# Patient Record
Sex: Female | Born: 1980 | Race: Black or African American | Hispanic: No | Marital: Married | Smoking: Never smoker
Health system: Southern US, Community
[De-identification: ages and names within clinical notes are randomized; demographics above are authoritative.]

## PROBLEM LIST (undated history)

## (undated) DIAGNOSIS — Z9889 Other specified postprocedural states: Secondary | ICD-10-CM

## (undated) DIAGNOSIS — R110 Nausea: Secondary | ICD-10-CM

## (undated) DIAGNOSIS — Q039 Congenital hydrocephalus, unspecified: Secondary | ICD-10-CM

## (undated) DIAGNOSIS — IMO0002 Reserved for concepts with insufficient information to code with codable children: Principal | ICD-10-CM

## (undated) DIAGNOSIS — D571 Sickle-cell disease without crisis: Secondary | ICD-10-CM

## (undated) DIAGNOSIS — R87619 Unspecified abnormal cytological findings in specimens from cervix uteri: Secondary | ICD-10-CM

## (undated) DIAGNOSIS — Z3490 Encounter for supervision of normal pregnancy, unspecified, unspecified trimester: Principal | ICD-10-CM

## (undated) DIAGNOSIS — Z86718 Personal history of other venous thrombosis and embolism: Secondary | ICD-10-CM

## (undated) DIAGNOSIS — K219 Gastro-esophageal reflux disease without esophagitis: Secondary | ICD-10-CM

## (undated) DIAGNOSIS — O26851 Spotting complicating pregnancy, first trimester: Secondary | ICD-10-CM

## (undated) DIAGNOSIS — D649 Anemia, unspecified: Secondary | ICD-10-CM

## (undated) DIAGNOSIS — R112 Nausea with vomiting, unspecified: Secondary | ICD-10-CM

## (undated) DIAGNOSIS — I10 Essential (primary) hypertension: Secondary | ICD-10-CM

## (undated) DIAGNOSIS — R87629 Unspecified abnormal cytological findings in specimens from vagina: Secondary | ICD-10-CM

## (undated) DIAGNOSIS — N809 Endometriosis, unspecified: Secondary | ICD-10-CM

## (undated) HISTORY — DX: Reserved for concepts with insufficient information to code with codable children: IMO0002

## (undated) HISTORY — DX: Unspecified abnormal cytological findings in specimens from vagina: R87.629

## (undated) HISTORY — DX: Unspecified abnormal cytological findings in specimens from cervix uteri: R87.619

## (undated) HISTORY — DX: Nausea: R11.0

## (undated) HISTORY — PX: CSF SHUNT: SHX92

## (undated) HISTORY — DX: Endometriosis, unspecified: N80.9

## (undated) HISTORY — DX: Encounter for supervision of normal pregnancy, unspecified, unspecified trimester: Z34.90

## (undated) HISTORY — DX: Sickle-cell disease without crisis: D57.1

## (undated) HISTORY — PX: BLADDER TUMOR EXCISION: SHX238

## (undated) HISTORY — DX: Spotting complicating pregnancy, first trimester: O26.851

## (undated) HISTORY — DX: Congenital hydrocephalus, unspecified: Q03.9

---

## 2004-06-13 ENCOUNTER — Ambulatory Visit (HOSPITAL_COMMUNITY): Admission: AD | Admit: 2004-06-13 | Discharge: 2004-06-13 | Payer: Self-pay | Admitting: Obstetrics & Gynecology

## 2004-08-05 ENCOUNTER — Ambulatory Visit (HOSPITAL_COMMUNITY): Admission: RE | Admit: 2004-08-05 | Discharge: 2004-08-05 | Payer: Self-pay | Admitting: Obstetrics & Gynecology

## 2004-08-11 ENCOUNTER — Ambulatory Visit: Payer: Self-pay | Admitting: *Deleted

## 2004-08-12 ENCOUNTER — Ambulatory Visit (HOSPITAL_COMMUNITY): Admission: RE | Admit: 2004-08-12 | Discharge: 2004-08-12 | Payer: Self-pay | Admitting: Obstetrics & Gynecology

## 2004-08-16 ENCOUNTER — Inpatient Hospital Stay (HOSPITAL_COMMUNITY): Admission: AD | Admit: 2004-08-16 | Discharge: 2004-08-19 | Payer: Self-pay | Admitting: Obstetrics and Gynecology

## 2006-10-12 DIAGNOSIS — Z86718 Personal history of other venous thrombosis and embolism: Secondary | ICD-10-CM

## 2006-10-12 HISTORY — DX: Personal history of other venous thrombosis and embolism: Z86.718

## 2007-03-19 ENCOUNTER — Emergency Department (HOSPITAL_COMMUNITY): Admission: EM | Admit: 2007-03-19 | Discharge: 2007-03-19 | Payer: Self-pay | Admitting: Emergency Medicine

## 2007-03-30 ENCOUNTER — Ambulatory Visit (HOSPITAL_COMMUNITY): Admission: RE | Admit: 2007-03-30 | Discharge: 2007-03-30 | Payer: Self-pay | Admitting: Obstetrics and Gynecology

## 2007-04-29 ENCOUNTER — Encounter (INDEPENDENT_AMBULATORY_CARE_PROVIDER_SITE_OTHER): Payer: Self-pay | Admitting: Urology

## 2007-04-29 ENCOUNTER — Observation Stay (HOSPITAL_COMMUNITY): Admission: RE | Admit: 2007-04-29 | Discharge: 2007-04-30 | Payer: Self-pay | Admitting: Urology

## 2007-05-04 ENCOUNTER — Emergency Department (HOSPITAL_COMMUNITY): Admission: EM | Admit: 2007-05-04 | Discharge: 2007-05-04 | Payer: Self-pay | Admitting: Emergency Medicine

## 2007-05-05 ENCOUNTER — Observation Stay (HOSPITAL_COMMUNITY): Admission: RE | Admit: 2007-05-05 | Discharge: 2007-05-06 | Payer: Self-pay | Admitting: Emergency Medicine

## 2007-05-07 ENCOUNTER — Inpatient Hospital Stay (HOSPITAL_COMMUNITY): Admission: EM | Admit: 2007-05-07 | Discharge: 2007-05-14 | Payer: Self-pay | Admitting: Emergency Medicine

## 2007-05-07 ENCOUNTER — Other Ambulatory Visit: Payer: Self-pay | Admitting: Emergency Medicine

## 2007-05-07 ENCOUNTER — Emergency Department (HOSPITAL_COMMUNITY): Admission: EM | Admit: 2007-05-07 | Discharge: 2007-05-07 | Payer: Self-pay | Admitting: Emergency Medicine

## 2007-05-20 ENCOUNTER — Observation Stay (HOSPITAL_COMMUNITY): Admission: EM | Admit: 2007-05-20 | Discharge: 2007-05-23 | Payer: Self-pay | Admitting: Emergency Medicine

## 2007-05-23 ENCOUNTER — Ambulatory Visit: Payer: Self-pay | Admitting: Vascular Surgery

## 2007-07-12 ENCOUNTER — Encounter (HOSPITAL_COMMUNITY): Admission: RE | Admit: 2007-07-12 | Discharge: 2007-07-12 | Payer: Self-pay | Admitting: Oncology

## 2007-07-12 ENCOUNTER — Ambulatory Visit (HOSPITAL_COMMUNITY): Payer: Self-pay | Admitting: Oncology

## 2007-09-12 ENCOUNTER — Ambulatory Visit (HOSPITAL_COMMUNITY): Payer: Self-pay | Admitting: Oncology

## 2007-09-12 ENCOUNTER — Encounter (HOSPITAL_COMMUNITY): Admission: RE | Admit: 2007-09-12 | Discharge: 2007-10-12 | Payer: Self-pay | Admitting: Oncology

## 2008-03-14 ENCOUNTER — Ambulatory Visit (HOSPITAL_COMMUNITY): Admission: RE | Admit: 2008-03-14 | Discharge: 2008-03-14 | Payer: Self-pay | Admitting: Obstetrics and Gynecology

## 2008-07-28 IMAGING — CT CT ABDOMEN W/ CM
2 of 5 series · 15 of 46 positions shown, 17 images · IV contrast (Omnipaque 300)
Comparison: none

HISTORY: Bladder mass, mid and lower pelvic pain, vomiting

[Series 2: abd_pel 5.0 b40f · axial · 0.78mm/px · z∈[-426,-40]mm · 12 of 93 slices shown, 14 images]
[im 8/93  soft-tissue]
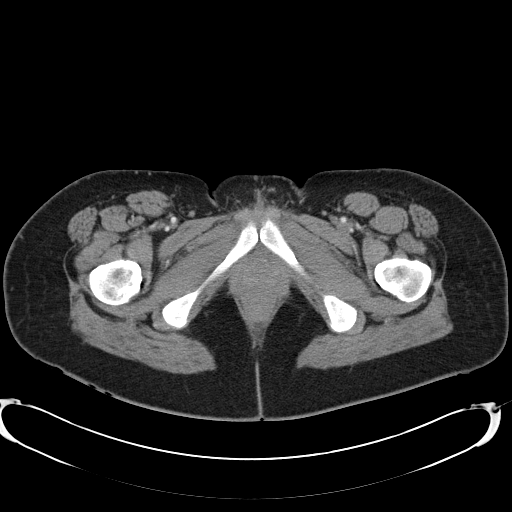
[im 8/93  bone]
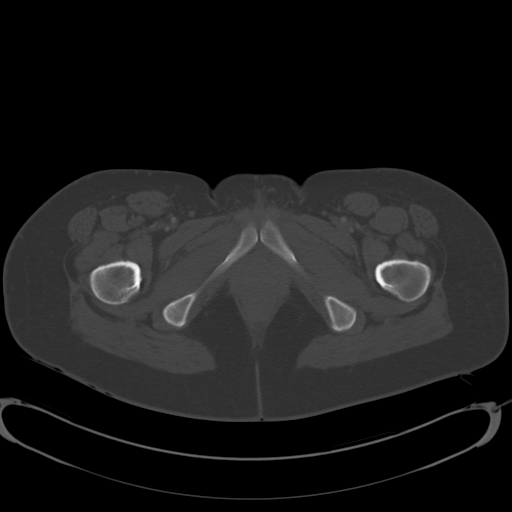
[im 15/93  soft-tissue]
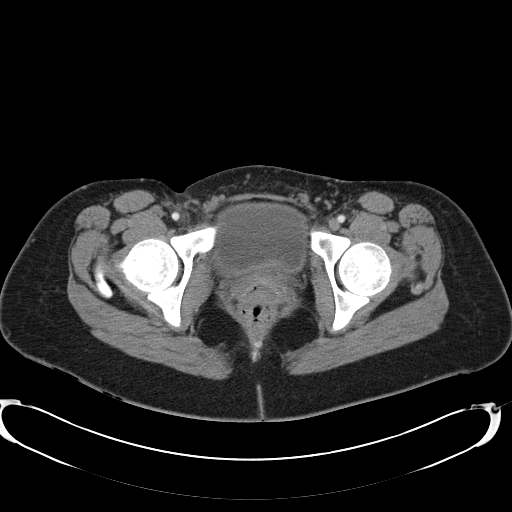
[im 22/93  soft-tissue]
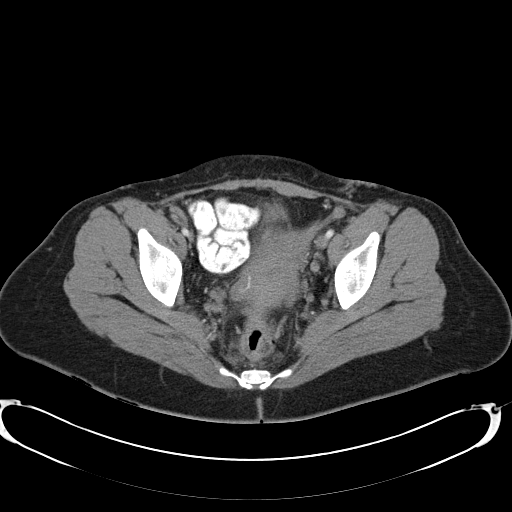
[im 29/93  soft-tissue]
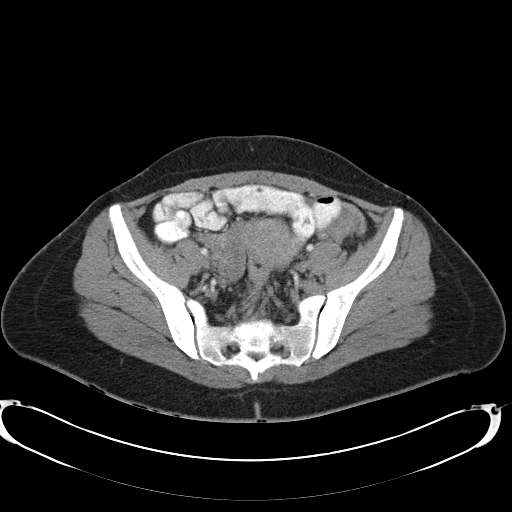
[im 36/93  soft-tissue]
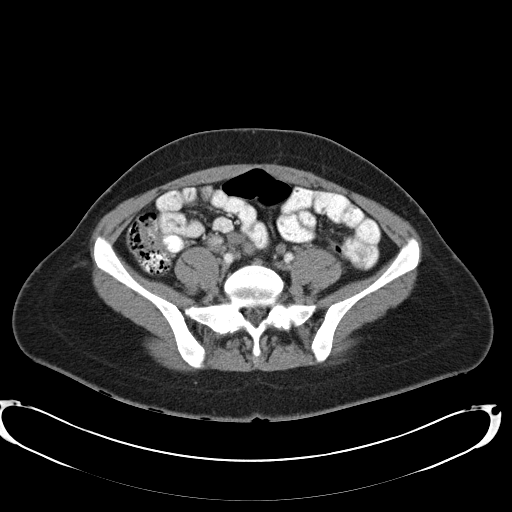
[im 43/93  soft-tissue]
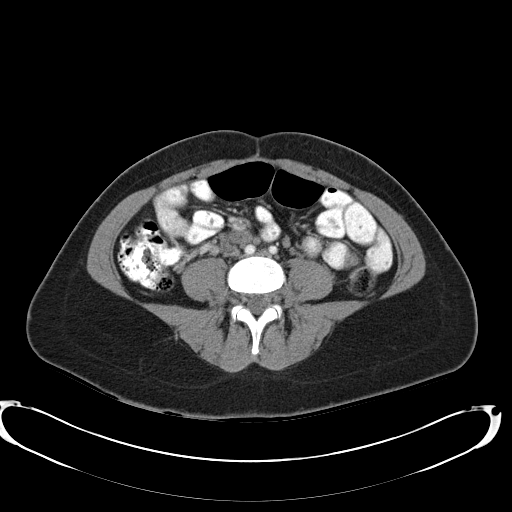
[im 50/93  soft-tissue]
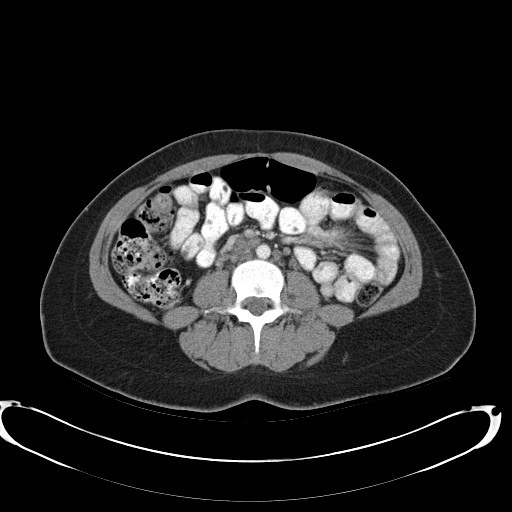
[im 57/93  soft-tissue]
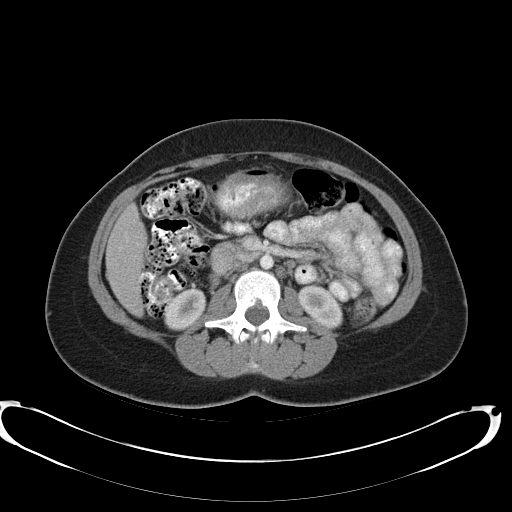
[im 64/93  soft-tissue]
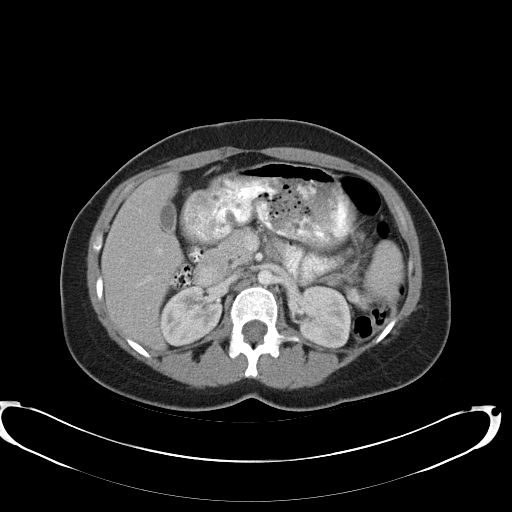
[im 64/93  bone]
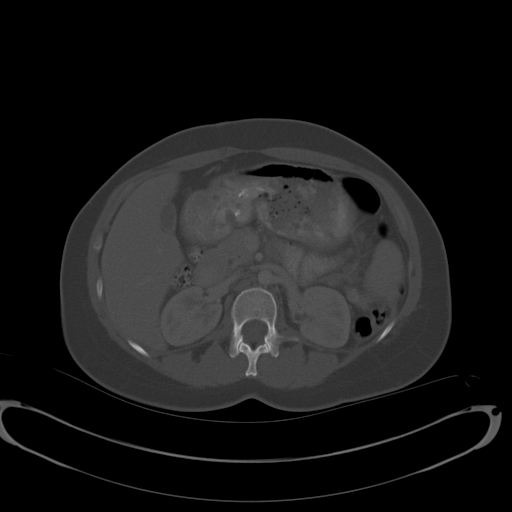
[im 71/93  soft-tissue]
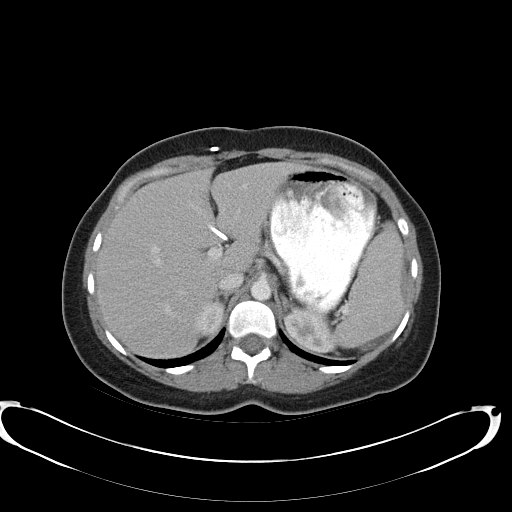
[im 78/93  soft-tissue]
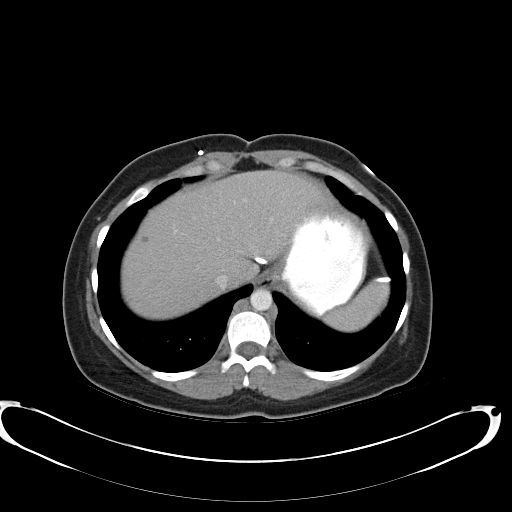
[im 85/93  soft-tissue]
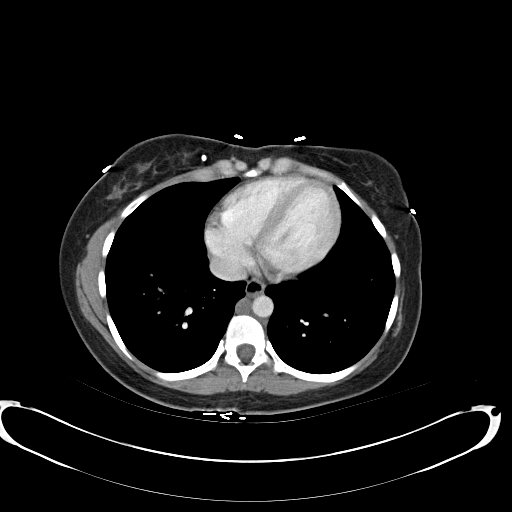

[Series 4: mpr coronal a/p · coronal · 0.62mm/px · 3 of 73 slices shown]
[im 25/73  soft-tissue]
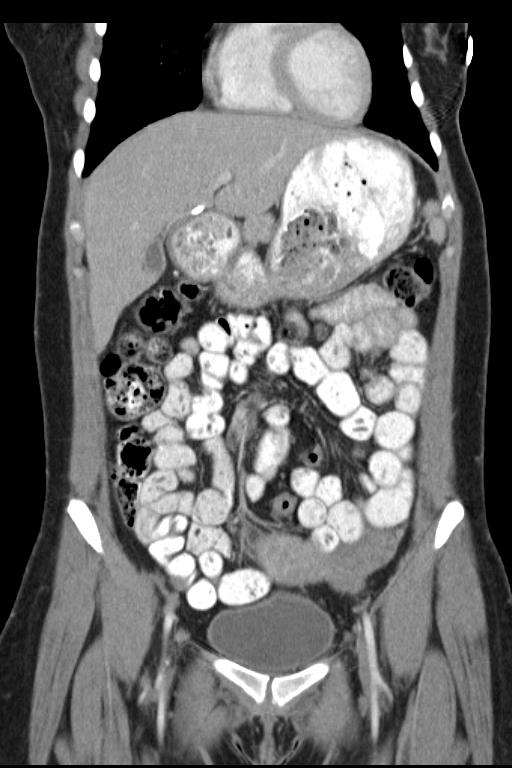
[im 33/73  soft-tissue]
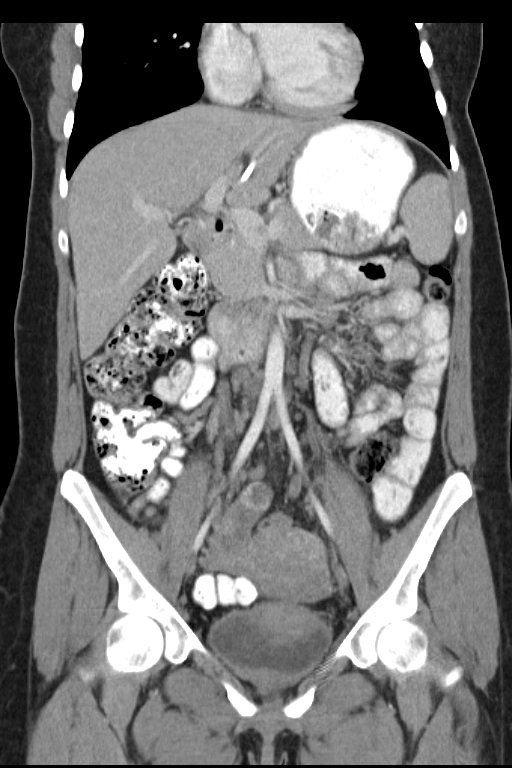
[im 41/73  soft-tissue]
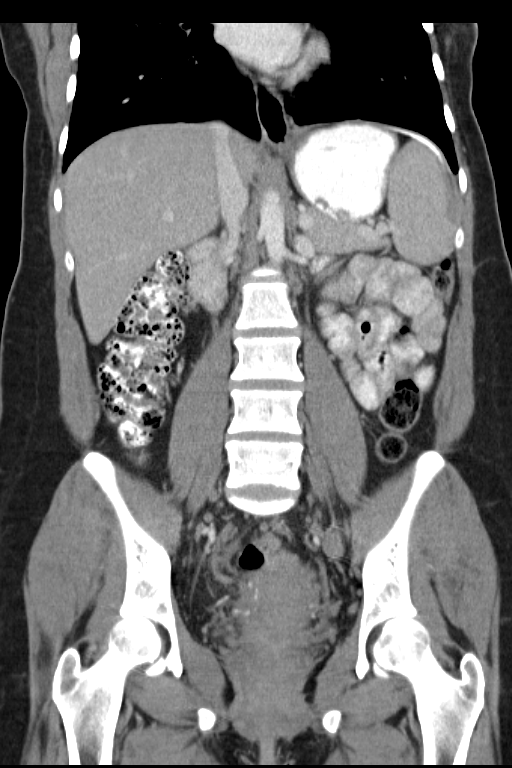

[15 of 46 positions shown; findings below may reference images not displayed]

CT ABDOMEN AND PELVIS WITH CONTRAST:

Multidetector helical CT imaging abdomen and pelvis performed.
Sagittal and coronal images are reconstructed from the axial data set.
Exam utilized dilute oral contrast and 100 cc 4mnipaque-Q66.
No prior exam for comparison.

CT ABDOMEN:

Tubing traverses right thorax extent and abdomen, terminating in left
subdiaphragmatic space, question VP shunt.
Lung bases clear.
Tiny nonspecific low attenuation focus liver, 5 mm diameter image 16, too small
to characterize.
Remainder of liver, spleen, pancreas, kidneys, and adrenal glands normal.
Question mild retrocrural adenopathy image 19.
Numerous but normal sized left periaortic and pericaval lymph nodes identified,
single node mildly enlarge 16 x 10 mm image 48.
No upper abdominal mass, free fluid, or inflammatory process.
Stomach and upper abdominal bowel loops unremarkable.
IMPRESSION: Prominent retroperitoneal and suspect retrocrural nodes, see below.
No other upper abdominal abnormalities, see below.

CT PELVIS:

Ovaries mildly prominent in size bilaterally.
Ring-enhancing lesion in right ovary 2.2 x 2.4 cm, likely ruptured follicle
cyst.
Minimal prominence of uterus.
Soft tissue mass identified at posterior aspect of urinary bladder, superior to
mid portions, measuring 3.5 x 3.2 x 2.6 cm.
On delayed images of the bladder, mass has irregular papillary projections or
markedly irregular contour extending into bladder lumen.
Mass appears to have an epicenter at posterior wall of bladder.
No free pelvic fluid.
Few prominent lymph nodes, including left iliac node 18 x 15 mm image 66
anterior to SI joint.
Right paracaval adenopathy extends inferiorly in pelvis to the right ovary.
Mildly prominent inguinal vessels without discrete inguinal adenopathy.
Pelvic large and small bowel loops unremarkable. 
Appendix appears normal. 
No bone lesions.
IMPRESSION: Posterior bladder wall mass 3.5 cm in greatest size, which extends intraluminal
at posterior bladder.
Favor this representing transitional cell carcinoma of the urinary bladder.
The presence of adenopathy in retroperitoneum and left pelvis makes
endometriosis less likely.
Other etiologies would include a stromal tumor of uterus, which is contiguous
with this mass, though the epicenter does not appear to be uterine I think this
is unlikely.
No definite features seen to suggest an ovarian carcinoma.
Tissue diagnosis recommended.

## 2009-07-13 ENCOUNTER — Inpatient Hospital Stay (HOSPITAL_COMMUNITY): Admission: AD | Admit: 2009-07-13 | Discharge: 2009-07-13 | Payer: Self-pay | Admitting: Obstetrics & Gynecology

## 2010-11-02 ENCOUNTER — Encounter: Payer: Self-pay | Admitting: Family Medicine

## 2011-01-15 LAB — WET PREP, GENITAL: Clue Cells Wet Prep HPF POC: NONE SEEN

## 2011-01-15 LAB — POCT PREGNANCY, URINE: Preg Test, Ur: NEGATIVE

## 2011-02-24 NOTE — Group Therapy Note (Signed)
NAMEESRAA, SERES    ACCOUNT NO.:  [id number]   MEDICAL RECORD NO.:  [id number]          PATIENT TYPE:  INP   LOCATION:  A302                          FACILITY:  [facility]   PHYSICIAN:  Dorris Singh, DO    DATE OF BIRTH:  1981/02/01   DATE OF PROCEDURE:  05/11/2007  DATE OF DISCHARGE:                                 PROGRESS NOTE   The patient is a 30 year old African-American female who presented with  a history of hematuria after having a bladder tumor removed.  The  patient stated prior to this she had several days of resolved hematuria  and early last evening she started to have increasing episodes of  hematuria and now today after they placed a Foley they noted that now  she has continual hematuria.  She denies any nausea, vomiting, shortness  of breath or any chest pain or headache.   PHYSICAL EXAMINATION:  VITALS:  Temperature 98.8, pulse 68, respirations  20, blood pressure 103/62.  GENERAL:  This is a 30 year old female who is well-nourished, well-  developed in no acute distress. Answers questions appropriately.  HEART:  Regular rate and rhythm.  No murmur or gallop or rub noted.  S1  and S2 appreciated.  LUNGS:  Clear to auscultation bilaterally.  Respirations are  symmetrical.  No wheezes, rales or rhonchi.  ABDOMEN:  Soft, nontender, nondistended.  Appropriate tenderness over  bladder region.  EXTREMITIES:  Positive pulses.  No ecchymoses, cyanosis or edema noted.   LABORATORY DATA:  A CBC today; WBC is 7.9, hemoglobin 13.1, hematocrit  38.4, platelets 280 and her INR is 1.4.   ASSESSMENT/PLAN:  Hematuria that has recurred.  Spoke with Dr. Dennie Maizes today regarding her recurrence.  Surgeon will come and see the  patient today. Also will hold all anticoagulation therapy until she is  seen and actually await further recommendations via urology for any  further treatment options.      Dorris Singh, DO  Electronically Signed     CB/MEDQ  D:   05/11/2007  T:  05/12/2007  Job:  [id number]

## 2011-02-24 NOTE — Consult Note (Signed)
Sarah Cross, Sarah Cross    ACCOUNT NO.:  [id number]   MEDICAL RECORD NO.:  [id number]          PATIENT TYPE:  INP   LOCATION:  A302                          FACILITY:  [facility]   PHYSICIAN:  Dennie Maizes, M.D.   DATE OF BIRTH:  November 27, 1980   DATE OF CONSULTATION:  05/07/2007  DATE OF DISCHARGE:                                 CONSULTATION   REASON FOR CONSULTATION:  Hematuria.   CONSULTATION REPORT:  This 30 year old female has been under the care of  Dr.Javaid.  She is diagnosed with a bladder tumor.  A TUR of a bladder  tumor was done on April 29, 2007.  The pathology revealed endometriosis.  There was no evidence of malignancy.  The patient came to the emergency  room on May 05, 2007 with right leg pain.  Evaluation revealed deep  venous thrombosis.  The patient was started on Lovenox,  and she has  been admitted to the hospital.  She was discharged from the hospital  yesterday.  She returned to the hospital today complaining of hematuria  and passing blood clots in the urine.  Denies voiding difficulty, flank  pain, fever, chills or dysuria.  She has been admitted to the hospital  by the Hospitalist.  I was asked to see the patient regarding the  hematuria.   PAST MEDICAL HISTORY:  Multiple surgeries. ; C-section. Status post TUR  of bladder tumor on April 29, 2007.  </MEDICATIONS Coumadin.   ALLERGIES:  None.   PHYSICAL EXAMINATION:  ABDOMEN:  Soft, no palpable masses, tender,  bladder is not palpable.   ADMISSION LABS:  BUN 7, creatinine 0.79. PTT 30.  CBC:  WBC 7.1  thousand, hemoglobin 15.8, hematocrit 43.9.  Urinalysis:  blood large,  nitrate negative, trace leukocyte estrace; microscopic wbc's 7-10/hpf,  rbc's too numerous to count, bacteria few.   IMPRESSION:  1. Hematuria probably secondary to anticoagulation.  2. Bladder tumor.  3. Endometriosis, status post TUR of bladder tumor.   PLAN:  1. Urine culture and sensitivity today.  2. Start patient on IV  heparin.  3. If patient develops a voiding difficulty, she may need      catheterization and bladder irrigation.   Thanks for this consult.      Dennie Maizes, M.D.  Electronically Signed     SK/MEDQ  D:  05/07/2007  T:  05/07/2007  Job:  [id number]   cc:   Corrie Mckusick, M.D.  Fax: [phone]

## 2011-02-24 NOTE — Discharge Summary (Signed)
NAMEJELENA, MALICOAT NO.:  [id number]   MEDICAL RECORD NO.:  [id number]          PATIENT TYPE:  OBV   LOCATION:  5033                         FACILITY:  [facility]   PHYSICIAN:  Hillery Aldo, M.D.   DATE OF BIRTH:  [birth year]   DATE OF ADMISSION:  05/20/2007  DATE OF DISCHARGE:  05/23/2007                               DISCHARGE SUMMARY   PRIMARY CARE PHYSICIAN:  Corrie Mckusick, M.D., with the [facility] in [place].   UROLOGIST:  Ky Barban, M.D.   DISCHARGE DIAGNOSES:  1. 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 Hematuria secondary to bladder hemorrhage in the setting of      anticoagulation, resolved off anticoagulation.  3. Endometriosis.  4. Hydrocephalus with chronic ventriculoperitoneal shunt.   DISCHARGE MEDICATIONS:  None.   CONSULTATIONS:  Janetta Hora. Fields, MD, vascular surgery.   BRIEF ADMISSION HPI:  For the full details, please see my dictated  report.  Briefly, this is a 30 year old female who presented to the  hospital for consideration of placement of a Greenfield filter, after a  DVT was discovered post surgically.  The patient had a bladder mass  resected back in July 2008.  She failed outpatient as well as inpatient  treatment with anticoagulation, secondary to recurrent bladder  hemorrhages.  Her primary care physician had her sent to [facility] for evaluation of possible placement of a Greenfield filter.   PROCEDURES AND DIAGNOSTIC STUDIES:  1. Venogram on May 22, 2007 showed chronic occlusion of the IVC,      with a right lower extremity DVT.  There is no need for IVC filter      placement.  2. CT scan of the chest, abdomen and pelvis on May 22, 2007 showed      a prominent azygous system.  There was no obvious IVC clot in the      chest or upper abdomen.  There were no acute pulmonary findings.      There was no  adenopathy.  There was an extensive network of      collateral vasculature getting around the IVC occlusion, with no      masses or adenopathy in the abdomen.  A ventriculoperitoneal shunt      catheter was noted in the abdomen.  Findings in the pelvis included      an abnormal enhancement of the posterior bladder wall.  This is a      somewhat worrisome finding and a follow-up cystoscopic evaluation      was recommended.  This specific finding was discussed with the      radiologist, who did indicate that endometriosis could have this      appearance.  Nevertheless, he did recommend follow-up cystoscopy at      some point in the future.  There was a simple appearing right renal      cyst and a more solid appearing lateral right probable ovarian      lesion.  Further characterization can be accomplished with an  outpatient MRI of the pelvis, if indicated.  There was extensive      collateral vessels because of the IVC iliac and right femoral      occlusions.  There was no obstructing mass or adenopathy noted.   DISCHARGE LABORATORY VALUES:  At the time of this dictation, a  hypercoagulability panel is pending.  Her homocystine level was within  normal limits.  Her thyroid stimulating hormone level was within normal  limits.   HOSPITAL COURSE BY PROBLEM:  PROBLEM #1 - CHRONIC DEEP VENOUS  THROMBOSIS, AFFECTING THE INFERIOR VENA CAVA AND FEMORAL VESSELS:  The  patient did undergo a venogram study to determine if placement of an IVC  filter would be feasible.  Unfortunately, there was extensive clot  affecting the entire IVC, and no indication for filter was found due to  his extensive clot.  Dr. Darrick Penna of vascular surgery was asked to review  the films and the venogram findings, to determine if any further course  of action was necessary.  The patient is not a candidate for recurrent  anticoagulation at this time, due to recurrent bladder hemorrhages.  A  thrombectomy also is not  indicated due to the extensive nature of the  clot and the fact that she has extensive collateral vessels, and that  this clot is likely stable.  Consideration for rechallenging with  outpatient anticoagulation can be done at a later date.   PROBLEM #2 - HEMATURIA SECONDARY TO BLADDER HEMORRHAGE IN THE SETTING OF  ANTICOAGULATION:  This is completely resolved off all anticoagulation.   PROBLEM #3 - ENDOMETRIOSIS WITH ABNORMAL ENHANCEMENT OF THE POSTERIOR  BLADDER WALL:  (seen on CT) We do recommend a follow-up cystoscopy as an  outpatient.   PROBLEM #4 -  RIGHT OVARIAN LESION:  Would recommend follow-up in 3-6  months of this lesion to ensure stability   PROBLEM #5 -  HYDROCEPHALUS WITH VENTRICULOPERITONEAL SHUNT:  Chronic  problem.  No active issues related to this.   DISPOSITION:  The patient is stable for discharge home.  She should  follow up with her primary care physician and be referred back to her  urologist for cystoscopy as an outpatient      Hillery Aldo, M.D.  Electronically Signed     CR/MEDQ  D:  05/23/2007  T:  05/24/2007  Job:  [id number]   cc:   Corrie Mckusick, M.D.  Ky Barban, M.D.

## 2011-02-24 NOTE — H&P (Signed)
Sarah Cross, Sarah Cross   ACCOUNT NO.:  [id number]   MEDICAL RECORD NO.:  [id number]          PATIENT TYPE:  AMB   LOCATION:  DAY                           FACILITY:  [facility]   PHYSICIAN:  Ky Barban, M.D.DATE OF BIRTH:  [birth year]   DATE OF ADMISSION:  DATE OF DISCHARGE:  LH                              HISTORY & PHYSICAL   CHIEF COMPLAINT:  Bladder tumor.   HISTORY OF PRESENT ILLNESS:  The patient is a 30 year old female who was  having dysuria during her menstrual period and she went to see Dr.  Emelda Fear who did a pelvic ultrasound. There is a mass in the bladder. A  CT scan showed there is a 3.5 x 3.2 cm mass in the posterior aspect of  the urinary bladder. Cystoscopy was recommended and on cystoscopy there  was a solid papillary growth in the bladder on the posterior bladder  wall. It looks like transitional-cell carcinoma. I have advised the  patient to undergo TUR bladder tumor. The procedure, its risks,  complications, especially bladder perforation, injury to the bowel and  need for additional surgery were discussed in detail. They understand  and want me to go ahead and schedule.   PAST MEDICAL HISTORY:  She has ventriculoperitoneal shunt. According to  the family it was done when she was born. The second shunt was placed  when she was age 30. She denies having gross hematuria but does have  dysuria when she is having menstrual period.   PAST SURGICAL HISTORY:  She had Cesarean section 2005. Also had  ventriculoperitoneal shunt done twice; first time at birth and the  second time at age 32.   MEDICATIONS:  Hydrocodone.   REASON FOR ADMISSION:  Unremarkable.   PHYSICAL EXAMINATION:  GENERAL APPEARANCE: Moderately-built female. Not  a good historian.  VITAL SIGNS: Blood pressure is 128/86, temperature is 98.6.  CENTRAL NERVOUS SYSTEM: No gross neurological deficit.  HEENT: Head and neck are essentially negative.  CHEST: Symmetrical.  CARDIAC: Regular  sinus rhythm, no murmur.  ABDOMEN: Soft, flat, liver, spleen, kidneys are not palpable. No CVA  tenderness.  PELVIC: On exam, no adnexal mass.  There is some tenderness anteriorly  in the area of the bladder.   IMPRESSION:  Bladder tumor.   PLAN:  TUR of bladder tumor under anesthesia, and then keep her for  observation.      Ky Barban, M.D.  Electronically Signed     MIJ/MEDQ  D:  04/28/2007  T:  04/28/2007  Job:  [id number]   cc:   Tilda Burrow, M.D.  Fax: [phone]

## 2011-02-24 NOTE — H&P (Signed)
Sarah Cross, Sarah Cross   ACCOUNT NO.:  [id number]   MEDICAL RECORD NO.:  [id number]          PATIENT TYPE:  OBV   LOCATION:  5033                         FACILITY:  [facility]   PHYSICIAN:  Hillery Aldo, M.D.   DATE OF BIRTH:  Sep 14, 1981   DATE OF ADMISSION:  05/20/2007  DATE OF DISCHARGE:                              HISTORY & PHYSICAL   PRIMARY CARE PHYSICIAN:  Dr. Phillips Odor with [facility] in  [place].   CHIEF COMPLAINT:  Leg pain.   HISTORY OF PRESENT ILLNESS:  The patient is a 30 year old female whose  history of present illness dates back to April 29, 2007, when she was  admitted for an elective bladder resection secondary to evaluation of a  bladder mass.  The pathology on the tumor subsequently revealed  endometriosis.  She was discharged after her surgery, and approximately  2 days later, developed right lower extremity swelling and pain.  A  Doppler ultrasound did reveal a deep venous thrombosis, and she was  therefore put on therapeutic anticoagulation with Lovenox and Coumadin.  Unfortunately, the patient did have an excessive dose of Lovenox given  to her, due to a clerical error and transcribing her weight as being 135  kg instead of pounds.  She was discharged and advised to skip a dose of  Lovenox but subsequently developed significant hematuria.  She  represented to the hospital and was admitted, and attempts to  rechallenge her with heparin induced recurrent episodes of hematuria.  She was ultimately discharged from the hospital on May 15, 2007, off  of oral therapeutic anticoagulation.  She was instructed to follow-up  with her primary care physician, who advised her to come to the [facility] emergency department for further evaluation.  Upon presentation  here, her main complaint was ongoing right lower extremity pain, and  there was apparently some mention of placement of Greenfield filter by  her primary care physician.  The interventional  radiologist was  subsequently contacted by the emergency department physician who  reviewed her CT scan, and ultrasound studies and he was not completely  sure if it would be technically feasible to place a Greenfield filter in  without first obtaining a venogram due to concerns of occlusion of the  IVC.  The patient therefore is being admitted to sort these issues out  with a scheduled venogram in the morning and subsequent consideration of  a Greenfield filter placement if technically feasible.   PAST MEDICAL HISTORY:  1. Placement of continuous lumbar epidural analgesia, L2-L3 interspace      of November 2005.  2. Anxiety.  3. History of premature birth weighing 3 pounds at birth with      prolonged antenatal hospitalization and multiple procedures      including placement of a VP shunt.  4. Revision of the VP shunt at age 64.  5. Hydrocephalus.  6. Status post bladder resection with subsequent diagnosis of      endometriosis.  7. Status post cesarean section in 2005.   FAMILY HISTORY:  The patient's mother died at 59 of lung cancer.  She  was a tobacco user.  The patient's father is alive  and is age 23.  He  has hypertension.  She has half siblings who are healthy.  Her son is  healthy.  There is no family history of blood clots in the immediate  family, but she did have a maternal grandmother who suffered with a  blood clot, during a prolonged hospitalization.   SOCIAL HISTORY:  The patient is married and has one son.  She is a  lifelong nonsmoker.  She denies tobacco or alcohol use.  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   ALLERGIES:  NONE.   CURRENT MEDICATIONS:  None.  Specifically, she denies any use of hormone  treatment or birth control.   REVIEW OF SYSTEMS:  The patient denies any fever or chills.  She denies  shortness of breath, cough, chest pain.  She denies any changes in her  bowel habits, melena or hematochezia.  She denies any current  hematuria.  Her main complaints are as per the HPI, with regard to right lower  extremity pain and swelling.   PHYSICAL EXAM:  VITAL SIGNS:  Temperature 98.5, pulse 72, respirations  18, blood pressure 110/75, O2 saturation 100% on room air.  GENERAL:  This is an anxious female who is in no acute distress.  HEENT:  Normocephalic, atraumatic.  PERRL.  EOMI.  Oropharynx is clear.  NECK:  Supple, no thyromegaly, no lymphadenopathy, no jugular venous  distension.  CHEST:  Lungs clear to auscultation bilaterally with good air movement.  HEART:  Regular rate, rhythm.  No murmurs, rubs, or gallops.  ABDOMEN:  Soft, nontender, nondistended with normoactive bowel sounds.  EXTREMITIES:  The patient has some swelling to the right lower  extremity, when compared to the left.  SKIN:  Warm and dry.  No rashes.  NEUROLOGIC:  Nonfocal.   DATA REVIEW:  No new labs have been obtained, but a CBC on May 14, 2007 did reveal a hemoglobin of 12.6, hematocrit of 36.5, white blood  cell count of 7.6, and platelet count 291.   ASSESSMENT/PLAN:  1. Right lower extremity deep venous thrombosis with recurrent      hematuria on anticoagulation:  The patient is currently scheduled      for a venogram in the morning, to see if it would be technically      feasible to have a Greenfield filter placed.  At this point, given      her recurrent episodes of severe hematuria, I will defer      anticoagulation.  It might be prudent to obtain a      hypercoagulability screen on this patient, as a long-term      management of clot risk will need to be addressed..  2. Prophylaxis:  Will initiate GI prophylaxis with Protonix.  The      patient has an active deep venous thrombosis, and therefore      prevention is not possible.      Hillery Aldo, M.D.  Electronically Signed     CR/MEDQ  D:  05/20/2007  T:  05/22/2007  Job:  [id number]   cc:   Corrie Mckusick, M.D.

## 2011-02-24 NOTE — H&P (Signed)
Sarah Cross, LEEVER NO.:  [id number]   MEDICAL RECORD NO.:  [id number]          PATIENT TYPE:  EMS   LOCATION:  ED                            FACILITY:  [facility]   PHYSICIAN:  Osvaldo Shipper, MD     DATE OF BIRTH:  June 01, 1981   DATE OF ADMISSION:  05/07/2007  DATE OF DISCHARGE:  LH                              HISTORY & PHYSICAL   PRIMARY MEDICAL DOCTOR:  Dr. Assunta Found.   ADMITTING DIAGNOSES:  1. Hematuria.  2. Recent acute deep venous thrombosis.  3. On anticoagulation.   CHIEF COMPLAINT:  Blood in the urine.   HISTORY OF PRESENTING ILLNESS:  The patient is a 30 year old, African-  American female, who was actually discharged just yesterday after she  was initiated on treatment for a DVT of the right lower extremity.  As  noted in the Discharge Summary from yesterday, she was inadvertently  given a higher than usual dose of Lovenox because of clinical error  regarding her weight.  The patient was asked to skip last night's dose,  which she did.  The patient came into the ED last night because she  started noticing blood in her urine yesterday night at about 8 o'clock.  Patient was discharged home from the ED after a PT-INR was checked,  which was normal, and a UA did show large blood and numerous RBCs.  I  got a call from the patient this morning saying that she was having  frank hematuria with blood clots from the urine.  Denied any vaginal  bleeding.  She denied any pain, nausea, vomiting, no chest pain, no  shortness of breath.  She denied any pain in her right leg as well.  It  should be noted that patient underwent a urological procedure about a  week ago done by Dr. Jerre Simon and had the removal of a bladder tumor.   DISCHARGE HOME MEDICATIONS:  1. Lovenox 65 mg q.12.  2. Coumadin 5 mg daily.  As mentioned above, she has not taken the Lovenox since she has been  home as she was instructed.   ALLERGIES:  No known drug allergies.   PAST MEDICAL  HISTORY:  1. Multiple surgeries as a child.  She has had a repeat shunt placed      as a child and numerous other surgeries.  2. History of C-section.  3. History of bladder tumor removal about eight days ago.  No history of any other medical problems.   SOCIAL HISTORY:  Lives in [place] with her husband and children,  works in Education officer, museum, denies smoking, alcohol, or illicit drug  use.   FAMILY HISTORY:  Positive for hypertension, father lung cancer, mother a  history of diabetes and end-stage renal disease in the family.   REVIEW OF SYSTEMS:  No change from that noted in the H&P done July 24th  except for this recent hematuria.   PHYSICAL EXAMINATION:  VITAL SIGNS:  Temperature 97.9, blood pressure  113/70 going a little bit down to 96/46, heart rate in the 70s, a  regular respiratory rate at 16, saturation 100% on room air.  GENERAL:  This is a  well-developed, well-nourished individual in no  distress, slightly anxious.  HEENT:  There is no pallor, no icterus, oral mucous membrane is moist,  no oral lesions are noted, clear.  NECK:  Soft and supple, no thyromegaly is appreciated.  LUNGS:  Clear to auscultation bilaterally.  No wheezing, rales, or  rhonchi.  CARDIOVASCULAR:  S1 and S2 is normal, regular, with no murmurs  appreciated.  ABDOMEN:  Soft, nontender, and nondistended, no suprapubic tenderness is  present.  No mass or organomegaly is appreciated.  EXTREMITIES:  No edema, peripheral pulses are palpable.   LABORATORY DATA:  CBC shows a normal hemoglobin, otherwise unremarkable,  PT-INR normal, CMET is normal, urine did show a small bilirubin, large  blood, numerous RBCs, trace leukocytes, 7 to 10 WBCs are also seen.   No imaging studies have been done.   ASSESSMENT AND PLAN:  This is a 30 year old, African-American female  with medical problems as stated earlier, who was recently diagnosed with  a deep venous thrombosis, was initiated on treatment the day  before  yesterday.  She presents now with frank hematuria.  A couple of issues  in this case merit inpatient observation of this patient.  One is that  she needs to be on anticoagulation for this deep venous thrombosis, and  number two, this is compounded by the fact that she is having active  bleeding.  It is unclear if the high dose of Lovenox that she received  yesterday morning has any role to play in this current episode of  hematuria.  It is very unlikely as the half-life is about seven or eight  hours.   PLAN:  1. Hematuria.  I have already spoken to Dr. Dennie Maizes, who is on      call for Dr. Jerre Simon.  He will come in and evaluate this patient      especially so because of the recent urological procedure.  2. Acute DVT.  I am waiting on an anti-factor 10A activity level now.      Depending on that, I think I will proceed and start the patient on      IV heparin with which we will get better control if there does      appear to be a significant      bleeding.  In the meantime, I will wait for Dr. Dennie Maizes to      come in and evaluate this patient.  Depending on what he wants to      do, I will discuss with him initiation of IV heparin.   Further management decision will be based on results of initial testing  and patient's response to treatment.      Osvaldo Shipper, MD  Electronically Signed     GK/MEDQ  D:  05/07/2007  T:  05/07/2007  Job:  [id number]   cc:   Corrie Mckusick, M.D.  Fax: [phone]   Ky Barban, M.D.  Fax: [phone]

## 2011-02-24 NOTE — Discharge Summary (Signed)
NAMEJENNILEE, Sarah Cross    ACCOUNT NO.:  [id number]   MEDICAL RECORD NO.:  [id number]          PATIENT TYPE:  INP   LOCATION:  A302                          FACILITY:  [facility]   PHYSICIAN:  Marcello Moores, MD   DATE OF BIRTH:  [birth year]   DATE OF ADMISSION:  05/07/2007  DATE OF DISCHARGE:  08/02/2008LH                               DISCHARGE SUMMARY   PRIMARY MEDICAL DOCTOR:  Dr. Assunta Found.   DISCHARGE DIAGNOSIS:  1. Recurrent hematuria, resolved.  2. Recent deep venous thrombosis, anticoagulation on hold because of      repeated bleeding.  3. Bladder tumor, status post status post bladder tumor removal 2      weeks ago.   HOME MEDICATIONS:  She was on Coumadin; it is on hold because of the  recurrent and much profuse hematuria and she will discuss with her PMD  in the coming 2-3 days whether to restart it or not.Marland Kitchen   HOSPITAL COURSE:  She is a 30 year old female patient with the above  medical problems, who presented on July 26 with hematuria and she was  admitted to the hospital.  Coumadin was on hold and she was put on  heparin drip, but her hematuria persisted and became profuse and heparin  was stopped and it was restarted again.  She developed another  continuous hematuria, profuse, and after discussion with the urologist,  heparin as well as Coumadin were stopped.  As it was dictated in the  admission note, the patient has been diagnosed on July 24 with deep  venous thrombosis after ultrasound of the lower extremity was done and  she was discharged with Coumadin.  She came to the hospital with  hematuria after 2 days.  The patient had bladder tumor removal recently,  which, according to the pathology result, it is endometriosis and benign  urothelial mucosa without signs of malignancy.  So, after stopping  heparin, her urine cleared on the third day and the patient remains  stable and the situation was discussed with the patient and with her  husband in detail  about the consequence of Coumadin with bleeding and  about the consequence of DVT in relation to possibility of PE and the  patient was discharged without Coumadin to have followup on the coming  Monday, July 4, with her PMD and to discuss whether to continue Coumadin  or not and for the possibility of Greenfield filter.  Otherwise, the  patient during discharge was stable.  Temperature is 98.3 and pulse 76,  respiratory rate 18 and blood pressure is 100/58.  She has pink  conjunctivae, anicteric sclerae, moist buccal mucosa.  Neck is supple.  Chest:  Good air entry bilaterally.  CV:  S1 and S2 regular, no murmur.  Abdomen is soft, no area of tenderness.  Extremities:  She has slight  tenderness on the right thigh; otherwise, there was no visible redness  or swelling.   LABORATORY DATA:  During the discharge, white blood cell count was 7.6  and hemoglobin was 12.6, hematocrit 36.5 and platelet count is 291,000.  PT is 15.4 and INR is 1.22.  Factor X activity was within normal range.  DISCHARGE PLAN:  The patient was discharged on August 2 to have followup  with her PMD on August 4 with clear, strong advise to her and her  husband to go to primary doctor, Dr. Assunta Found, and discuss about the  possibility of restarting Coumadin and the possibility of a placement of  Greenfield filter.  Otherwise, the patient during discharge had no  bleeding and she agreed and her husband was also aware and discharged  stable.   DISCHARGE PROCESS:  The time for this discharge was above 35 units.      Marcello Moores, MD  Electronically Signed     MT/MEDQ  D:  05/15/2007  T:  05/16/2007  Job:  [id number]   cc:   Corrie Mckusick, M.D.  Fax: [phone]

## 2011-02-24 NOTE — H&P (Signed)
Sarah Cross, BARUA NO.:  [id number]   MEDICAL RECORD NO.:  [id number]          PATIENT TYPE:  INP   LOCATION:  A309                          FACILITY:  [facility]   PHYSICIAN:  Osvaldo Shipper, MD     DATE OF BIRTH:  November 14, 1980   DATE OF ADMISSION:  05/05/2007  DATE OF DISCHARGE:  LH                              HISTORY & PHYSICAL   PRIMARY CARE PHYSICIAN:  Dr. Assunta Found, from [facility].   ADMITTING DIAGNOSIS:  Right deep venous thrombosis.   CHIEF COMPLAINT:  Right leg pain.   HISTORY OF PRESENT ILLNESS:  Patient is a 30 year old African-American  female who was in her usual state of health until this past weekend when  she started noticing numbness in her right foot which was followed by  onset of pain which radiated up superiorly and she started feeling pain  in the right thigh region.  The pain got up to 8/10.  She went to her  doctor and was sent over for an ultrasound of the leg which showed a  DVT.  Patient underwent bladder tumor removal on April 29, 2007, that was  done by Dr. Jerre Simon.  She denies any similar episodes of DVTs in the  patient.  There is one history of blood clot in her grandmother at the  age of 18, which happened after a prolonged hospital stay.  The patient  denied any chest pain or shortness of breath.   MEDICATIONS AT HOME:  She is just on Tylenol and Motrin p.r.n.   PAST MEDICAL HISTORY:  1. She was apparently a premature baby when she was born and had to      undergo multiple surgeries as a result.  2. She had a VP shunt placed as a child and had multiple procedures      done as well as a child.  3. She has had a C-section, since then she has had this bladder tumor      removal which on pathology appears to have evidence of      endometriosis and benign urothelial mucosa with reactive changes.   Denies any diabetes, hypertension, strokes, heart disease, any other  lung disease in the past.   SOCIAL HISTORY:  1. He  lives in [place].  2. Works in childcare.  3. Denies smoking, alcohol or illicit drug use.   FAMILY HISTORY:  Positive for:  1. Hypertension in the father.  2. Lung cancer in the mother, who passed away because of the lung      cancer.  3. History of diabetes and end-stage renal disease in the family as      well.   REVIEW OF SYSTEMS:  GENERAL SYSTEM:  Unremarkable.  HEENT:  Unremarkable.  CARDIOVASCULAR:  Unremarkable.  RESPIRATORY:  Unremarkable for chest pain or shortness of breath.  GI:  Unremarkable.  GU:  As in HPI.  GYN:  Positive for heavy menstrual bleeding during her  menstrual cycle.  PSYCHIATRIC:  Unremarkable.  DERMATOLOGIC:  Unremarkable.  EXTREMITIES:  Positive for pain in the right leg.   PHYSICAL EXAMINATION:  VITAL SIGNS:  Temperature is 97.8, blood pressure  108/69,  heart rate 70's, respiratory rate is 18, saturations are not  recorded, in the ED she was at 100% on room air.  GENERAL EXAM:  This is a well-developed, well-nourished individual in no  distress.  HEENT:  There is no pallor, no icterus.  Oral mucous membranes moist, no  oral lesions are noted.  NECK:  Soft and supple, no thyromegaly is appreciated.  LUNGS:  Clear to auscultation bilaterally; no wheezing, rales or  rhonchi.  CARDIOVASCULAR:  S1, S2 is normal, regular, no murmurs appreciated, no  S3, S4, no bruits are heard.  ABDOMEN:  Soft, nontender, nondistended.  Bowel sounds are present.  No  mass or organomegaly is appreciated.  EXTREMITIES:  There is no obvious swelling on the right side, pulses are  present bilaterally.  There is tenderness over the right upper thigh  medially.  No erythema is noted, no warmth is present.  NEUROLOGICALLY:  Patient is alert, oriented x3, no focal neurological  deficits are present.   LAB DATA:  CBC is unremarkable, PT and INR unremarkable.  BMET is  unremarkable.  D-dimer elevated at 1.4.  She did have a Doppler of her  lower extremity which shows DVT  within the right common femoral vein and  throughout the right superficial femoral vein.   ASSESSMENT AND PLAN:  This is a 30 year old African-American female with  no real medical problems who underwent tumor resection from her urinary  bladder just about a week ago and now presents with right leg pain and  is found to have a deep vein thrombosis.  Her risk factor includes the  recent surgery.  I will not do any hypercoagulable work on this  individual at this time.   Plan:  1. Right DVT.  Will put her on Lovenox and Coumadin.  We will      stabilize her tonight, teach her how to administer the Lovenox and      hopefully she can be discharged tomorrow.  No hypercoagulable      workup needed at this time.  She will need anticoagulation for 3-6      months.  2. Regarding the urinary bladder mass, she needs to follow up with the      urologist and her gynecologist.  3. She will be provided education about the Coumadin as well.   Further management decision will be based on results of initial testing  and patient's response to treatment.      Osvaldo Shipper, MD  Electronically Signed     GK/MEDQ  D:  05/05/2007  T:  05/05/2007  Job:  [id number]   cc:   Corrie Mckusick, M.D.  Fax: [phone]

## 2011-02-24 NOTE — H&P (Signed)
NAME:  Sarah Cross, GODSIL   ACCOUNT NO.:  [id number]   MEDICAL RECORD NO.:  [id number]          PATIENT TYPE:  AMB   LOCATION:  DAY                           FACILITY:  [facility]   PHYSICIAN:  Tilda Burrow, M.D. DATE OF BIRTH:  [birth year]   DATE OF ADMISSION:  03/14/2008  DATE OF DISCHARGE:  LH                              HISTORY & PHYSICAL   ADMITTING DIAGNOSES:  1. Endometriosis with bladder involvement, chronic recurrent      hematurias when on anticoagulants.  2. History of deep vein thrombosis right thigh.  3. 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   HISTORY OF PRESENT ILLNESS:  This 30 year old female gravida 2, para 2  status post C-section x2, last menstrual period last week with extensive  medical history is admitted at this time for abdominal hysterectomy,  bilateral salpingo-oophorectomy for endometriosis with bladder  involvement.  Sarah Cross has been followed now for many years  including 2 pregnancies and cesarean section.  She had her tubes tied  with the last child.  She was found a couple of years ago to have had  some pressure symptoms in the suprapubic area and had ultrasound which  showed large tumor in the area of the bladder which on resection turned  out to be endometriosis felt to be originating from the area of the old  C-section scar.  This was removed by Dr. Jerre Simon.  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  The patient was evaluated and eventually  referred to Dr. Mariel Sleet who was familiar with her workup.  In summary,  the patient was felt to have had vena cava occlusion associated with  catheterizations when she was a neonate preemie delivered 28  years ago  at 26-[redacted] weeks gestation.  She had managed two pregnancies without  sequelae, has no swelling or pain or anything associated with this  occlusion and collateralization.  The 2008 thrombophlebitis was the  first episode of peripheral clotting associated with her medical  condition.  After the DVT which occurred during the postoperative time  after removal of the bladder tumor she was attempted to be placed on  anticoagulation but had recurrent heavy hematuria which prevented her  completion of the anticoagulation.  After decision was made to  discontinue the anticoagulation she has done fine.  No additional  clotting disorder identified and she has done well since that time.  The  case has been consulted with Dr. Mariel Sleet.  Due to the history of DVT  on the right side it was felt that she needs anticoagulation for 6 weeks  after surgery and after his recommendation anticoagulation with Lovenox  single daily dose of 1 mg/kg for 6 weeks postoperatively.  That will be  begun the day after surgery.  Perioperative prevention will involve use  of Flowtron leg compressive devices.   PAST MEDICAL HISTORY:  Benign.  PAST SURGICAL HISTORY:  Negative other than previously mentioned C-  section x2.   PHYSICAL EXAMINATION:  HEENT:  Pupils equal, round and reactive.  Extraocular movements intact.  CHEST:  Clear.  ABDOMEN:  Nontender. s/p pfannensteil incision  EXTERNAL GENITALIA:  Normal female, multiparous.  Uterus anterior upper  limits normal size, Anterverted.  Cul de sac thickened with suspected  endometriosis.  Adnexa without distinct masses   PLAN:  Hysterectomy with removal of tubes and ovaries with preoperative  ureteral catheter placement.  Surgery to be completed June 3.  Plan  hysterectomy and removal of tubes and ovaries.  On March 14, 2008, Dr.  Jerre Simon will be there for stent placement, as well as to assist with  resection of bladder attachments, as needed.      Tilda Burrow, M.D.  Electronically Signed     JVF/MEDQ  D:  03/14/2008  T:  03/14/2008  Job:  [id number]

## 2011-02-24 NOTE — Op Note (Signed)
NAME:  Sarah Cross, Sarah Cross   ACCOUNT NO.:  [id number]   MEDICAL RECORD NO.:  [id number]          PATIENT TYPE:  AMB   LOCATION:  DAY                           FACILITY:  [facility]   PHYSICIAN:  Tilda Burrow, M.D. DATE OF BIRTH:  [birth year]   DATE OF PROCEDURE:  DATE OF DISCHARGE:                               OPERATIVE REPORT   PREOPERATIVE DIAGNOSES:  1. Endometriosis with bladder involvement.  2. History of vena cava thrombosis with collateralization.  3. History of right side deep vein thrombosis.   POSTOPERATIVE DIAGNOSES:  1. Endometriosis with bladder involvement.  2. History of vena cava thrombosis with collateralization.  3. History of right side deep vein thrombosis.   PROCEDURE:  Exam under anesthesia, Tilda Burrow, MD.  Dr. Jerre Simon did  cystoscopy and retrograde pyelogram dictated elsewhere.   DETAILED INDICATIONS:  A 30 year old female with known endometriosis,  status post resection of some bladder involvement, was scheduled for  hysterectomy and bilateral salpingo-oophorectomy after replacement of  ureteral stents.  The endometriosis involves the bladder just behind the  trigone sufficiently to distort the ureter and prevent ureteral stent  placement.  Retrograde pyelogram on the left side could be performed and  did not show any hydronephrosis or hydroureter.   DETAILS OF PROCEDURE:  Essentially my part of the exam was limited by  the decisions made intraoperatively.  The cystoscopy by Dr. Jerre Simon  revealed an irregular distorted trigone and while the left ureteral  orifice and right ureteral orifice could be identified, both were  distorted.  The left ureteral orifice was located on the centimeters  from the area where the prior endometriosis nodule had been resected  laparoscopically.  The orifice could be cannulated sufficiently to  perform retrograde pyelogram, but despite multiple attempts of guide,  wires ovarian flexibilities, it was not possible to  cannulate the  patient's left ureter.  Similarly, the right side could be visualized,  was distorted sufficiently due to the medial thickening area, which  caused the trigone to be used and so the right ureteral orifice was not  cannulated either.  Photos were taken by Dr. Jerre Simon to document the  location of the ureteral orifices and the irregular surface to the  bladder just above the trigone.   Exam under anesthesia by me confirmed that the cervix was anterior with  the uterus anteflexed, within normal limits size with a firm area of  hard scar tissue on the anterior lower uterine segment, probably at 3-4  cm wide x 2 cm x 2 cm, representing the anterior endometriosis in the  area of the prior cesarean section scar.  Additionally, there was a  thick 4 cm wide x 3 cm x 3 cm area of firm nodularity in the cul-de-sac.  There was some mobility on rectal exam to suggest that there would  likely be a cleavage plane between the rectal mucosa and the cul-de-sac  endometrioma.  Adnexal exam had not identified any large masses in the  area of the ovaries.  The adnexa were actually quite mobile.   Plans will now be to refer the patient to [facility] for attempted  completion of hysterectomy.  The patient might be a candidate for  laparoscopic removal of tube and ovaries as a single as an initial  procedure and subsequent delayed interval hysterectomy after the  endometriosis decline down or other therapies as judged appropriate by  the physician, accepting or referral.      Tilda Burrow, M.D.  Electronically Signed     JVF/MEDQ  D:  03/14/2008  T:  03/15/2008  Job:  [id number]

## 2011-02-24 NOTE — Discharge Summary (Signed)
Sarah Cross, Sarah Cross NO.:  [id number]   MEDICAL RECORD NO.:  [id number]          PATIENT TYPE:  INP   LOCATION:  A309                          FACILITY:  [facility]   PHYSICIAN:  Osvaldo Shipper, MD     DATE OF BIRTH:  03-09-1981   DATE OF ADMISSION:  05/05/2007  DATE OF DISCHARGE:  07/25/2008LH                               DISCHARGE SUMMARY   PRIMARY MEDICAL DOCTOR:  Corrie Mckusick, M.D., [facility].   DISCHARGE DIAGNOSIS:  Deep venous thrombosis involving the right lower  extremity.   Please review H&P dictated yesterday for details regarding patient's  presenting illness.   BRIEF HOSPITAL COURSE:  Briefly, this is a 29 year old African American  female who works at a [employer] center who underwent a urological  procedure about a week ago and presented to her doctor's office  complaining of right leg pain.  A venous Doppler study showed DVT of the  right leg as described in the H&P.  Patient was started on Lovenox and  Coumadin yesterday.  Patient denied any chest pain or shortness of  breath.   This morning, patient is feeling quite well.  Denies any chest pain or  shortness of breath.  Her vital signs are stable.  She is a little bit  hypertensive but asymptomatic.  Saturations are good.  She feels like  she can go home.  Pain is very well controlled.  Examination is  completely unchanged from yesterday.   As I was reviewing the chart, I realized there has been a medication  error.  Unfortunately, patient's weight was put in as 135 kg instead of  135 pounds.  As a result, she was getting 135 mg of Lovenox twice a day.  I have explained to the patient about this error.  She verbalized  understanding.  I told her to watch for any bleeding or any such issues  for the next one or two days.  Her dose of Lovenox will be skipped  tonight and she will be asked to resume the Lovenox tomorrow morning.  She has been taught how to self-inject.   DISCHARGE  MEDICATIONS:  1. Lovenox 65 mg subcutaneously twice a day at 9 a.m. and 9 p.m. to be      started May 07, 2007, in the a.m.  2. Coumadin 5 mg at 6 p.m. daily.  3. She may taken over-the-counter Motrin or Tylenol to help with the      pain.   FOLLOW UP:  1. A PT/INR to be checked on Monday, May 09, 2007.  Dr. Phillips Odor to be      called for the results of the same to adjust the dose of her      Coumadin.  2. Appointment with Dr. Phillips Odor in one week's time to make sure      patient has been compliant with her medications.   DIET:  No restrictions.   PHYSICAL ACTIVITY:  She has been asked to increase activity slowly.  If  the patient has significant pain in her right leg, she may benefit from  compression stockings.  She has been given a note for work.  She has  been asked to go back to work May 13, 2007.   TOTAL TIME AT DISCHARGE:  35 minutes.      Osvaldo Shipper, MD  Electronically Signed     GK/MEDQ  D:  05/06/2007  T:  05/06/2007  Job:  [id number]   cc:   Corrie Mckusick, M.D.  Fax: [phone]   Ky Barban, M.D.  Fax: [phone]

## 2011-02-24 NOTE — Op Note (Signed)
NAMEMAGDELENE, Sarah Cross   ACCOUNT NO.:  [id number]   MEDICAL RECORD NO.:  [id number]          PATIENT TYPE:  OBV   LOCATION:  A318                          FACILITY:  [facility]   PHYSICIAN:  Ky Barban, M.D.DATE OF BIRTH:  1980-11-14   DATE OF PROCEDURE:  04/29/2007  DATE OF DISCHARGE:                               OPERATIVE REPORT   PREOPERATIVE DIAGNOSIS:  Bladder tumor.   POSTOPERATIVE DIAGNOSIS:  Bladder tumor.   PROCEDURE:  Transurethral resection of bladder tumor.   ANESTHESIA:  General.   PROCEDURE IN DETAIL:  Under general endotracheal anesthesia, she was  placed in lithotomy position, usual prep and drape.  A #28 Iglesias  resectoscope was introduced into the bladder.  There was a large tumor  located on the posterior bladder wall that was inspected, position  identified.  Resection of the tumor was started from the surface went  towards the base of the tumor and the tumor was rather flat covering a  large area.  The tumor was completely dissected.  The bleeders were  coagulated.  Chips were evacuated.  At the end, the resectoscope was  removed and a 22 Foley catheter left in for drainage.  The patient left  the operating room in satisfactory condition.      Ky Barban, M.D.  Electronically Signed     MIJ/MEDQ  D:  04/29/2007  T:  04/29/2007  Job:  [id number]

## 2011-02-24 NOTE — Op Note (Signed)
NAME:  Sarah Cross, Sarah Cross   ACCOUNT NO.:  [id number]   MEDICAL RECORD NO.:  [id number]          PATIENT TYPE:  AMB   LOCATION:  DAY                           FACILITY:  [facility]   PHYSICIAN:  Ky Barban, M.D.DATE OF BIRTH:  [birth year]   DATE OF PROCEDURE:  DATE OF DISCHARGE:                               OPERATIVE REPORT   PREOPERATIVE DIAGNOSES:  Endometriosis of the bladder.   POSTOPERATIVE DIAGNOSES:  Endometriosis of the bladder.   PROCEDURE:  Cystoscopy, left retrograde pyelogram, attempt to insert  bilateral ureteral catheter is failed.   ANESTHESIA:  General.   PROCEDURE:  The patient under general anesthesia in lithotomy position  and usual prep and drape, a #25 cystoscope was introduced into the  bladder.  The left hemitrigone area around the left ureteral orifice and  the left bladder wall was inflamed, irritated, edematous, and scarred;  that is the area probably she has endometriosis and the bladder is also  slightly distorted and is pushed to the left-side.  The whole trigone, I  can see is slightly pushed to the left side and I tried to pass the  guidewire first on the right and then on the left side.  I cannot get a  guidewire, but I was able to do a retrograde pyelogram on the left side.  I can see there is a King catheter at the ureterovesical junction.  The  ureter is not dilated.  I do not see there is any obstruction, but I  just cannot get any guidewire.  I tried with the ureteroscope also, but  I never entered into the ureters and I was simply at the level of the  ureteral orifice trying to guide the guidewire with the help of the  ureteroscope, I cannot get.  I tried a regular guidewire and also  Glidewire, I just cannot get any guidewire into the ureter; so at this  point, it was decided to cut the procedure and the patient has no  complication.  Bimanual pelvic exam is done.  Bladder appears to be  thick and slightly hard, that could be the  endometriosis causing that  feeling of the bladder.  The patient left the operating room in a  satisfactory condition.      Ky Barban, M.D.  Electronically Signed     MIJ/MEDQ  D:  03/14/2008  T:  03/15/2008  Job:  [id number]

## 2011-02-24 NOTE — Group Therapy Note (Signed)
Sarah Cross, Sarah Cross    ACCOUNT NO.:  [id number]   MEDICAL RECORD NO.:  [id number]          PATIENT TYPE:  INP   LOCATION:  A302                          FACILITY:  [facility]   PHYSICIAN:  Osvaldo Shipper, MD     DATE OF BIRTH:  22-Oct-1980   DATE OF PROCEDURE:  05/10/2007  DATE OF DISCHARGE:                                 PROGRESS NOTE   SUBJECTIVE:  Patient is feeling fine, no complaints are offered.  She  had a few questions about her Coumadin dosage which were answered to her  satisfaction.  Denied any more hematuria or any other bleeding from any  other site.   OBJECTIVE:  Her vital signs are all quite stable.  Examination  unremarkable.  No positive findings.  The right leg is nontender at this  time.   LABS:  Her INR is 1.3 today.   ASSESSMENT AND PLAN:  This is a 30 year old African-American female who  developed a deep vein thrombosis after undergoing a urological procedure  who had complication with hematuria requiring readmission into the  hospital.  She is currently on IV heparin and she was initiated on  Coumadin 2 days ago.  We are advocating that she continue to stay in the  hospital until her INR is therapeutic for 2 days because we want to be  sure she does not have recurrence of her bleeding.  I am hoping that her  INR would become therapeutic in 2 days' time, after which she will  probably need to stay one more day for overlap and then she can go home.  Her hematuria has resolved.  She was seen by Dr. Dennie Maizes who did  not feel that any further workup was warranted at this time.  It should  be noted that she had a urological procedure in the form of a bladder  tumor removal on the 18th of July.  The tumor was apparently  endometriosis and not malignancy.  She will need to follow up with Dr.  Jerre Simon as an outpatient.   Her PMD is Dr. Phillips Odor, who will monitor her PT, INRs after the patient  is discharged.      Osvaldo Shipper, MD  Electronically  Signed     GK/MEDQ  D:  05/10/2007  T:  05/10/2007  Job:  [id number]

## 2011-02-27 NOTE — Procedures (Signed)
[name], Sarah Cross   ACCOUNT NO.:  [id number]   MEDICAL RECORD NO.:  [id number]          PATIENT TYPE:  OUT   LOCATION:  RAD                           FACILITY:  [facility]   PHYSICIAN:  Vida Roller, M.D.   DATE OF BIRTH:  Apr 20, 1981   DATE OF PROCEDURE:  08/12/2004  DATE OF DISCHARGE:                                  ECHOCARDIOGRAM   PRIMARY CARE PHYSICIAN:  Dr. Franky Macho _____________.   TAPE NUMBER:  [id number].   TAPE COUNT:  0 through 430.   HISTORY:  A 29 year old female nine months pregnant with palpitations, no  previous echocardiogram's.   M-MODE TRACINGS:  The aorta is 23 mm.   Left atrium is 39 mm.   Septum is 10 mm.   Posterior wall is 10 mm.   Left ventricular diastolic dimension is 46 mm.   Left ventricular systolic dimension is 33 mm.   2-D AND DOPPLER IMAGING:  The left ventricle is normal size with normal  systolic function.  Estimated ejection fraction 55 to 60%.  There were no  wall motion abnormalities seen.   The right ventricle is top-normal in size, but has normal systolic function.  The right ventricular free wall is never well seen in any of these views.   Both atria appear to be normal size.   The aortic valve is trileaflet with tricommisure with no evidence of  stenosis or regurgitation.   The mitral valve does not have prolapse.  There is trivial insufficiency  seen.   The tricuspid valve has trace insufficiency.   Pulmonic valve appears to be small and delicate with no significant stenosis  or regurgitation.   The pericardial structures are without effusion.   The inferior vena cava was not well seen.   The ascending aorta was not well seen.     Trey Paula   JH/MEDQ  D:  08/12/2004  T:  08/12/2004  Job:  [id number]

## 2011-05-05 ENCOUNTER — Encounter: Payer: Self-pay | Admitting: *Deleted

## 2011-05-05 ENCOUNTER — Emergency Department (HOSPITAL_COMMUNITY)
Admission: EM | Admit: 2011-05-05 | Discharge: 2011-05-05 | Disposition: A | Discharge status: Home / Self Care | Payer: BC Managed Care – PPO | Attending: Emergency Medicine | Admitting: Emergency Medicine

## 2011-05-05 DIAGNOSIS — J069 Acute upper respiratory infection, unspecified: Secondary | ICD-10-CM | POA: Insufficient documentation

## 2011-05-05 DIAGNOSIS — J029 Acute pharyngitis, unspecified: Secondary | ICD-10-CM

## 2011-05-05 LAB — RAPID STREP SCREEN (MED CTR MEBANE ONLY): Streptococcus, Group A Screen (Direct): NEGATIVE

## 2011-05-05 MED ORDER — IBUPROFEN 800 MG PO TABS
800.0000 mg | ORAL_TABLET | Freq: Once | ORAL | Status: AC
  Administered 2011-05-05: 800 mg via ORAL
  Filled 2011-05-05: qty 1

## 2011-05-05 MED ORDER — IBUPROFEN 800 MG PO TABS: 800.0000 mg | ORAL_TABLET | Freq: Three times a day (TID) | ORAL | Status: AC

## 2011-05-05 NOTE — ED Notes (Signed)
Pt given gingerale, tolerating po fluids well

## 2011-05-05 NOTE — ED Notes (Signed)
Pt woke up this morning with a sore throat, headache, bilateral ear pain, and body aches.

## 2011-05-05 NOTE — ED Notes (Signed)
Tammy, PA in prior to RN, see PA assessment for further

## 2011-05-05 NOTE — ED Provider Notes (Signed)
History     Chief Complaint  Patient presents with  . Sore Throat  . Otalgia  . Headache  . Generalized Body Aches   Patient is a 30 y.o. female presenting with URI. The history is provided by the spouse.  URI The primary symptoms include fever, fatigue, headaches, ear pain, sore throat, cough and myalgias. Primary symptoms do not include swollen glands, wheezing, abdominal pain, nausea, vomiting, arthralgias or rash. The current episode started today. This is a new problem. The problem has not changed since onset. The fever began today. The maximum temperature recorded prior to her arrival was unknown.  The headache began today. The headache developed gradually. Headache is a new problem. The headache is not associated with photophobia, eye pain, decreased vision, stiff neck, neck stiffness, paresthesias, weakness or loss of balance.  The ear pain began today. Ear pain is a new problem. The ear pain has been unchanged since its onset. Both ears are affected. The pain is mild.    The sore throat began today. The sore throat has been unchanged since its onset. The sore throat is mild in intensity. The sore throat is not accompanied by trouble swallowing, drooling or stridor.  The cough began today. The cough is non-productive. The sputum is clear.  The myalgias are not associated with weakness.  Symptoms associated with the illness include chills and congestion. The illness is not associated with facial pain or rhinorrhea. The following treatments were addressed: Acetaminophen was not tried. NSAIDs were not tried.    History reviewed. No pertinent past medical history.  History reviewed. No pertinent past surgical history.  Family History  Problem Relation Age of Onset  . Diabetes Mother   . Hypertension Father   . Diabetes Other     History  Substance Use Topics  . Smoking status: Never Smoker   . Smokeless tobacco: Not on file  . Alcohol Use: No    OB History    Grav Para  Term Preterm Abortions TAB SAB Ect Mult Living                  Review of Systems  Constitutional: Positive for fever, chills and fatigue.  HENT: Positive for ear pain, congestion and sore throat. Negative for rhinorrhea, drooling, trouble swallowing, neck pain, neck stiffness and ear discharge.   Eyes: Negative for photophobia and pain.  Respiratory: Positive for cough. Negative for wheezing and stridor.   Cardiovascular: Negative.   Gastrointestinal: Negative for nausea, vomiting and abdominal pain.  Genitourinary: Negative for dysuria, flank pain and decreased urine volume.  Musculoskeletal: Positive for myalgias. Negative for arthralgias.  Skin: Negative.  Negative for rash.  Neurological: Positive for headaches. Negative for dizziness, weakness, light-headedness, numbness, paresthesias and loss of balance.    Physical Exam  BP 124/73  Pulse 70  Temp 99.4 F (37.4 C)  Resp 20  Ht 5\' 2"  (1.575 m)  Wt 153 lb (69.4 kg)  BMI 27.98 kg/m2  SpO2 100%  LMP 04/17/2011  Physical Exam  Nursing note and vitals reviewed. Constitutional: She is oriented to person, place, and time. She appears well-developed and well-nourished. No distress.  HENT:  Head: Normocephalic and atraumatic.  Right Ear: External ear normal.  Left Ear: External ear normal.  Eyes: EOM are normal. Pupils are equal, round, and reactive to light.  Neck: Normal range of motion. Neck supple. No thyromegaly present.  Cardiovascular: Normal rate, regular rhythm and normal heart sounds.   Pulmonary/Chest: Effort normal and breath  sounds normal.  Abdominal: Soft. There is no tenderness. There is no guarding.  Musculoskeletal: She exhibits no edema and no tenderness.  Lymphadenopathy:    She has no cervical adenopathy.  Neurological: She is alert and oriented to person, place, and time. No cranial nerve deficit. She exhibits normal muscle tone. Coordination normal.  Skin: Skin is warm and dry.  Psychiatric: She has a  normal mood and affect.    ED Course  Procedures  MDM    Patient has drank fluids.  Non-toxic appearing.  No focal neuro deficits, no meningeal signs.  Sx's likely related to URI      Shamell Suarez L. Cambrey Lupi, [place] 05/11/11 2310

## 2011-05-05 NOTE — ED Notes (Signed)
Pt given work note to go back to work on 05/07/2011

## 2011-05-10 NOTE — ED Provider Notes (Signed)
Medical screening examination/treatment/procedure(s) were performed by non-physician practitioner and as supervising physician I was immediately available for consultation/collaboration.   Nelia Shi, MD 05/10/11 1149

## 2011-05-13 NOTE — ED Provider Notes (Signed)
Medical screening examination/treatment/procedure(s) were performed by non-physician practitioner and as supervising physician I was immediately available for consultation/collaboration.   Valery Chance L Viviene Thurston, MD 05/13/11 0044 

## 2011-07-09 LAB — COMPREHENSIVE METABOLIC PANEL
AST: 21
Albumin: 4.2
Alkaline Phosphatase: 68
CO2: 26
Calcium: 9.6
Chloride: 106
Creatinine, Ser: 0.75
GFR calc non Af Amer: >60
Glucose, Bld: 77
Total Protein: 7.3

## 2011-07-09 LAB — CBC
Hemoglobin: 14.4
MCHC: 34.7
Platelets: 283
WBC: 7.9

## 2011-07-09 LAB — CROSSMATCH
ABO/RH(D): B POS
Antibody Screen: NEGATIVE

## 2011-07-09 LAB — URINALYSIS, ROUTINE W REFLEX MICROSCOPIC
Glucose, UA: NEGATIVE
Hgb urine dipstick: NEGATIVE
Specific Gravity, Urine: 1.02
Urobilinogen, UA: 0.2

## 2011-07-09 LAB — PROTIME-INR: INR: 1

## 2011-07-09 LAB — HCG, QUANTITATIVE, PREGNANCY: hCG, Beta Chain, Quant, S: <2

## 2011-07-09 LAB — APTT: aPTT: 26

## 2011-07-20 LAB — DIFFERENTIAL
Basophils Absolute: 0
Basophils Relative: 1
Lymphocytes Relative: 25
Monocytes Absolute: 0.4
Neutro Abs: 5.1
Neutrophils Relative %: 69

## 2011-07-20 LAB — CBC
Hemoglobin: 14.7
MCHC: 33.4
Platelets: 342
RDW: 14.2

## 2011-07-20 LAB — IRON AND TIBC
Iron: 94
Saturation Ratios: 23
TIBC: 411
UIBC: 317

## 2011-07-23 LAB — DIFFERENTIAL
Basophils Absolute: 0
Basophils Relative: 0
Eosinophils Absolute: 0.1
Monocytes Relative: 3
Neutro Abs: 4.1
Neutrophils Relative %: 68

## 2011-07-23 LAB — CBC
HCT: 42.8
MCHC: 33.2
MCV: 84.8
RDW: 14.1 — ABNORMAL HIGH

## 2011-07-23 LAB — PROTEIN S, TOTAL: Protein S Ag, Total: 93 % (ref 70–140)

## 2011-07-23 LAB — PROTEIN C, TOTAL: Protein C, Total: 98 % (ref 70–140)

## 2011-07-27 LAB — CBC
HCT: 36
HCT: 40.1
HCT: 39.1
HCT: 45.9
Hemoglobin: 12.6
Hemoglobin: 13.4
Hemoglobin: 13.1
Hemoglobin: 13.1
Hemoglobin: 13.3
Hemoglobin: 13.4
Hemoglobin: 13.4
Hemoglobin: 13.7
Hemoglobin: 13.8
Hemoglobin: 15.5 — ABNORMAL HIGH
MCHC: 34.3
MCHC: 34.5
MCHC: 33.5
MCHC: 33.7
MCHC: 33.8
MCHC: 34
MCHC: 34.1
MCHC: 34.4
MCV: 84.4
MCV: 84.4
MCV: 86
MCV: 84.7
MCV: 84.9
Platelets: 292
Platelets: 242
Platelets: 244
Platelets: 292
RBC: 4.32
RBC: 4.52
RBC: 4.57
RBC: 4.61
RBC: 4.61
RBC: 4.66
RBC: 4.73
RBC: 5.2 — ABNORMAL HIGH
RDW: 13.9
RDW: 13.6
RDW: 13.8
RDW: 13.9
RDW: 13.9
RDW: 14.1 — ABNORMAL HIGH
RDW: 14.2 — ABNORMAL HIGH
WBC: 8.1
WBC: 7.9
WBC: 7.9
WBC: 8

## 2011-07-27 LAB — PROTIME-INR
INR: 1
INR: 1
INR: 1.1
INR: 1.2
INR: 1.3
INR: 1.4
INR: 1.5
Prothrombin Time: 12.9
Prothrombin Time: 14.2
Prothrombin Time: 15.7 — ABNORMAL HIGH
Prothrombin Time: 16.2 — ABNORMAL HIGH
Prothrombin Time: 17.9 — ABNORMAL HIGH
Prothrombin Time: 18.9 — ABNORMAL HIGH

## 2011-07-27 LAB — URINALYSIS, ROUTINE W REFLEX MICROSCOPIC
Glucose, UA: NEGATIVE
Glucose, UA: NEGATIVE
Ketones, ur: NEGATIVE
Leukocytes, UA: NEGATIVE
Leukocytes, UA: NEGATIVE
Nitrite: NEGATIVE
Nitrite: NEGATIVE
Protein, ur: 30 — AB
Protein, ur: >300 — AB
Protein, ur: NEGATIVE
Specific Gravity, Urine: 1.02
Urobilinogen, UA: 0.2
Urobilinogen, UA: 0.2

## 2011-07-27 LAB — BASIC METABOLIC PANEL
BUN: 4 — ABNORMAL LOW
BUN: 7
CO2: 23
Calcium: 8.8
Calcium: 9.5
Calcium: 9.6
Chloride: 109
Creatinine, Ser: 0.61
Creatinine, Ser: 0.65
GFR calc Af Amer: >60
GFR calc Af Amer: >60
GFR calc Af Amer: >60
GFR calc non Af Amer: >60
GFR calc non Af Amer: >60
Glucose, Bld: 100 — ABNORMAL HIGH
Glucose, Bld: 81
Potassium: 3.8
Sodium: 136
Sodium: 136
Sodium: 136

## 2011-07-27 LAB — DIFFERENTIAL
Basophils Absolute: 0
Basophils Absolute: 0
Basophils Absolute: 0
Basophils Absolute: 0.1
Basophils Absolute: 0.1
Basophils Relative: 0
Basophils Relative: 1
Basophils Relative: 0
Basophils Relative: 0
Basophils Relative: 0
Basophils Relative: 0
Basophils Relative: 1
Basophils Relative: 1
Basophils Relative: 1
Eosinophils Absolute: 0.1
Eosinophils Absolute: 0.1
Eosinophils Absolute: 0.1
Eosinophils Absolute: 0.1
Eosinophils Relative: 1
Eosinophils Relative: 2
Lymphocytes Relative: 23
Lymphocytes Relative: 29
Lymphocytes Relative: 30
Lymphocytes Relative: 33
Lymphocytes Relative: 33
Lymphs Abs: 2.5
Lymphs Abs: 2
Lymphs Abs: 2.3
Lymphs Abs: 2.5
Lymphs Abs: 2.7
Monocytes Absolute: 0.5
Monocytes Absolute: 0.4
Monocytes Absolute: 0.5
Monocytes Absolute: 0.5
Monocytes Absolute: 0.5
Monocytes Absolute: 0.7
Monocytes Absolute: 0.7
Monocytes Relative: 6
Monocytes Relative: 6
Monocytes Relative: 2 — ABNORMAL LOW
Monocytes Relative: 5
Monocytes Relative: 6
Monocytes Relative: 7
Monocytes Relative: 7
Monocytes Relative: 7
Neutro Abs: 4.3
Neutro Abs: 4.8
Neutro Abs: 5
Neutro Abs: 5
Neutro Abs: 5.4
Neutro Abs: 5.6
Neutro Abs: 6.4
Neutrophils Relative %: 61
Neutrophils Relative %: 59
Neutrophils Relative %: 60
Neutrophils Relative %: 62
Neutrophils Relative %: 62
Neutrophils Relative %: 70
Neutrophils Relative %: 72

## 2011-07-27 LAB — COMPREHENSIVE METABOLIC PANEL
Alkaline Phosphatase: 66
BUN: 7
Calcium: 9.9
Creatinine, Ser: 0.79
Glucose, Bld: 93
Potassium: 3.6
Total Protein: 8.2

## 2011-07-27 LAB — HOMOCYSTEINE: Homocysteine: 7

## 2011-07-27 LAB — PROTEIN S, TOTAL: Protein S Ag, Total: 87 % (ref 70–140)

## 2011-07-27 LAB — HEPARIN LEVEL (UNFRACTIONATED)
Heparin Unfractionated: 0.29 — ABNORMAL LOW
Heparin Unfractionated: 0.45
Heparin Unfractionated: 0.77 — ABNORMAL HIGH

## 2011-07-27 LAB — URINE MICROSCOPIC-ADD ON

## 2011-07-27 LAB — URINE CULTURE

## 2011-07-27 LAB — APTT
aPTT: 28
aPTT: 26

## 2011-07-27 LAB — D-DIMER, QUANTITATIVE: D-Dimer, Quant: 1.41 — ABNORMAL HIGH

## 2011-07-27 LAB — HCG, QUANTITATIVE, PREGNANCY: hCG, Beta Chain, Quant, S: <2

## 2011-07-30 LAB — PREGNANCY, URINE: Preg Test, Ur: NEGATIVE

## 2011-07-30 LAB — URINALYSIS, ROUTINE W REFLEX MICROSCOPIC
Nitrite: NEGATIVE
Protein, ur: NEGATIVE
Specific Gravity, Urine: 1.025
Urobilinogen, UA: 0.2

## 2011-07-30 LAB — CBC
Platelets: 306
RDW: 13.8
WBC: 8.4

## 2011-07-30 LAB — WET PREP, GENITAL: Trich, Wet Prep: NONE SEEN

## 2011-07-30 LAB — BASIC METABOLIC PANEL
BUN: 8
Calcium: 9.8
GFR calc non Af Amer: >60
Glucose, Bld: 115 — ABNORMAL HIGH
Sodium: 139

## 2011-07-30 LAB — DIFFERENTIAL
Basophils Absolute: 0.1
Lymphocytes Relative: 31
Neutro Abs: 5.1

## 2011-07-30 LAB — URINE MICROSCOPIC-ADD ON

## 2011-07-30 LAB — GC/CHLAMYDIA PROBE AMP, GENITAL
Chlamydia, DNA Probe: NEGATIVE
GC Probe Amp, Genital: NEGATIVE

## 2011-09-20 ENCOUNTER — Emergency Department (HOSPITAL_COMMUNITY)
Admission: EM | Admit: 2011-09-20 | Discharge: 2011-09-20 | Disposition: A | Discharge status: Home / Self Care | Payer: BC Managed Care – PPO | Attending: Emergency Medicine | Admitting: Emergency Medicine

## 2011-09-20 ENCOUNTER — Encounter (HOSPITAL_COMMUNITY): Payer: Self-pay

## 2011-09-20 DIAGNOSIS — J111 Influenza due to unidentified influenza virus with other respiratory manifestations: Secondary | ICD-10-CM | POA: Insufficient documentation

## 2011-09-20 LAB — RAPID STREP SCREEN (MED CTR MEBANE ONLY): Streptococcus, Group A Screen (Direct): NEGATIVE

## 2011-09-20 MED ORDER — IBUPROFEN 800 MG PO TABS
800.0000 mg | ORAL_TABLET | Freq: Once | ORAL | Status: AC
  Administered 2011-09-20: 800 mg via ORAL
  Filled 2011-09-20: qty 1

## 2011-09-20 MED ORDER — OSELTAMIVIR PHOSPHATE 75 MG PO CAPS: 75.0000 mg | ORAL_CAPSULE | Freq: Two times a day (BID) | ORAL | Status: AC

## 2011-09-20 NOTE — ED Notes (Signed)
Pt presents with generalized body aches, fever, chills, cough, back pain, runny nose, sore throat, and bilateral ear pain since Thursday. Pt states cough is dry. Pt states she has been taking Ibuprofen (LD @ 0600). NAD at this time.

## 2011-09-21 NOTE — ED Provider Notes (Signed)
History     CSN: [id number] Arrival date & time: 09/20/2011  1:17 PM   First MD Initiated Contact with Patient 09/20/11 1323      Chief Complaint  Patient presents with  . Generalized Body Aches  . Otalgia  . Fever  . Cough  . Nasal Congestion  . Sore Throat  . Back Pain  . Chills    (Consider location/radiation/quality/duration/timing/severity/associated sxs/prior treatment) HPI Comments: Patient works at a [employer].  Patient is a 30 y.o. female presenting with flu symptoms. The history is provided by the patient.  Influenza This is a new problem. Episode onset: 2 days ago. The problem occurs constantly. The problem has been unchanged. Associated symptoms include chills, coughing, fatigue, a fever, myalgias, a sore throat and swollen glands. Pertinent negatives include no abdominal pain, arthralgias, chest pain, congestion, headaches, joint swelling, nausea, neck pain, numbness, rash, vomiting or weakness. Associated symptoms comments: Reports bilateral ear pain,  Worse when she swallows.. The symptoms are aggravated by nothing. She has tried NSAIDs for the symptoms. The treatment provided no relief.    History reviewed. No pertinent past medical history.  Past Surgical History  Procedure Date  . Bladder tumor excision     Family History  Problem Relation Age of Onset  . Diabetes Mother   . Hypertension Father   . Diabetes Other     History  Substance Use Topics  . Smoking status: Never Smoker   . Smokeless tobacco: Not on file  . Alcohol Use: No    OB History    Grav Para Term Preterm Abortions TAB SAB Ect Mult Living                  Review of Systems  Constitutional: Positive for fever, chills and fatigue.  HENT: Positive for sore throat. Negative for congestion and neck pain.   Eyes: Negative.   Respiratory: Positive for cough. Negative for chest tightness, shortness of breath and wheezing.   Cardiovascular: Negative for chest pain.    Gastrointestinal: Negative for nausea, vomiting and abdominal pain.  Genitourinary: Negative.   Musculoskeletal: Positive for myalgias and back pain. Negative for joint swelling and arthralgias.  Skin: Negative.  Negative for rash and wound.  Neurological: Negative for dizziness, weakness, light-headedness, numbness and headaches.  Hematological: Negative.   Psychiatric/Behavioral: Negative.     Allergies  Latex  Home Medications   Current Outpatient Rx  Name Route Sig Dispense Refill  . IBUPROFEN 200 MG PO TABS Oral Take 400 mg by mouth every 6 (six) hours as needed. Pain     . OSELTAMIVIR PHOSPHATE 75 MG PO CAPS Oral Take 1 capsule (75 mg total) by mouth 2 (two) times daily. 10 capsule 0    BP 124/71  Pulse 86  Temp(Src) 99.4 F (37.4 C) (Oral)  Resp 18  Ht 5\' 2"  (1.575 m)  Wt 157 lb (71.215 kg)  BMI 28.72 kg/m2  SpO2 100%  LMP 08/29/2011  Physical Exam  Nursing note and vitals reviewed. Constitutional: She is oriented to person, place, and time. She appears well-developed and well-nourished.       Appears fatigued  HENT:  Head: Normocephalic and atraumatic.  Right Ear: External ear normal.  Left Ear: External ear normal.  Mouth/Throat: Uvula is midline and mucous membranes are normal. Posterior oropharyngeal erythema present. No oropharyngeal exudate or posterior oropharyngeal edema.  Eyes: Conjunctivae are normal.  Neck: Normal range of motion.  Cardiovascular: Normal rate, regular rhythm, normal heart sounds and  intact distal pulses.   Pulmonary/Chest: Effort normal and breath sounds normal. No respiratory distress. She has no wheezes. She has no rales.  Abdominal: Soft. Bowel sounds are normal. There is no tenderness.  Musculoskeletal: Normal range of motion. She exhibits no tenderness.  Lymphadenopathy:       Head (right side): Submandibular adenopathy present.       Head (left side): Submandibular adenopathy present.  Neurological: She is alert and oriented  to person, place, and time.  Skin: Skin is warm and dry.  Psychiatric: She has a normal mood and affect.    ED Course  Procedures (including critical care time)  Results for orders placed during the hospital encounter of 09/20/11  RAPID STREP SCREEN      Component Value Range   Streptococcus, Group A Screen (Direct) NEGATIVE  NEGATIVE    No results found.     1. Influenza       MDM  Patients labs and/or radiological studies were reviewed during the medical decision making and disposition process.  Pt with classic influenza sx,  Stable,  Presents 2 days into sx,  Will cover with tamiflu.  Rest,  Fluids,  Tylenol/motrin for fever and myalgia         Candis Musa, PA 09/21/11 1507

## 2011-09-21 NOTE — ED Provider Notes (Signed)
Medical screening examination/treatment/procedure(s) were performed by non-physician practitioner and as supervising physician I was immediately available for consultation/collaboration.   Shelda Jakes, MD 09/21/11 [phone]

## 2012-02-21 ENCOUNTER — Encounter (HOSPITAL_COMMUNITY): Payer: Self-pay | Admitting: *Deleted

## 2012-02-21 DIAGNOSIS — R52 Pain, unspecified: Secondary | ICD-10-CM | POA: Insufficient documentation

## 2012-02-21 DIAGNOSIS — R070 Pain in throat: Secondary | ICD-10-CM | POA: Insufficient documentation

## 2012-02-21 DIAGNOSIS — R599 Enlarged lymph nodes, unspecified: Secondary | ICD-10-CM | POA: Insufficient documentation

## 2012-02-21 DIAGNOSIS — R51 Headache: Secondary | ICD-10-CM | POA: Insufficient documentation

## 2012-02-21 NOTE — ED Notes (Signed)
Pt reports generalized body aches starting this am

## 2012-02-22 ENCOUNTER — Emergency Department (HOSPITAL_COMMUNITY)
Admission: EM | Admit: 2012-02-22 | Discharge: 2012-02-22 | Disposition: A | Discharge status: Home / Self Care | Payer: BC Managed Care – PPO | Attending: Emergency Medicine | Admitting: Emergency Medicine

## 2012-02-22 DIAGNOSIS — B349 Viral infection, unspecified: Secondary | ICD-10-CM

## 2012-02-22 LAB — RAPID STREP SCREEN (MED CTR MEBANE ONLY): Streptococcus, Group A Screen (Direct): NEGATIVE

## 2012-02-22 MED ORDER — IBUPROFEN 800 MG PO TABS: 800.0000 mg | ORAL_TABLET | Freq: Three times a day (TID) | ORAL | Status: AC

## 2012-02-22 MED ORDER — IBUPROFEN 800 MG PO TABS
800.0000 mg | ORAL_TABLET | Freq: Once | ORAL | Status: AC
  Administered 2012-02-22: 800 mg via ORAL
  Filled 2012-02-22: qty 1

## 2012-02-22 NOTE — ED Provider Notes (Signed)
History     CSN: [id number]  Arrival date & time 02/21/12  2244   First MD Initiated Contact with Patient 02/22/12 0138      Chief Complaint  Patient presents with  . Generalized Body Aches    (Consider location/radiation/quality/duration/timing/severity/associated sxs/prior treatment) HPI Comments: Patient presents with one day of bodyaches, subjective fevers, chills, sore throat. She denies any cough, chest pain, shortness of breath. She denies any sick contacts. She's had no abdominal pain, vomiting or diarrhea.  She denies any nasal discharge or sinus congestion. She is eating well and urinating well.  The history is provided by the patient.    History reviewed. No pertinent past medical history.  Past Surgical History  Procedure Date  . Bladder tumor excision     Family History  Problem Relation Age of Onset  . Diabetes Mother   . Hypertension Father   . Diabetes Other     History  Substance Use Topics  . Smoking status: Never Smoker   . Smokeless tobacco: Not on file  . Alcohol Use: No    OB History    Grav Para Term Preterm Abortions TAB SAB Ect Mult Living                  Review of Systems  Constitutional: Positive for activity change, appetite change and fatigue. Negative for fever.  HENT: Positive for sore throat. Negative for congestion, rhinorrhea, neck pain and neck stiffness.   Respiratory: Negative for cough, chest tightness and shortness of breath.   Cardiovascular: Negative for chest pain.  Gastrointestinal: Negative for nausea, vomiting and abdominal pain.  Genitourinary: Negative for dysuria, vaginal bleeding and vaginal discharge.  Musculoskeletal: Positive for myalgias and arthralgias. Negative for back pain.    Allergies  Latex  Home Medications   Current Outpatient Rx  Name Route Sig Dispense Refill  . IBUPROFEN 200 MG PO TABS Oral Take 400 mg by mouth every 6 (six) hours as needed. Pain       BP 130/72  Pulse 81  Temp(Src)  98.8 F (37.1 C) (Oral)  Resp 18  Ht 5\' 2"  (1.575 m)  Wt 157 lb (71.215 kg)  BMI 28.72 kg/m2  SpO2 99%  LMP 02/05/2012  Physical Exam  Constitutional: She is oriented to person, place, and time. She appears well-developed and well-nourished. No distress.  HENT:  Head: Normocephalic and atraumatic.  Right Ear: External ear normal.  Left Ear: External ear normal.  Mouth/Throat: No oropharyngeal exudate.       Posterior pharynx erythema  Eyes: Conjunctivae are normal. Pupils are equal, round, and reactive to light.  Neck: Normal range of motion. Neck supple.       No meningismus  Cardiovascular: Normal rate, regular rhythm and normal heart sounds.   No murmur heard. Pulmonary/Chest: Effort normal and breath sounds normal. No respiratory distress.  Abdominal: Soft. There is no tenderness. There is no rebound and no guarding.  Musculoskeletal: Normal range of motion. She exhibits no edema and no tenderness.  Lymphadenopathy:    She has cervical adenopathy.  Neurological: She is alert and oriented to person, place, and time. No cranial nerve deficit.  Skin: Skin is warm.    ED Course  Procedures (including critical care time)   Labs Reviewed  RAPID STREP SCREEN   No results found.   No diagnosis found.    MDM  Body aches, headache, sore throat. Nontoxic appearing, no neuro deficits, no meningismus lungs clear bilaterally  Rapid strep negative.  Nontoxic appearance.  Supportive care for viral syndrome.      Glynn Octave, MD 02/22/12 [phone]

## 2012-02-22 NOTE — Discharge Instructions (Signed)
Viral Syndrome You or your child has Viral Syndrome. It is the most common infection causing "colds" and infections in the nose, throat, sinuses, and breathing tubes. Sometimes the infection causes nausea, vomiting, or diarrhea. The germ that causes the infection is a virus. No antibiotic or other medicine will kill it. There are medicines that you can take to make you or your child more comfortable.  HOME CARE INSTRUCTIONS   Rest in bed until you start to feel better.   If you have diarrhea or vomiting, eat small amounts of crackers and toast. Soup is helpful.   Do not give aspirin or medicine that contains aspirin to children.   Only take over-the-counter or prescription medicines for pain, discomfort, or fever as directed by your caregiver.  SEEK IMMEDIATE MEDICAL CARE IF:   You or your child has not improved within one week.   You or your child has pain that is not at least partially relieved by over-the-counter medicine.   Thick, colored mucus or blood is coughed up.   Discharge from the nose becomes thick yellow or green.   Diarrhea or vomiting gets worse.   There is any major change in your or your child's condition.   You or your child develops a skin rash, stiff neck, severe headache, or are unable to hold down food or fluid.   You or your child has an oral temperature above 102 F (38.9 C), not controlled by medicine.   Your baby is older than 3 months with a rectal temperature of 102 F (38.9 C) or higher.   Your baby is 3 months old or younger with a rectal temperature of 100.4 F (38 C) or higher.  Document Released: 09/13/2006 Document Revised: 09/17/2011 Document Reviewed: 09/14/2007 Exit[employer] Patient Information 2012 [employer]. 

## 2013-02-13 ENCOUNTER — Encounter: Payer: Self-pay | Admitting: Adult Health

## 2013-02-13 ENCOUNTER — Other Ambulatory Visit (HOSPITAL_COMMUNITY)
Admission: RE | Admit: 2013-02-13 | Discharge: 2013-02-13 | Disposition: A | Discharge status: Home / Self Care | Payer: Managed Care, Other (non HMO) | Source: Ambulatory Visit | Attending: Adult Health | Admitting: Adult Health

## 2013-02-13 ENCOUNTER — Ambulatory Visit (INDEPENDENT_AMBULATORY_CARE_PROVIDER_SITE_OTHER): Payer: Managed Care, Other (non HMO) | Admitting: Adult Health

## 2013-02-13 VITALS — BP 110/70 | HR 70 | Ht 61.0 in | Wt 160.0 lb

## 2013-02-13 DIAGNOSIS — N80129 Deep endometriosis of ovary, unspecified ovary: Secondary | ICD-10-CM | POA: Insufficient documentation

## 2013-02-13 DIAGNOSIS — Z01419 Encounter for gynecological examination (general) (routine) without abnormal findings: Secondary | ICD-10-CM

## 2013-02-13 DIAGNOSIS — N809 Endometriosis, unspecified: Secondary | ICD-10-CM | POA: Insufficient documentation

## 2013-02-13 DIAGNOSIS — Z1151 Encounter for screening for human papillomavirus (HPV): Secondary | ICD-10-CM | POA: Insufficient documentation

## 2013-02-13 DIAGNOSIS — R87810 Cervical high risk human papillomavirus (HPV) DNA test positive: Secondary | ICD-10-CM | POA: Insufficient documentation

## 2013-02-13 NOTE — Patient Instructions (Addendum)
Discussed timing of intercourse  Take folic acid per day Sign up for my chart Physical in 1 year or before if pregnancy achieved

## 2013-02-13 NOTE — Progress Notes (Signed)
Patient ID: Sarah Cross, female   DOB: 05/31/1981, 32 y.o.   MRN: [id number] History of Present Illness: Sarah Cross is a 32 year old black female, divorced, in for pap and physical.  Current Medications, Allergies, Past Medical History, Past Surgical History, Family History and Social History were reviewed in [facility] electronic medical record.    Review of Systems: Patient denies any headaches, blurred vision, shortness of breath, chest pain, abdominal pain, problems with bowel movements, urination, or intercourse. Has occasional pain with menses, no joint pain or swelling, no mood changes or depression. She has a steady partner and they would like a child. We discussed timing of intercourse and the need to take folic acid. Her son is 8 now.   Physical Exam:Blood pressure 110/70, pulse 70, height 5\' 1"  (1.549 m), weight 160 lb (72.576 kg), last menstrual period 01/30/2013. General:  Well developed, well nourished, no acute distress Skin:  Warm and dry Neck:  Midline trachea, normal thyroid Lungs; Clear to auscultation bilaterally Breast:  No dominant palpable mass, retraction, or nipple discharge Cardiovascular: Regular rate and rhythm Abdomen:  Soft, non tender, no hepatosplenomegaly Pelvic:  External genitalia is normal in appearance.  The vagina is normal in appearance with brownish discharge, no odor. The cervix is nulliparous. Pap performed with HPV. Uterus is felt to be normal size, shape, and contour.  No adnexal masses or tenderness noted. Extremities:  No swelling or varicosities noted, has scar right inner thigh. Psych:  No mood changes, alert and cooperative  Impression: Yearly exam History of endometriosis Desire for pregnancy  Plan: Physical in 1 year  Take 800 mcg of folic acid Time intercourse

## 2013-02-21 ENCOUNTER — Telehealth: Payer: Self-pay | Admitting: Adult Health

## 2013-02-21 NOTE — Telephone Encounter (Signed)
Left message to call me back.

## 2013-03-02 ENCOUNTER — Encounter: Payer: Self-pay | Admitting: Adult Health

## 2013-03-02 ENCOUNTER — Ambulatory Visit (INDEPENDENT_AMBULATORY_CARE_PROVIDER_SITE_OTHER): Payer: Managed Care, Other (non HMO) | Admitting: Adult Health

## 2013-03-02 VITALS — BP 120/80 | Ht 62.0 in | Wt 159.0 lb

## 2013-03-02 DIAGNOSIS — B977 Papillomavirus as the cause of diseases classified elsewhere: Secondary | ICD-10-CM

## 2013-03-02 DIAGNOSIS — IMO0002 Reserved for concepts with insufficient information to code with codable children: Secondary | ICD-10-CM

## 2013-03-02 DIAGNOSIS — R6889 Other general symptoms and signs: Secondary | ICD-10-CM

## 2013-03-02 HISTORY — DX: Reserved for concepts with insufficient information to code with codable children: IMO0002

## 2013-03-02 NOTE — Patient Instructions (Addendum)
Did hpv typing for #16 will talk in about a week with results

## 2013-03-02 NOTE — Progress Notes (Signed)
Subjective:     Patient ID: Sarah Cross, female   DOB: Sep 08, 1981, 32 y.o.   MRN: [id number]  HPI Clay is back because her pap was + for HPV and she desires typing now after we discussed the HPV virus.  Review of Systems No complaints Reviewed past medical,surgical, social and family history. Reviewed medications and allergies.     Objective:   Physical Exam Blood pressure 120/80, height 5\' 2"  (1.575 m), weight 159 lb (72.122 kg), last menstrual period 02/19/2013. Skin warm and dry.Pelvic: external genitalia is normal in appearance, vagina: has good color, moisture and rugaer,cervix:smooth and bulbous, uterus: normal size, shape and contour, non tender, no masses felt, adnexa: no masses or tenderness noted. Used One swab with MDL to type for #16    Assessment:      +HPV on pap, in for typing    Plan:      Send one swab for HPV typing #16 Will follow up by phone Gave her a handout on abnormal paps and HPV by [facility] to read

## 2013-03-15 ENCOUNTER — Telehealth: Payer: Self-pay | Admitting: Adult Health

## 2013-03-15 NOTE — Telephone Encounter (Signed)
Called [facility] with results of HPV typing, it was # 66, discussed we could wait and repeat pap in 6-12 months or do colpo now to check the cervix and she desires the colpo, will schedule appt with Dr. Despina Hidden

## 2013-03-21 ENCOUNTER — Emergency Department (HOSPITAL_COMMUNITY): Payer: Managed Care, Other (non HMO)

## 2013-03-21 ENCOUNTER — Encounter (HOSPITAL_COMMUNITY): Payer: Self-pay | Admitting: *Deleted

## 2013-03-21 ENCOUNTER — Emergency Department (HOSPITAL_COMMUNITY)
Admission: EM | Admit: 2013-03-21 | Discharge: 2013-03-21 | Disposition: A | Discharge status: Home / Self Care | Payer: Managed Care, Other (non HMO) | Attending: Emergency Medicine | Admitting: Emergency Medicine

## 2013-03-21 DIAGNOSIS — Z87798 Personal history of other (corrected) congenital malformations: Secondary | ICD-10-CM | POA: Insufficient documentation

## 2013-03-21 DIAGNOSIS — Z79899 Other long term (current) drug therapy: Secondary | ICD-10-CM | POA: Insufficient documentation

## 2013-03-21 DIAGNOSIS — N760 Acute vaginitis: Secondary | ICD-10-CM

## 2013-03-21 DIAGNOSIS — R102 Pelvic and perineal pain: Secondary | ICD-10-CM

## 2013-03-21 DIAGNOSIS — M549 Dorsalgia, unspecified: Secondary | ICD-10-CM | POA: Insufficient documentation

## 2013-03-21 DIAGNOSIS — B9689 Other specified bacterial agents as the cause of diseases classified elsewhere: Secondary | ICD-10-CM

## 2013-03-21 DIAGNOSIS — Z91040 Latex allergy status: Secondary | ICD-10-CM | POA: Insufficient documentation

## 2013-03-21 DIAGNOSIS — N949 Unspecified condition associated with female genital organs and menstrual cycle: Secondary | ICD-10-CM | POA: Insufficient documentation

## 2013-03-21 DIAGNOSIS — Z3202 Encounter for pregnancy test, result negative: Secondary | ICD-10-CM | POA: Insufficient documentation

## 2013-03-21 DIAGNOSIS — Z862 Personal history of diseases of the blood and blood-forming organs and certain disorders involving the immune mechanism: Secondary | ICD-10-CM | POA: Insufficient documentation

## 2013-03-21 DIAGNOSIS — Z8742 Personal history of other diseases of the female genital tract: Secondary | ICD-10-CM | POA: Insufficient documentation

## 2013-03-21 LAB — CBC WITH DIFFERENTIAL/PLATELET
Basophils Absolute: 0 10*3/uL (ref 0.0–0.1)
Basophils Relative: 0 % (ref 0–1)
Eosinophils Absolute: 0.1 10*3/uL (ref 0.0–0.7)
Eosinophils Relative: 1 % (ref 0–5)
HCT: 40.3 % (ref 36.0–46.0)
Hemoglobin: 13.9 g/dL (ref 12.0–15.0)
Lymphocytes Relative: 29 % (ref 12–46)
Lymphs Abs: 2.6 10*3/uL (ref 0.7–4.0)
MCH: 28.7 pg (ref 26.0–34.0)
MCHC: 34.5 g/dL (ref 30.0–36.0)
MCV: 83.1 fL (ref 78.0–100.0)
Monocytes Absolute: 0.5 10*3/uL (ref 0.1–1.0)
Monocytes Relative: 6 % (ref 3–12)
Neutro Abs: 5.9 10*3/uL (ref 1.7–7.7)
Neutrophils Relative %: 65 % (ref 43–77)
Platelets: 285 10*3/uL (ref 150–400)
RBC: 4.85 MIL/uL (ref 3.87–5.11)
RDW: 14.4 % (ref 11.5–15.5)
WBC: 9.2 10*3/uL (ref 4.0–10.5)

## 2013-03-21 LAB — BASIC METABOLIC PANEL
BUN: 9 mg/dL (ref 6–23)
CO2: 28 mEq/L (ref 19–32)
Calcium: 9.7 mg/dL (ref 8.4–10.5)
Chloride: 99 mEq/L (ref 96–112)
Creatinine, Ser: 0.75 mg/dL (ref 0.50–1.10)
GFR calc Af Amer: >90 mL/min (ref >90)
GFR calc non Af Amer: >90 mL/min (ref >90)
Glucose, Bld: 75 mg/dL (ref 70–99)
Potassium: 3.5 mEq/L (ref 3.5–5.1)
Sodium: 138 mEq/L (ref 135–145)

## 2013-03-21 LAB — URINALYSIS, ROUTINE W REFLEX MICROSCOPIC
Bilirubin Urine: NEGATIVE
Glucose, UA: NEGATIVE mg/dL
Hgb urine dipstick: NEGATIVE
Ketones, ur: NEGATIVE mg/dL
Leukocytes, UA: NEGATIVE
Nitrite: NEGATIVE
Protein, ur: NEGATIVE mg/dL
Specific Gravity, Urine: 1.025 (ref 1.005–1.030)
Urobilinogen, UA: 0.2 mg/dL (ref 0.0–1.0)
pH: 6 (ref 5.0–8.0)

## 2013-03-21 LAB — WET PREP, GENITAL
Trich, Wet Prep: NONE SEEN
Yeast Wet Prep HPF POC: NONE SEEN

## 2013-03-21 LAB — PREGNANCY, URINE: Preg Test, Ur: NEGATIVE

## 2013-03-21 MED ORDER — OXYCODONE-ACETAMINOPHEN 5-325 MG PO TABS
1.0000 | ORAL_TABLET | Freq: Once | ORAL | Status: AC
  Administered 2013-03-21: 1 via ORAL
  Filled 2013-03-21: qty 1

## 2013-03-21 MED ORDER — METRONIDAZOLE 250 MG PO TABS: 250.0000 mg | ORAL_TABLET | Freq: Three times a day (TID) | ORAL | Status: DC

## 2013-03-21 NOTE — ED Notes (Signed)
Pt alert & oriented x4, stable gait. Patient given discharge instructions, paperwork & prescription(s). Patient  instructed to stop at the registration desk to finish any additional paperwork. Patient verbalized understanding. Pt left department w/ no further questions. 

## 2013-03-21 NOTE — ED Notes (Signed)
Sudden onset sharp lower abdominal and back pain 7/10 beginning 1 hour ago.  Took ibuprofen 800 mg which reduced pain to a 2/10.

## 2013-03-21 NOTE — ED Notes (Signed)
Pt presents with lower abdominal pain,since 1130 this morning. Pt denies vaginal discharge, odor and pregnancy. Also c/o lower back pain. Pt states works in [employer] all day long, has never had this happen before.NAD noted at this time.

## 2013-03-21 NOTE — ED Provider Notes (Signed)
History  This chart was scribed for Raeford Razor, MD by Greggory Stallion, ED Scribe. This patient was seen in room APA03/[facility]03 and the patient's care was started at 3:06 PM.  CSN: [id number]  Arrival date & time 03/21/13  1253    Chief Complaint  Patient presents with  . Pelvic Pain  . Back Pain    The history is provided by the patient. No language interpreter was used.    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 She states it was a sharp pain when it started and it is now dull. She states she wasn't able to stand up early. LNMP was May 11. She states she is not pregnant. Pt states she has taken ibuprofen with some relief. Pt denies fever, vaginal bleeding, vaginal discharge, neck pain, sore throat, visual disturbance, CP, cough, SOB, abdominal pain, nausea, emesis, diarrhea, urinary symptoms, HA, weakness, numbness and rash as associated symptoms. She states she would like some stronger pain medication.  Past Medical History  Diagnosis Date  . Endometriosis   . 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 Abnormal Pap smear   . Sickle cell anemia     has trait  . 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 Bladder tumor excision    . Csf shunt    . Cesarean section      Family History  Problem Relation Age of Onset  . Diabetes Mother   . Cancer Mother     lung  . Hypertension Father   . Diabetes Other   . Heart disease Maternal Grandmother   . Diabetes Maternal Grandmother   . Heart disease Paternal Grandmother   . Diabetes Paternal Grandmother   . Cancer Paternal Grandfather   . Diabetes Sister   . Heart disease Sister   . Thyroid disease Sister     History  Substance Use Topics  . Smoking status: Never Smoker   . Smokeless tobacco:  Never Used  . Alcohol Use: No    OB History   Grav Para Term Preterm Abortions TAB SAB Ect Mult Living   1 1  1      1       Review of Systems  Constitutional: Negative for chills.  HENT: Negative for sore throat and neck pain.   Eyes: Negative for visual disturbance.  Respiratory: Negative for cough and shortness of breath.   Gastrointestinal: Negative for nausea, vomiting and diarrhea.  Genitourinary: Negative for difficulty urinating.  Musculoskeletal: Positive for back pain.  Skin: Negative for rash.  All other systems reviewed and are negative.    Allergies  Latex  Home Medications   Current Outpatient Rx  Name  Route  Sig  Dispense  Refill  . folic acid (FOLVITE) 800 MCG tablet   Oral   Take 800 mcg by mouth daily.         Marland Kitchen ibuprofen (ADVIL,MOTRIN) 200 MG tablet   Oral   Take 400 mg by mouth every 6 (six) hours as needed. Pain          . Multiple Vitamin (MULTIVITAMIN) tablet   Oral   Take 1 tablet by mouth daily.           BP 130/66  Pulse 84  Temp(Src) 98.6  F (37 C) (Oral)  Resp 18  Ht 5\' 2"  (1.575 m)  Wt 159 lb (72.122 kg)  BMI 29.07 kg/m2  SpO2 100%  LMP 02/19/2013  Physical Exam  Nursing note and vitals reviewed. Constitutional: She appears well-developed and well-nourished. No distress.  HENT:  Head: Normocephalic and atraumatic.  Right Ear: External ear normal.  Left Ear: External ear normal.  Eyes: Conjunctivae are normal. Right eye exhibits no discharge. Left eye exhibits no discharge. No scleral icterus.  Neck: Neck supple. No tracheal deviation present.  Cardiovascular: Normal rate, regular rhythm and intact distal pulses.   Pulmonary/Chest: Effort normal and breath sounds normal. No stridor. No respiratory distress. She has no wheezes. She has no rales.  Abdominal: Soft. Bowel sounds are normal. She exhibits no distension. There is tenderness (LLQ). There is guarding (voluntary). There is no rebound.  Genitourinary:  No CVA  tenderness. Chaperone present for pelvic exam. Normal external female genitalia. No concerning lesions noted. Moderate amount of gray discharge. No cmt. No adnexal mass or tenderness appreciated.  Musculoskeletal: She exhibits no edema and no tenderness.  Neurological: She is alert. She has normal strength. No sensory deficit. Cranial nerve deficit:  no gross defecits noted. She exhibits normal muscle tone. She displays no seizure activity. Coordination normal.  Skin: Skin is warm and dry. No rash noted.  Psychiatric: She has a normal mood and affect.    ED Course  Procedures (including critical care time)  DIAGNOSTIC STUDIES: Oxygen Saturation is 100% on RA, normal by my interpretation.    COORDINATION OF CARE: 3:17 PM-Discussed treatment plan with pt at bedside and pt agreed to plan.   Labs Reviewed  WET PREP, GENITAL - Abnormal; Notable for the following:    Clue Cells Wet Prep HPF POC MODERATE (*)    WBC, Wet Prep HPF POC FEW (*)    All other components within normal limits  GC/CHLAMYDIA PROBE AMP  CBC WITH DIFFERENTIAL  URINALYSIS, ROUTINE W REFLEX MICROSCOPIC  PREGNANCY, URINE  BASIC METABOLIC PANEL   US Transvaginal Non-ob  03/21/2013   *RADIOLOGY REPORT*  Clinical Data: Left pelvic pain and adnexal tenderness. Endometriosis.  TRANSABDOMINAL AND TRANSVAGINAL ULTRASOUND OF PELVIS  Technique:  Both transabdominal and transvaginal ultrasound examinations of the pelvis were performed.  Transabdominal technique was performed for global imaging of the pelvis including uterus, ovaries, adnexal regions, and pelvic cul-de-sac.  It was necessary to proceed with endovaginal exam following the transabdominal exam to visualize the endometrium and ovaries.  Comparison:  07/13/2009  Findings: Uterus:  11.7 x 3.9 x 6.7 cm.  Retroverted.  No fibroids identified.  Endometrium: Not well visualized on transvaginal sonography due to uterine lie.  By transabdominal sonography, endometrial thickness  measures approximately 5 mm.  Right ovary: 4.8 x 3.0 x 3.6 cm.  Normal appearance.  No adnexal mass identified.  Left ovary: 4.7 x 3.9 x 4.7 cm.  Normal appearance.  3 cm follicle noted.  No adnexal mass identified.  Other Findings:  No free fluid  IMPRESSION: Negative.  No evidence of pelvic mass or other significant abnormality.   Original Report Authenticated By: Myles Rosenthal, M.D.   US Pelvis Complete  03/21/2013   *RADIOLOGY REPORT*  Clinical Data: Left pelvic pain and adnexal tenderness. Endometriosis.  TRANSABDOMINAL AND TRANSVAGINAL ULTRASOUND OF PELVIS  Technique:  Both transabdominal and transvaginal ultrasound examinations of the pelvis were performed.  Transabdominal technique was performed for global imaging of the pelvis including uterus, ovaries, adnexal regions, and pelvic cul-de-sac.  It was necessary to proceed with endovaginal exam following the transabdominal exam to visualize the endometrium and ovaries.  Comparison:  07/13/2009  Findings: Uterus:  11.7 x 3.9 x 6.7 cm.  Retroverted.  No fibroids identified.  Endometrium: Not well visualized on transvaginal sonography due to uterine lie.  By transabdominal sonography, endometrial thickness measures approximately 5 mm.  Right ovary: 4.8 x 3.0 x 3.6 cm.  Normal appearance.  No adnexal mass identified.  Left ovary: 4.7 x 3.9 x 4.7 cm.  Normal appearance.  3 cm follicle noted.  No adnexal mass identified.  Other Findings:  No free fluid  IMPRESSION: Negative.  No evidence of pelvic mass or other significant abnormality.   Original Report Authenticated By: Myles Rosenthal, M.D.     1. Pelvic pain   2. BV (bacterial vaginosis)       MDM  31yF with Lower abdominal pain. Possibly related to BV. Low suspicion for emergent abdominal/pelvic process.       I personally preformed the services scribed in my presence. The recorded information has been reviewed is accurate. Raeford Razor, MD.   Raeford Razor, MD 03/28/13 [phone]

## 2013-03-22 LAB — GC/CHLAMYDIA PROBE AMP
CT Probe RNA: NEGATIVE
GC Probe RNA: NEGATIVE

## 2013-04-07 ENCOUNTER — Encounter: Payer: Self-pay | Admitting: Obstetrics & Gynecology

## 2013-04-07 ENCOUNTER — Ambulatory Visit (INDEPENDENT_AMBULATORY_CARE_PROVIDER_SITE_OTHER): Payer: Managed Care, Other (non HMO) | Admitting: Obstetrics & Gynecology

## 2013-04-07 VITALS — BP 140/90 | Wt 161.0 lb

## 2013-04-07 DIAGNOSIS — B977 Papillomavirus as the cause of diseases classified elsewhere: Secondary | ICD-10-CM | POA: Insufficient documentation

## 2013-04-07 DIAGNOSIS — N879 Dysplasia of cervix uteri, unspecified: Secondary | ICD-10-CM | POA: Insufficient documentation

## 2013-04-07 DIAGNOSIS — R87810 Cervical high risk human papillomavirus (HPV) DNA test positive: Secondary | ICD-10-CM

## 2013-04-07 NOTE — Progress Notes (Signed)
Patient ID: Sarah Cross, female   DOB: 1981/03/04, 32 y.o.   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

## 2013-04-07 NOTE — Patient Instructions (Addendum)
HPV Test The HPV (human papillomavirus) test is used to screen for high-risk types with HPV infection. HPV is a group of about 100 related viruses, of which 40 types are genital viruses. Most HPV viruses cause infections that usually resolve without treatment within 2 years. Some HPV infections can cause skin and genital warts (condylomata). HPV types 16, 18, 31 and 45 are considered high-risk types of HPV. High-risk types of HPV do not usually cause visible warts, but if untreated, may lead to cancers of the outlet of the womb (cervix) or anus. An HPV test identifies the DNA (genetic) strands of the HPV infection. Because the test identifies the DNA strands, the test is also referred to as the HPV DNA test. Although HPV is found in both males and females, the HPV test is only used to screen for cervical cancer in females. This test is recommended for females:  With an abnormal Pap test.  After treatment of an abnormal Pap test.  Aged 30 and older.  After treatment of a high-risk HPV infection. The HPV test may be done at the same time as a Pap test in females over the age of 30. Both the HPV and Pap test require a sample of cells from the cervix. PREPARATION FOR TEST  You may be asked to avoid douching, tampons, or vaginal medicines for 48 hours before the HPV test. You will be asked to urinate before the test. For the HPV test, you will need to lie on an exam table with your feet in stirrups. A spatula will be inserted into the vagina. The spatula will be used to swab the cervix for a cell and mucus sample. The sample will be further evaluated in a lab under a microscope. NORMAL FINDINGS  Normal: High-risk HPV is not found.  Ranges for normal findings may vary among different laboratories and hospitals. You should always check with your doctor after having lab work or other tests done to discuss the meaning of your test results and whether your values are considered within normal limits. MEANING  OF TEST An abnormal HPV test means that high-risk HPV is found. Your caregiver may recommend further testing. Your caregiver will go over the test results with you. He or she will and discuss the importance and meaning of your results, as well as treatment options and the need for additional tests, if necessary. OBTAINING THE RESULTS  It is your responsibility to obtain your test results. Ask the lab or department performing the test when and how you will get your results. Document Released: 10/23/2004 Document Revised: 12/21/2011 Document Reviewed: 07/08/2005 [employer] Patient Information 2014 [employer].  

## 2013-08-17 ENCOUNTER — Other Ambulatory Visit: Payer: Self-pay

## 2013-10-03 ENCOUNTER — Emergency Department (HOSPITAL_COMMUNITY): Payer: Managed Care, Other (non HMO)

## 2013-10-03 ENCOUNTER — Encounter (HOSPITAL_COMMUNITY): Payer: Self-pay | Admitting: Emergency Medicine

## 2013-10-03 ENCOUNTER — Emergency Department (HOSPITAL_COMMUNITY)
Admission: EM | Admit: 2013-10-03 | Discharge: 2013-10-03 | Disposition: A | Discharge status: Home / Self Care | Payer: Managed Care, Other (non HMO) | Attending: Emergency Medicine | Admitting: Emergency Medicine

## 2013-10-03 DIAGNOSIS — Z982 Presence of cerebrospinal fluid drainage device: Secondary | ICD-10-CM | POA: Insufficient documentation

## 2013-10-03 DIAGNOSIS — R079 Chest pain, unspecified: Secondary | ICD-10-CM | POA: Insufficient documentation

## 2013-10-03 DIAGNOSIS — Z862 Personal history of diseases of the blood and blood-forming organs and certain disorders involving the immune mechanism: Secondary | ICD-10-CM | POA: Insufficient documentation

## 2013-10-03 DIAGNOSIS — Z91040 Latex allergy status: Secondary | ICD-10-CM | POA: Insufficient documentation

## 2013-10-03 DIAGNOSIS — R0602 Shortness of breath: Secondary | ICD-10-CM | POA: Insufficient documentation

## 2013-10-03 DIAGNOSIS — Z86718 Personal history of other venous thrombosis and embolism: Secondary | ICD-10-CM | POA: Insufficient documentation

## 2013-10-03 DIAGNOSIS — Z21 Asymptomatic human immunodeficiency virus [HIV] infection status: Secondary | ICD-10-CM | POA: Insufficient documentation

## 2013-10-03 DIAGNOSIS — Z87728 Personal history of other specified (corrected) congenital malformations of nervous system and sense organs: Secondary | ICD-10-CM | POA: Insufficient documentation

## 2013-10-03 DIAGNOSIS — Z8742 Personal history of other diseases of the female genital tract: Secondary | ICD-10-CM | POA: Insufficient documentation

## 2013-10-03 LAB — BASIC METABOLIC PANEL
BUN: 8 mg/dL (ref 6–23)
Chloride: 98 mEq/L (ref 96–112)
Glucose, Bld: 102 mg/dL — ABNORMAL HIGH (ref 70–99)
Potassium: 3 mEq/L — ABNORMAL LOW (ref 3.5–5.1)

## 2013-10-03 LAB — CBC
HCT: 39.5 % (ref 36.0–46.0)
Hemoglobin: 13.6 g/dL (ref 12.0–15.0)
MCHC: 34.4 g/dL (ref 30.0–36.0)
WBC: 9.9 10*3/uL (ref 4.0–10.5)

## 2013-10-03 MED ORDER — OMEPRAZOLE 20 MG PO CPDR: 20.0000 mg | DELAYED_RELEASE_CAPSULE | Freq: Every day | ORAL | Status: DC

## 2013-10-03 MED ORDER — IOHEXOL 350 MG/ML SOLN
100.0000 mL | Freq: Once | INTRAVENOUS | Status: AC | PRN
  Administered 2013-10-03: 100 mL via INTRAVENOUS

## 2013-10-03 MED ORDER — ONDANSETRON HCL 4 MG/2ML IJ SOLN
INTRAMUSCULAR | Status: AC
  Administered 2013-10-03: 4 mg
  Filled 2013-10-03: qty 2

## 2013-10-03 MED ORDER — KETOROLAC TROMETHAMINE 30 MG/ML IJ SOLN
30.0000 mg | Freq: Once | INTRAMUSCULAR | Status: AC
  Administered 2013-10-03: 30 mg via INTRAVENOUS
  Filled 2013-10-03: qty 1

## 2013-10-03 NOTE — ED Notes (Signed)
Pt c/o central chest pain with sob x a few hours.

## 2013-10-03 NOTE — ED Provider Notes (Signed)
CSN: [id number]     Arrival date & time 10/03/13  1916 History  This chart was scribed for Lyanne Co, MD by Dorothey Baseman, ED Scribe. This patient was seen in room APA04/APA04 and the patient's care was started at 7:35 PM.    Chief Complaint  Patient presents with  . Chest Pain  . Shortness of Breath   The history is provided by the patient. No language interpreter was used.   HPI Comments: Sarah Cross is a 32 y.o. female with a history of sickle cell anemia and HIV who presents to the Emergency Department complaining of an intermittent, pressure-like pain to the substernal region of the chest that intermittently radiates down the right arm onset about 5.5 hours ago. She reports associated shortness of breath and some swelling to the bilateral lower legs. She denies any exacerbating or alleviating factors. Patient states that these symptoms are new for her. She denies fever, chills, abdominal pain, nausea, emesis, diarrhea. 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 Patient is a non-smoker.   Past Medical History  Diagnosis Date  . Endometriosis   . 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 Abnormal Pap smear   . Sickle cell anemia     has trait  . 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 Bladder tumor excision    . Csf shunt    . Cesarean section     Family History  Problem Relation Age of Onset  . Diabetes Mother   . Cancer Mother     lung  . Hypertension Father   . Diabetes Other   . Heart disease Maternal Grandmother   . Diabetes Maternal Grandmother   . Heart disease Paternal Grandmother   . Diabetes Paternal Grandmother   . Cancer Paternal Grandfather   . Diabetes Sister   . Heart disease Sister   . Thyroid disease Sister    History  Substance Use Topics  . Smoking status: Never Smoker   . Smokeless tobacco: Never Used   . Alcohol Use: No   OB History   Grav Para Term Preterm Abortions TAB SAB Ect Mult Living   1 1  1      1      Review of Systems  A complete 10 system review of systems was obtained and all systems are negative except as noted in the HPI and PMH.   Allergies  Latex  Home Medications   Current Outpatient Rx  Name  Route  Sig  Dispense  Refill  . ibuprofen (ADVIL,MOTRIN) 200 MG tablet   Oral   Take 400-800 mg by mouth every 6 (six) hours as needed. Pain          Triage Vitals: BP 157/107  Pulse 92  Temp(Src) 98.5 F (36.9 C) (Oral)  Resp 18  Ht 5\' 2"  (1.575 m)  Wt 160 lb (72.576 kg)  BMI 29.26 kg/m2  SpO2 99%  LMP 10/03/2013  Physical Exam  Nursing note and vitals reviewed. Constitutional: She is oriented to person, place, and time. She appears well-developed and well-nourished.  HENT:  Head: Normocephalic.  Eyes: EOM are normal.  Neck: Normal range of motion.  Cardiovascular: Normal rate, regular rhythm and normal heart sounds.   Pulmonary/Chest: Effort normal and breath sounds normal. No respiratory distress. She exhibits no tenderness.  Abdominal: She exhibits no distension.  Musculoskeletal: Normal range of motion. She exhibits no edema.  No edema, symmetric legs bilaterally.   Neurological: She is alert and oriented to person, place, and time.  Psychiatric: She has a normal mood and affect.    ED Course  Procedures (including critical care time)  DIAGNOSTIC STUDIES: Oxygen Saturation is 99% on room air, normal by my interpretation.    COORDINATION OF CARE: 7:39 PM- Ordered a chest x-ray and blood labs. Will order Toradol to manage symptoms. Discussed treatment plan with patient at bedside and patient verbalized agreement.   8:28 PM- Patient reports feeling somewhat better after receiving the medications. Ordered a CT angio chest. Ordered Zofran and Omnipaque to manage symptoms. Discussed treatment plan with patient at bedside and patient verbalized  agreement.   9:55 PM- Patient reports feeling much better after receiving the medications. Discussed that x-ray, CT, and blood lab results were normal. Will discharge patient. Discussed treatment plan with patient at bedside and patient verbalized agreement.   Labs Review Labs Reviewed  BASIC METABOLIC PANEL - Abnormal; Notable for the following:    Potassium 3.0 (*)    Glucose, Bld 102 (*)    All other components within normal limits  D-DIMER, QUANTITATIVE - Abnormal; Notable for the following:    D-Dimer, Quant 0.66 (*)    All other components within normal limits  CBC  TROPONIN I    Imaging Review Dg Chest 2 View  10/03/2013   CLINICAL DATA:  Chest pain, shortness of breath.  EXAM: CHEST  2 VIEW  COMPARISON:  None.  FINDINGS: The heart size and mediastinal contours are within normal limits. Both lungs are clear. Right-sided ventriculoperitoneal shunt is noted. No pleural effusion or pneumothorax is noted. The visualized skeletal structures are unremarkable.  IMPRESSION: No active cardiopulmonary disease.   Electronically Signed   By: Roque Lias M.D.   On: 10/03/2013 20:17   Ct Angio Chest W/cm &/or Wo Cm  10/03/2013   CLINICAL DATA:  Chest pain and shortness of breath. Previous chest surgery as a baby.  EXAM: CT ANGIOGRAPHY CHEST WITH CONTRAST  TECHNIQUE: Multidetector CT imaging of the chest was performed using the standard protocol during bolus administration of intravenous contrast. Multiplanar CT image reconstructions including MIPs were obtained to evaluate the vascular anatomy.  CONTRAST:  OMNIPAQUE IOHEXOL 350 MG/ML SOLN  COMPARISON:  Chest radiographs obtained earlier today and chest CT dated 05/22/2007.  FINDINGS: Progressive prominence of the azygos and hemiazygous veins and azygos arch. A ventriculoperitoneal shunt tube is again demonstrated. Normally opacified pulmonary arteries with no pulmonary arterial filling defects seen. Ductus clip. Interval small amount of  linear atelectasis or scarring in the right middle lobe. Diffuse low density of the liver relative to the spleen with a wedge-shaped area of sparing posteriorly. Unremarkable bones.  Review of the MIP images confirms the above findings.  IMPRESSION: 1. No pulmonary emboli or acute abnormality. 2. Interval small amount of linear atelectasis or scarring in the right middle lobe. 3. Diffuse hepatic steatosis. 4. Increased prominence of the azygos and hemiazygous veins.   Electronically Signed   By: Gordan Payment M.D.   On: 10/03/2013 21:36    EKG Interpretation    Date/Time:  Tuesday October 03 2013 19:17:55 EST Ventricular Rate:  98 PR Interval:  136 QRS Duration: 90 QT Interval:  344 QTC Calculation: 439 R Axis:   49 Text Interpretation:  Normal sinus rhythm Minimal voltage criteria for LVH, may be normal variant Nonspecific ST and T wave  abnormality Abnormal ECG No previous ECGs available Confirmed by Kaelani Kendrick  MD, Varshini Arrants ([id number]) on 10/03/2013 8:39:14 PM            MDM   1. Chest pain    Doubt ACS. No PE on CT scan. May represent GERD or esophageal spasm  I personally performed the services described in this documentation, which was scribed in my presence. The recorded information has been reviewed and is accurate.       Lyanne Co, MD 10/03/13 2159

## 2014-07-20 IMAGING — US US TRANSVAGINAL NON-OB
1 series · 14 of 25 positions shown · non-contrast
Comparison: 07/13/2009

CLINICAL DATA: Left pelvic pain and adnexal tenderness.
Endometriosis.



[Series 1: us transvaginal non-ob · 0.21mm/px · 14 of 89 slices shown]
[im 1/89]
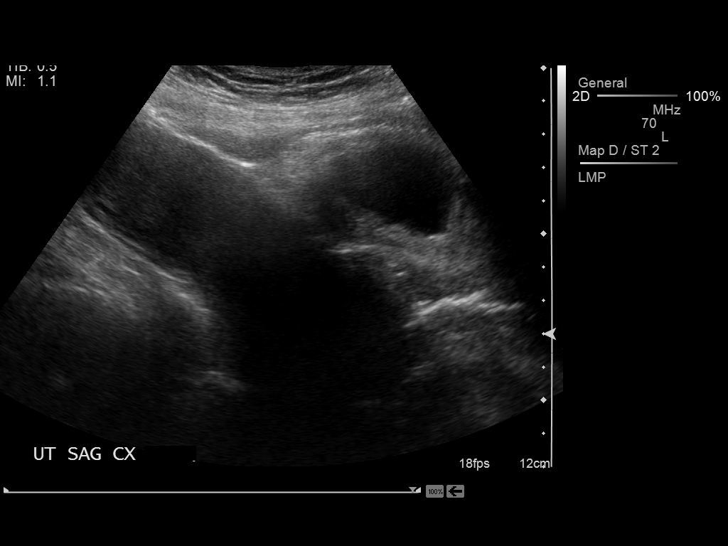
[im 8/89]
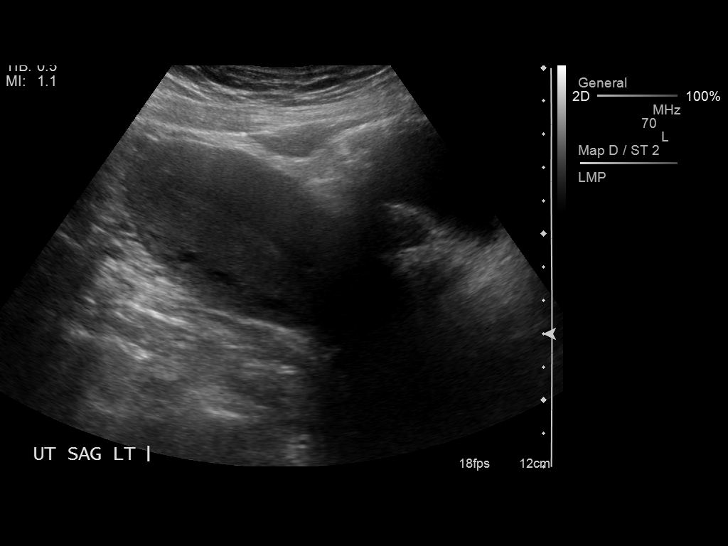
[im 15/89]
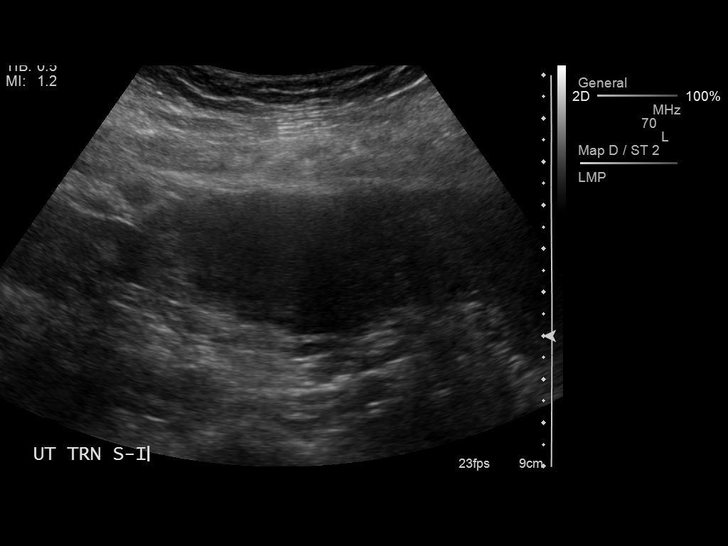
[im 23/89]
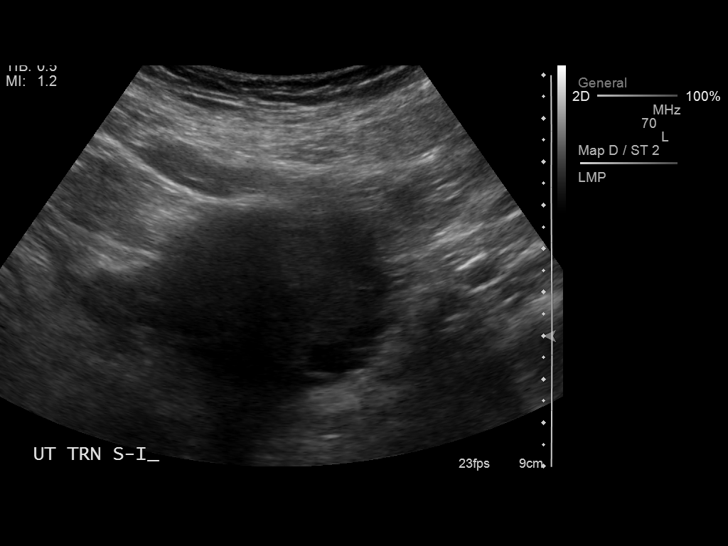
[im 30/89]
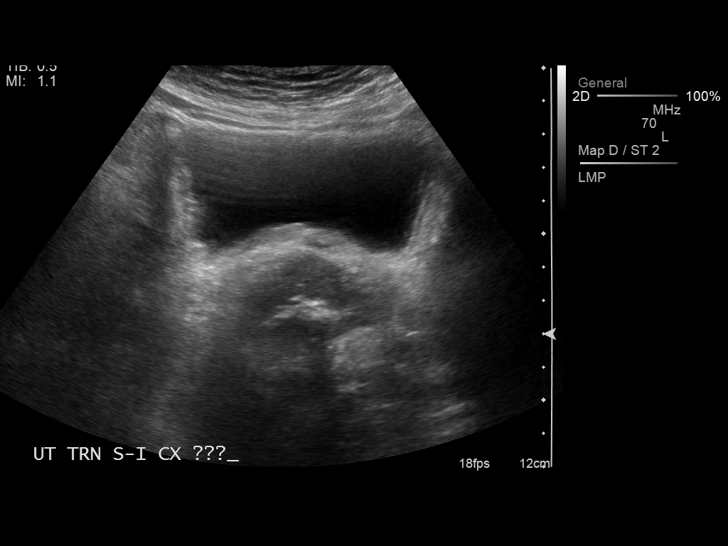
[im 34/89]
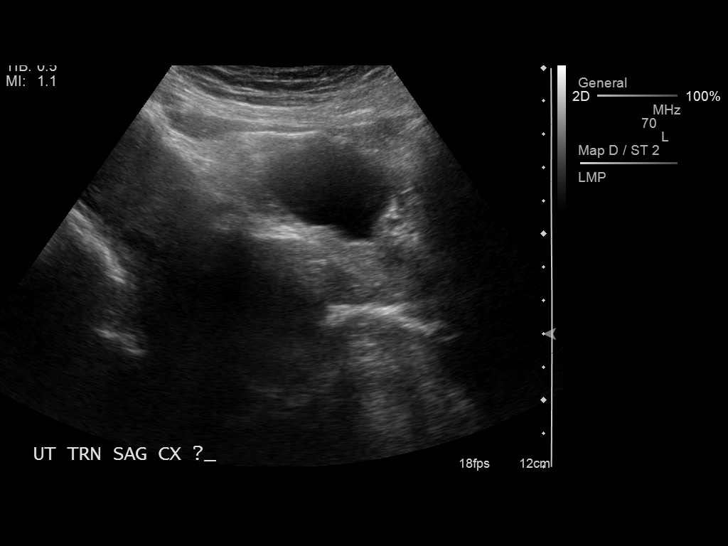
[im 41/89]
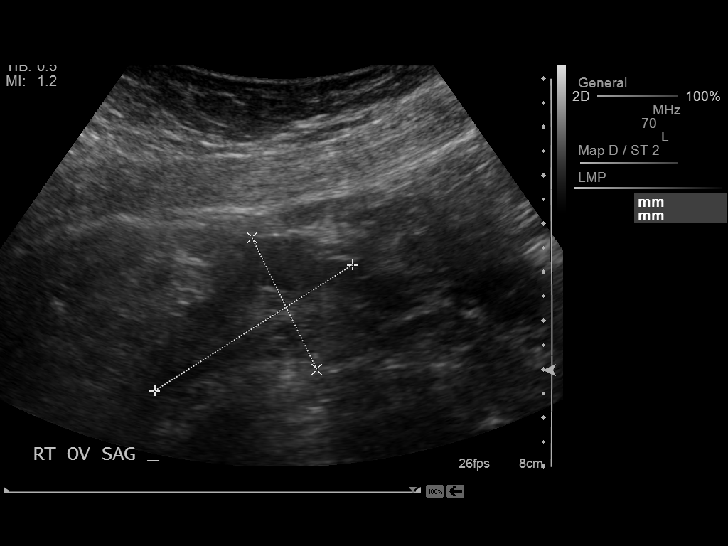
[im 48/89]
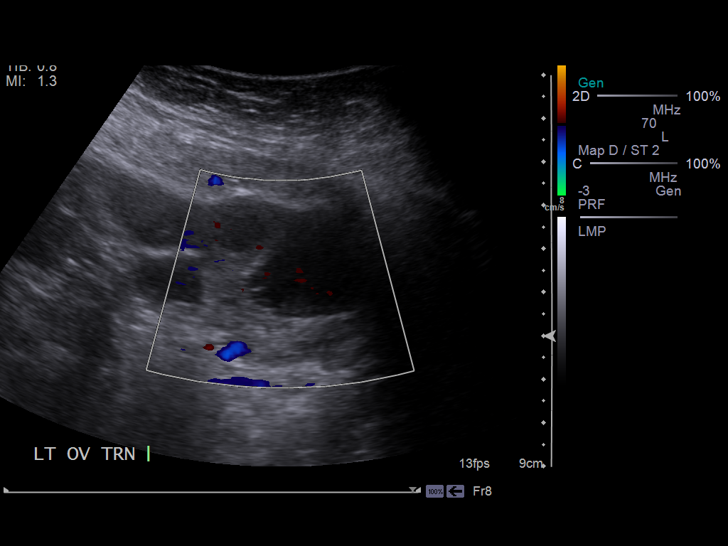
[im 56/89]
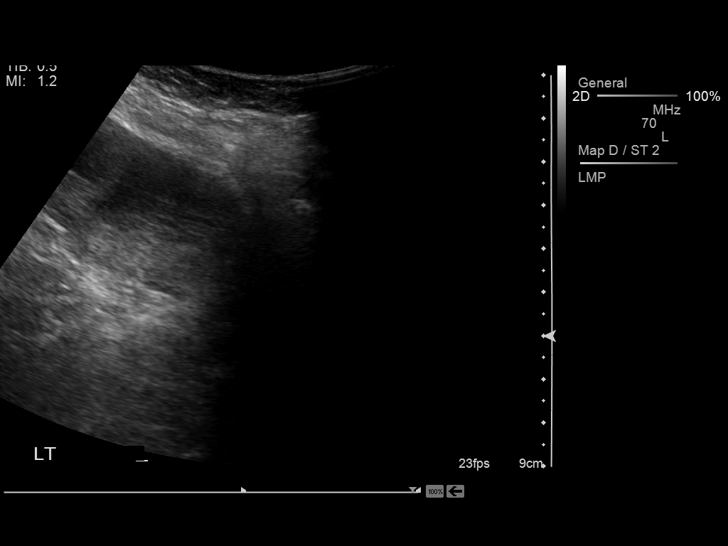
[im 59/89]
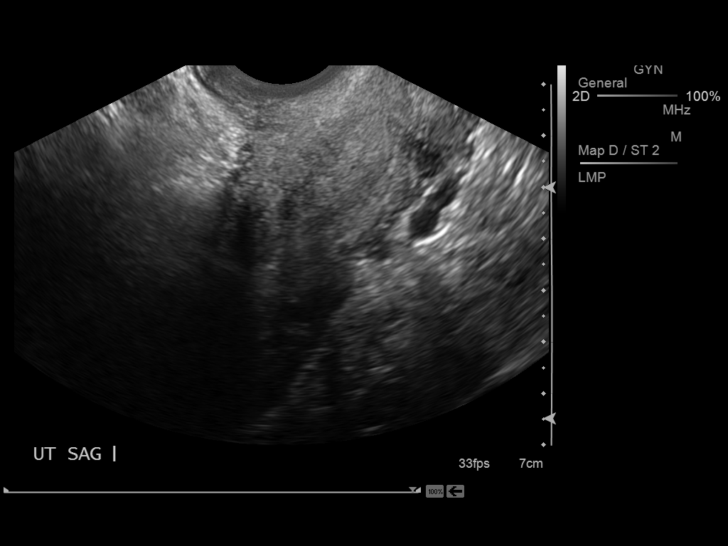
[im 67/89]
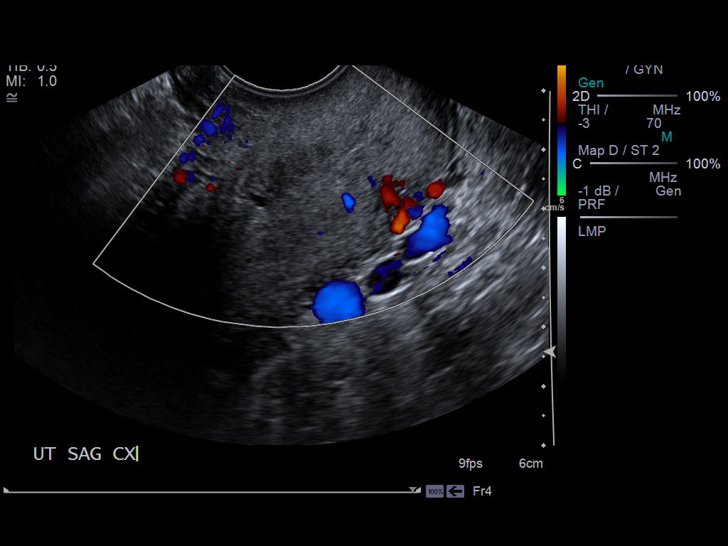
[im 74/89]
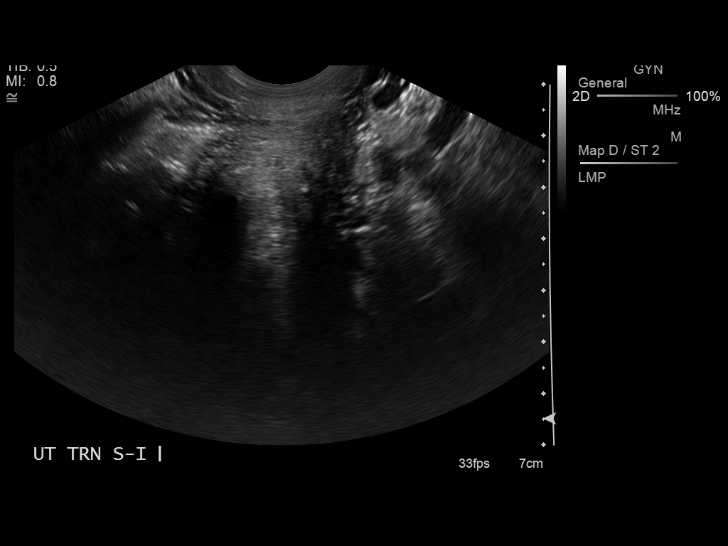
[im 81/89]
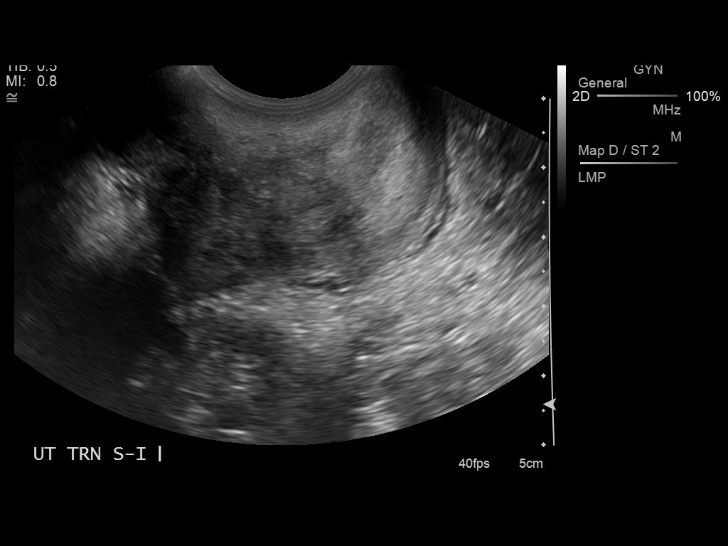
[im 89/89]
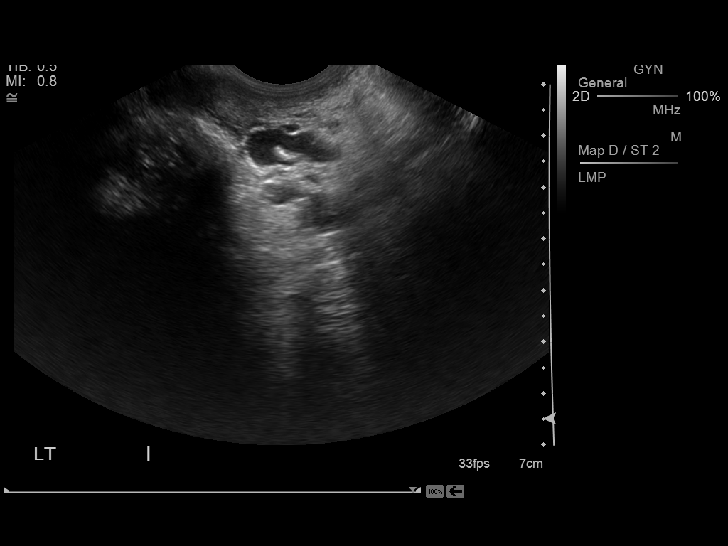

[14 of 25 positions shown; findings below may reference images not displayed]

FINDINGS: Uterus:  11.7 x 3.9 x 6.7 cm.  Retroverted.  No fibroids
identified.

Endometrium: Not well visualized on transvaginal sonography due to
uterine lie.  By transabdominal sonography, endometrial thickness
measures approximately 5 mm.

Right ovary: 4.8 x 3.0 x 3.6 cm.  Normal appearance.  No adnexal
mass identified.

Left ovary: 4.7 x 3.9 x 4.7 cm.  Normal appearance.  3 cm follicle
noted.  No adnexal mass identified.

Other Findings:  No free fluid
IMPRESSION: Negative.  No evidence of pelvic mass or other significant
abnormality.

## 2014-07-27 ENCOUNTER — Other Ambulatory Visit: Payer: Self-pay

## 2014-08-13 ENCOUNTER — Encounter (HOSPITAL_COMMUNITY): Payer: Self-pay | Admitting: Emergency Medicine

## 2014-12-12 ENCOUNTER — Encounter (HOSPITAL_COMMUNITY): Payer: Self-pay

## 2014-12-12 ENCOUNTER — Ambulatory Visit (HOSPITAL_COMMUNITY)
Admit: 2014-12-12 | Discharge: 2014-12-12 | Disposition: A | Discharge status: Home / Self Care | Payer: Managed Care, Other (non HMO) | Source: Ambulatory Visit | Attending: Emergency Medicine | Admitting: Emergency Medicine

## 2014-12-12 ENCOUNTER — Emergency Department (HOSPITAL_COMMUNITY)
Admission: EM | Admit: 2014-12-12 | Discharge: 2014-12-12 | Disposition: A | Discharge status: Home / Self Care | Payer: Managed Care, Other (non HMO) | Attending: Emergency Medicine | Admitting: Emergency Medicine

## 2014-12-12 DIAGNOSIS — M7989 Other specified soft tissue disorders: Secondary | ICD-10-CM | POA: Diagnosis not present

## 2014-12-12 DIAGNOSIS — Z8619 Personal history of other infectious and parasitic diseases: Secondary | ICD-10-CM | POA: Diagnosis not present

## 2014-12-12 DIAGNOSIS — Q039 Congenital hydrocephalus, unspecified: Secondary | ICD-10-CM | POA: Diagnosis not present

## 2014-12-12 DIAGNOSIS — Z8742 Personal history of other diseases of the female genital tract: Secondary | ICD-10-CM | POA: Insufficient documentation

## 2014-12-12 DIAGNOSIS — Z862 Personal history of diseases of the blood and blood-forming organs and certain disorders involving the immune mechanism: Secondary | ICD-10-CM | POA: Diagnosis not present

## 2014-12-12 DIAGNOSIS — Z91040 Latex allergy status: Secondary | ICD-10-CM | POA: Diagnosis not present

## 2014-12-12 DIAGNOSIS — Z79899 Other long term (current) drug therapy: Secondary | ICD-10-CM | POA: Insufficient documentation

## 2014-12-12 DIAGNOSIS — M79662 Pain in left lower leg: Secondary | ICD-10-CM | POA: Diagnosis not present

## 2014-12-12 DIAGNOSIS — M79605 Pain in left leg: Secondary | ICD-10-CM | POA: Diagnosis present

## 2014-12-12 LAB — D-DIMER, QUANTITATIVE: D-Dimer, Quant: 0.92 ug/mL-FEU — ABNORMAL HIGH (ref 0.00–0.48)

## 2014-12-12 NOTE — Discharge Instructions (Signed)
PLEASE RETURN TOMORROW FOR ULTRASOUND OF YOUR LEG RETURN EARLIER FOR WORSENED PAIN, SWELLING OR CHEST PAIN OR SHORTNESS OF BREATH

## 2014-12-12 NOTE — ED Provider Notes (Signed)
CSN: [id number]     Arrival date & time 12/12/14  0123 History   First MD Initiated Contact with Patient 12/12/14 0147     Chief Complaint  Patient presents with  . Leg Pain    Patient is a 34 y.o. female presenting with leg pain. The history is provided by the patient.  Leg Pain Location:  Leg Leg location:  L lower leg Pain details:    Quality:  Aching   Radiates to:  Does not radiate   Severity:  Mild   Onset quality:  Gradual   Duration:  3 days   Timing:  Constant   Progression:  Worsening Chronicity:  New Relieved by:  Rest Exacerbated by: movement and palpation. Associated symptoms: swelling   Associated symptoms: no fever   patient reports left calf pain for 3 days No trauma No weakness in the leg She reports swelling to left leg She reports h/o "clots" and required anticoagulants    Past Medical History  Diagnosis Date  . Endometriosis   . 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 Abnormal Pap smear   . Sickle cell anemia     has trait  . 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 Bladder tumor excision    . Csf shunt    . Cesarean section     Family History  Problem Relation Age of Onset  . Diabetes Mother   . Cancer Mother     lung  . Hypertension Father   . Diabetes Other   . Heart disease Maternal Grandmother   . Diabetes Maternal Grandmother   . Heart disease Paternal Grandmother   . Diabetes Paternal Grandmother   . Cancer Paternal Grandfather   . Diabetes Sister   . Heart disease Sister   . Thyroid disease Sister    History  Substance Use Topics  . Smoking status: Never Smoker   . Smokeless tobacco: Never Used  . Alcohol Use: No   OB History    Gravida Para Term Preterm AB TAB SAB Ectopic Multiple Living   1 1  1      1      Review of Systems  Constitutional: Negative for fever.  Respiratory: Negative for shortness of breath.   Cardiovascular: Negative for chest pain.   Gastrointestinal: Negative for vomiting.  Musculoskeletal:       Calf pain   Neurological: Negative for weakness.  All other systems reviewed and are negative.     Allergies  Latex  Home Medications   Prior to Admission medications   Medication Sig Start Date End Date Taking? Authorizing Provider  ibuprofen (ADVIL,MOTRIN) 200 MG tablet Take 400-800 mg by mouth every 6 (six) hours as needed. Pain   Yes Historical Provider, MD  Multiple Vitamins-Minerals (MULTIVITAMIN WITH MINERALS) tablet Take 1 tablet by mouth daily.   Yes Historical Provider, MD  omeprazole (PRILOSEC) 20 MG capsule Take 1 capsule (20 mg total) by mouth daily. 10/03/13  Yes Hoy Morn, MD   BP 142/90 mmHg  Pulse 82  Temp(Src) 97.9 F (36.6 C) (Oral)  Resp 20  Ht 5\' 2"  (1.575 m)  Wt 160 lb (72.576 kg)  BMI 29.26 kg/m2  SpO2 100%  LMP 11/26/2014 Physical Exam CONSTITUTIONAL: Well developed/well nourished HEAD: Normocephalic/atraumatic EYES: EOMI/PERRL ENMT: Mucous membranes moist NECK: supple no meningeal signs SPINE/BACK:entire spine nontender CV: S1/S2 noted, no murmurs/rubs/gallops noted LUNGS: Lungs are clear to auscultation  bilaterally, no apparent distress ABDOMEN: soft, nontender, no rebound or guarding, bowel sounds noted throughout abdomen NEURO: Pt is awake/alert/appropriate, moves all extremitiesx4.  No facial droop.   EXTREMITIES: pulses normal/equal, full ROM. Equal distal pulses in feet.  Mild left calf tenderness with minimal symmetric edema to both legs. Small varicose vein noted that is tender.  No erythema or crepitus or signs of trauma.   SKIN: warm, color normal PSYCH: no abnormalities of mood noted, alert and oriented to situation  ED Course  Procedures  Labs Review Labs Reviewed  D-DIMER, QUANTITATIVE - Abnormal; Notable for the following:    D-Dimer, Quant 0.92 (*)    All other components within normal limits   Pt with elevated d-dimer Will order next day DVT  study Will defer anticoagulation for now as low suspicion given history/exam   MDM   Final diagnoses:  Calf pain, left    Nursing notes including past medical history and social history reviewed and considered in documentation Labs/vital reviewed myself and considered during evaluation     Sharyon Cable, MD 12/12/14 [phone]

## 2014-12-12 NOTE — ED Provider Notes (Signed)
Patient was seen in the emergency department earlier with the complaint of left leg pain and swelling. The patient had an outpatient lower extremity venous Doppler ultrasound. There was some superficial thrombophlebitis present, but no deep vein thrombus noted. Patient made aware of ultrasound findings in terms which he understands. Patient was advised to use warm tub soaks to the lower extremity. To see her primary physician if this problem continued. The patient was advised to return to the emergency department if any emergent changes, problems, or concerns.  Lenox Ahr, PA-C 12/12/14 Sayner, PA-C 01/05/15 2004  Milton Ferguson, MD 01/07/15 [phone]

## 2014-12-12 NOTE — ED Notes (Signed)
Left calf pain x 3 days

## 2014-12-26 ENCOUNTER — Ambulatory Visit (INDEPENDENT_AMBULATORY_CARE_PROVIDER_SITE_OTHER): Payer: Managed Care, Other (non HMO) | Admitting: Adult Health

## 2014-12-26 ENCOUNTER — Encounter: Payer: Self-pay | Admitting: Adult Health

## 2014-12-26 VITALS — BP 130/82 | HR 78 | Ht 62.0 in | Wt 166.5 lb

## 2014-12-26 DIAGNOSIS — R110 Nausea: Secondary | ICD-10-CM | POA: Diagnosis not present

## 2014-12-26 DIAGNOSIS — Z3201 Encounter for pregnancy test, result positive: Secondary | ICD-10-CM | POA: Diagnosis not present

## 2014-12-26 DIAGNOSIS — O26851 Spotting complicating pregnancy, first trimester: Secondary | ICD-10-CM

## 2014-12-26 DIAGNOSIS — N926 Irregular menstruation, unspecified: Secondary | ICD-10-CM | POA: Diagnosis not present

## 2014-12-26 DIAGNOSIS — Z3490 Encounter for supervision of normal pregnancy, unspecified, unspecified trimester: Secondary | ICD-10-CM

## 2014-12-26 HISTORY — DX: Spotting complicating pregnancy, first trimester: O26.851

## 2014-12-26 HISTORY — DX: Nausea: R11.0

## 2014-12-26 HISTORY — DX: Encounter for supervision of normal pregnancy, unspecified, unspecified trimester: Z34.90

## 2014-12-26 LAB — POCT URINE PREGNANCY: Preg Test, Ur: POSITIVE

## 2014-12-26 MED ORDER — DOXYLAMINE-PYRIDOXINE 10-10 MG PO TBEC: DELAYED_RELEASE_TABLET | ORAL | Status: DC

## 2014-12-26 NOTE — Progress Notes (Signed)
Subjective:     Patient ID: Sarah Cross, female   DOB: Apr 05, 1981, 34 y.o.   MRN: [id number]  HPI Sarah Cross is a 34 year old black female in for missed period and had 2 HPT and wants UPT.Complains of nausea and being sleepy and has had spotting on and off for 3 days it is brown and has no pain, had sex this am.  Review of Systems +missed period with +UPT, +nausea, +spotting Reviewed past medical,surgical, social and family history. Reviewed medications and allergies.     Objective:   Physical Exam BP 130/82 mmHg  Pulse 78  Ht 5\' 2"  (1.575 m)  Wt 166 lb 8 oz (75.524 kg)  BMI 30.45 kg/m2  LMP 11/26/2014 UPT +, Skin warm and dry. Neck: mid line trachea, normal thyroid,  Lungs: clear to ausculation bilaterally. Cardiovascular: regular rate and rhythm.Abdomen soft non tender. About 36+[redacted] week pregnant with EDD 09/04/15 by LMP, medicaid form given.No sex, eat often will try diclegis and will get in early for [place].    Assessment:     Pregnant +UPT Nausea Spotting in first trimester     Plan:     Take flintstones 2 daily Given diclegis #36 take as directed lot [id number], exp 03/11/16 Return in 1 week for dating [place] No sex til 7 days after wiping color Review handout on first trimester and new OB packet given

## 2014-12-26 NOTE — Patient Instructions (Signed)
First Trimester of Pregnancy The first trimester of pregnancy is from week 1 until the end of week 12 (months 1 through 3). A week after a sperm fertilizes an egg, the egg will implant on the wall of the uterus. This embryo will begin to develop into a baby. Genes from you and your partner are forming the baby. The female genes determine whether the baby is a boy or a girl. At 6-8 weeks, the eyes and face are formed, and the heartbeat can be seen on ultrasound. At the end of 12 weeks, all the baby's organs are formed.  Now that you are pregnant, you will want to do everything you can to have a healthy baby. Two of the most important things are to get good prenatal care and to follow your health care provider's instructions. Prenatal care is all the medical care you receive before the baby's birth. This care will help prevent, find, and treat any problems during the pregnancy and childbirth. BODY CHANGES Your body goes through many changes during pregnancy. The changes vary from woman to woman.   You may gain or lose a couple of pounds at first.  You may feel sick to your stomach (nauseous) and throw up (vomit). If the vomiting is uncontrollable, call your health care provider.  You may tire easily.  You may develop headaches that can be relieved by medicines approved by your health care provider.  You may urinate more often. Painful urination may mean you have a bladder infection.  You may develop heartburn as a result of your pregnancy.  You may develop constipation because certain hormones are causing the muscles that push waste through your intestines to slow down.  You may develop hemorrhoids or swollen, bulging veins (varicose veins).  Your breasts may begin to grow larger and become tender. Your nipples may stick out more, and the tissue that surrounds them (areola) may become darker.  Your gums may bleed and may be sensitive to brushing and flossing.  Dark spots or blotches (chloasma,  mask of pregnancy) may develop on your face. This will likely fade after the baby is born.  Your menstrual periods will stop.  You may have a loss of appetite.  You may develop cravings for certain kinds of food.  You may have changes in your emotions from day to day, such as being excited to be pregnant or being concerned that something may go wrong with the pregnancy and baby.  You may have more vivid and strange dreams.  You may have changes in your hair. These can include thickening of your hair, rapid growth, and changes in texture. Some women also have hair loss during or after pregnancy, or hair that feels dry or thin. Your hair will most likely return to normal after your baby is born. WHAT TO EXPECT AT YOUR PRENATAL VISITS During a routine prenatal visit:  You will be weighed to make sure you and the baby are growing normally.  Your blood pressure will be taken.  Your abdomen will be measured to track your baby's growth.  The fetal heartbeat will be listened to starting around week 10 or 12 of your pregnancy.  Test results from any previous visits will be discussed. Your health care provider may ask you:  How you are feeling.  If you are feeling the baby move.  If you have had any abnormal symptoms, such as leaking fluid, bleeding, severe headaches, or abdominal cramping.  If you have any questions. Other tests   that may be performed during your first trimester include:  Blood tests to find your blood type and to check for the presence of any previous infections. They will also be used to check for low iron levels (anemia) and Rh antibodies. Later in the pregnancy, blood tests for diabetes will be done along with other tests if problems develop.  Urine tests to check for infections, diabetes, or protein in the urine.  An ultrasound to confirm the proper growth and development of the baby.  An amniocentesis to check for possible genetic problems.  Fetal screens for  spina bifida and Down syndrome.  You may need other tests to make sure you and the baby are doing well. HOME CARE INSTRUCTIONS  Medicines  Follow your health care provider's instructions regarding medicine use. Specific medicines may be either safe or unsafe to take during pregnancy.  Take your prenatal vitamins as directed.  If you develop constipation, try taking a stool softener if your health care provider approves. Diet  Eat regular, well-balanced meals. Choose a variety of foods, such as meat or vegetable-based protein, fish, milk and low-fat dairy products, vegetables, fruits, and whole grain breads and cereals. Your health care provider will help you determine the amount of weight gain that is right for you.  Avoid raw meat and uncooked cheese. These carry germs that can cause birth defects in the baby.  Eating four or five small meals rather than three large meals a day may help relieve nausea and vomiting. If you start to feel nauseous, eating a few soda crackers can be helpful. Drinking liquids between meals instead of during meals also seems to help nausea and vomiting.  If you develop constipation, eat more high-fiber foods, such as fresh vegetables or fruit and whole grains. Drink enough fluids to keep your urine clear or pale yellow. Activity and Exercise  Exercise only as directed by your health care provider. Exercising will help you:  Control your weight.  Stay in shape.  Be prepared for labor and delivery.  Experiencing pain or cramping in the lower abdomen or low back is a good sign that you should stop exercising. Check with your health care provider before continuing normal exercises.  Try to avoid standing for long periods of time. Move your legs often if you must stand in one place for a long time.  Avoid heavy lifting.  Wear low-heeled shoes, and practice good posture.  You may continue to have sex unless your health care provider directs you  otherwise. Relief of Pain or Discomfort  Wear a good support bra for breast tenderness.   Take warm sitz baths to soothe any pain or discomfort caused by hemorrhoids. Use hemorrhoid cream if your health care provider approves.   Rest with your legs elevated if you have leg cramps or low back pain.  If you develop varicose veins in your legs, wear support hose. Elevate your feet for 15 minutes, 3-4 times a day. Limit salt in your diet. Prenatal Care  Schedule your prenatal visits by the twelfth week of pregnancy. They are usually scheduled monthly at first, then more often in the last 2 months before delivery.  Write down your questions. Take them to your prenatal visits.  Keep all your prenatal visits as directed by your health care provider. Safety  Wear your seat belt at all times when driving.  Make a list of emergency phone numbers, including numbers for family, friends, the hospital, and police and fire departments. General Tips    Ask your health care provider for a referral to a local prenatal education class. Begin classes no later than at the beginning of month 6 of your pregnancy.  Ask for help if you have counseling or nutritional needs during pregnancy. Your health care provider can offer advice or refer you to specialists for help with various needs.  Do not use hot tubs, steam rooms, or saunas.  Do not douche or use tampons or scented sanitary pads.  Do not cross your legs for long periods of time.  Avoid cat litter boxes and soil used by cats. These carry germs that can cause birth defects in the baby and possibly loss of the fetus by miscarriage or stillbirth.  Avoid all smoking, herbs, alcohol, and medicines not prescribed by your health care provider. Chemicals in these affect the formation and growth of the baby.  Schedule a dentist appointment. At home, brush your teeth with a soft toothbrush and be gentle when you floss. SEEK MEDICAL CARE IF:   You have  dizziness.  You have mild pelvic cramps, pelvic pressure, or nagging pain in the abdominal area.  You have persistent nausea, vomiting, or diarrhea.  You have a bad smelling vaginal discharge.  You have pain with urination.  You notice increased swelling in your face, hands, legs, or ankles. SEEK IMMEDIATE MEDICAL CARE IF:   You have a fever.  You are leaking fluid from your vagina.  You have spotting or bleeding from your vagina.  You have severe abdominal cramping or pain.  You have rapid weight gain or loss.  You vomit blood or material that looks like coffee grounds.  You are exposed to [place] measles and have never had them.  You are exposed to fifth disease or chickenpox.  You develop a severe headache.  You have shortness of breath.  You have any kind of trauma, such as from a fall or a car accident. Document Released: 09/22/2001 Document Revised: 02/12/2014 Document Reviewed: 08/08/2013 [facility] Patient Information 2015 [place], [place]. This information is not intended to replace advice given to you by your health care provider. Make sure you discuss any questions you have with your health care provider. Return in 1 week for [place]

## 2015-01-02 ENCOUNTER — Ambulatory Visit (INDEPENDENT_AMBULATORY_CARE_PROVIDER_SITE_OTHER): Payer: Managed Care, Other (non HMO)

## 2015-01-02 ENCOUNTER — Other Ambulatory Visit: Payer: Self-pay | Admitting: Adult Health

## 2015-01-02 DIAGNOSIS — O26851 Spotting complicating pregnancy, first trimester: Secondary | ICD-10-CM

## 2015-01-02 DIAGNOSIS — Z3490 Encounter for supervision of normal pregnancy, unspecified, unspecified trimester: Secondary | ICD-10-CM

## 2015-01-02 DIAGNOSIS — O3680X0 Pregnancy with inconclusive fetal viability, not applicable or unspecified: Secondary | ICD-10-CM

## 2015-01-02 NOTE — Progress Notes (Signed)
U/S CRL 2.6cm  [redacted]w[redacted]d IUP,EDD 08/05/2015, pos fht,bilat ov simple cysts, rt ov cyst 2.8x2.7x2.7cm, lt ov cyst 3.6x3.3x3.2cm

## 2015-01-16 ENCOUNTER — Ambulatory Visit (INDEPENDENT_AMBULATORY_CARE_PROVIDER_SITE_OTHER): Payer: Managed Care, Other (non HMO) | Admitting: Women's Health

## 2015-01-16 ENCOUNTER — Encounter: Payer: Self-pay | Admitting: Women's Health

## 2015-01-16 ENCOUNTER — Other Ambulatory Visit (HOSPITAL_COMMUNITY)
Admission: RE | Admit: 2015-01-16 | Discharge: 2015-01-16 | Disposition: A | Discharge status: Home / Self Care | Payer: Managed Care, Other (non HMO) | Source: Ambulatory Visit | Attending: Obstetrics & Gynecology | Admitting: Obstetrics & Gynecology

## 2015-01-16 VITALS — BP 128/70 | HR 72 | Wt 171.0 lb

## 2015-01-16 DIAGNOSIS — Z1151 Encounter for screening for human papillomavirus (HPV): Secondary | ICD-10-CM | POA: Insufficient documentation

## 2015-01-16 DIAGNOSIS — Z331 Pregnant state, incidental: Secondary | ICD-10-CM

## 2015-01-16 DIAGNOSIS — Z113 Encounter for screening for infections with a predominantly sexual mode of transmission: Secondary | ICD-10-CM | POA: Insufficient documentation

## 2015-01-16 DIAGNOSIS — O34219 Maternal care for unspecified type scar from previous cesarean delivery: Secondary | ICD-10-CM | POA: Insufficient documentation

## 2015-01-16 DIAGNOSIS — N760 Acute vaginitis: Secondary | ICD-10-CM | POA: Diagnosis not present

## 2015-01-16 DIAGNOSIS — Z3491 Encounter for supervision of normal pregnancy, unspecified, first trimester: Secondary | ICD-10-CM | POA: Diagnosis not present

## 2015-01-16 DIAGNOSIS — A499 Bacterial infection, unspecified: Secondary | ICD-10-CM

## 2015-01-16 DIAGNOSIS — Z3682 Encounter for antenatal screening for nuchal translucency: Secondary | ICD-10-CM

## 2015-01-16 DIAGNOSIS — Z3490 Encounter for supervision of normal pregnancy, unspecified, unspecified trimester: Secondary | ICD-10-CM | POA: Insufficient documentation

## 2015-01-16 DIAGNOSIS — O09529 Supervision of elderly multigravida, unspecified trimester: Secondary | ICD-10-CM | POA: Insufficient documentation

## 2015-01-16 DIAGNOSIS — Z01419 Encounter for gynecological examination (general) (routine) without abnormal findings: Secondary | ICD-10-CM | POA: Insufficient documentation

## 2015-01-16 DIAGNOSIS — Z124 Encounter for screening for malignant neoplasm of cervix: Secondary | ICD-10-CM

## 2015-01-16 DIAGNOSIS — Z1389 Encounter for screening for other disorder: Secondary | ICD-10-CM

## 2015-01-16 DIAGNOSIS — B9689 Other specified bacterial agents as the cause of diseases classified elsewhere: Secondary | ICD-10-CM

## 2015-01-16 DIAGNOSIS — O4690 Antepartum hemorrhage, unspecified, unspecified trimester: Secondary | ICD-10-CM

## 2015-01-16 DIAGNOSIS — Z369 Encounter for antenatal screening, unspecified: Secondary | ICD-10-CM

## 2015-01-16 DIAGNOSIS — Z8751 Personal history of pre-term labor: Secondary | ICD-10-CM

## 2015-01-16 LAB — POCT URINALYSIS DIPSTICK
GLUCOSE UA: NEGATIVE
KETONES UA: NEGATIVE
Leukocytes, UA: NEGATIVE
Nitrite, UA: NEGATIVE
PROTEIN UA: NEGATIVE
RBC UA: NEGATIVE

## 2015-01-16 LAB — POCT WET PREP (WET MOUNT): CLUE CELLS WET PREP WHIFF POC: POSITIVE

## 2015-01-16 MED ORDER — METRONIDAZOLE 500 MG PO TABS: 500.0000 mg | ORAL_TABLET | Freq: Two times a day (BID) | ORAL | Status: DC

## 2015-01-16 NOTE — Patient Instructions (Signed)
Constipation  Drink plenty of fluid, preferably water, throughout the day  Eat foods high in fiber such as fruits, vegetables, and grains  Exercise, such as walking, is a good way to keep your bowels regular  Drink warm fluids, especially warm prune juice, or decaf coffee  Eat a 1/2 cup of real oatmeal (not instant), 1/2 cup applesauce, and 1/2-1 cup warm prune juice every day  If needed, you may take Colace (docusate sodium) stool softener once or twice a day to help keep the stool soft. If you are pregnant, wait until you are out of your first trimester (12-14 weeks of pregnancy)  If you still are having problems with constipation, you may take Miralax once daily as needed to help keep your bowels regular.  If you are pregnant, wait until you are out of your first trimester (12-14 weeks of pregnancy)   Nausea & Vomiting  Have saltine crackers or pretzels by your bed and eat a few bites before you raise your head out of bed in the morning  Eat small frequent meals throughout the day instead of large meals  Drink plenty of fluids throughout the day to stay hydrated, just don't drink a lot of fluids with your meals.  This can make your stomach fill up faster making you feel sick  Do not brush your teeth right after you eat  Products with real ginger are good for nausea, like ginger ale and ginger hard candy Make sure it says made with real ginger!  Sucking on sour candy like lemon heads is also good for nausea  If your prenatal vitamins make you nauseated, take them at night so you will sleep through the nausea  Sea Bands  If you feel like you need medicine for the nausea & vomiting please let us know  If you are unable to keep any fluids or food down please let us know   First Trimester of Pregnancy The first trimester of pregnancy is from week 1 until the end of week 12 (months 1 through 3). A week after a sperm fertilizes an egg, the egg will implant on the wall of the  uterus. This embryo will begin to develop into a baby. Genes from you and your partner are forming the baby. The female genes determine whether the baby is a boy or a girl. At 6-8 weeks, the eyes and face are formed, and the heartbeat can be seen on ultrasound. At the end of 12 weeks, all the baby's organs are formed.  Now that you are pregnant, you will want to do everything you can to have a healthy baby. Two of the most important things are to get good prenatal care and to follow your health care provider's instructions. Prenatal care is all the medical care you receive before the baby's birth. This care will help prevent, find, and treat any problems during the pregnancy and childbirth. BODY CHANGES Your body goes through many changes during pregnancy. The changes vary from woman to woman.  You may gain or lose a couple of pounds at first. You may feel sick to your stomach (nauseous) and throw up (vomit). If the vomiting is uncontrollable, call your health care provider. You may tire easily. You may develop headaches that can be relieved by medicines approved by your health care provider. You may urinate more often. Painful urination may mean you have a bladder infection. You may develop heartburn as a result of your pregnancy. You may develop constipation because certain  hormones are causing the muscles that push waste through your intestines to slow down. You may develop hemorrhoids or swollen, bulging veins (varicose veins). Your breasts may begin to grow larger and become tender. Your nipples may stick out more, and the tissue that surrounds them (areola) may become darker. Your gums may bleed and may be sensitive to brushing and flossing. Dark spots or blotches (chloasma, mask of pregnancy) may develop on your face. This will likely fade after the baby is born. Your menstrual periods will stop. You may have a loss of appetite. You may develop cravings for certain kinds of food. You may have  changes in your emotions from day to day, such as being excited to be pregnant or being concerned that something may go wrong with the pregnancy and baby. You may have more vivid and strange dreams. You may have changes in your hair. These can include thickening of your hair, rapid growth, and changes in texture. Some women also have hair loss during or after pregnancy, or hair that feels dry or thin. Your hair will most likely return to normal after your baby is born. WHAT TO EXPECT AT YOUR PRENATAL VISITS During a routine prenatal visit: You will be weighed to make sure you and the baby are growing normally. Your blood pressure will be taken. Your abdomen will be measured to track your baby's growth. The fetal heartbeat will be listened to starting around week 10 or 12 of your pregnancy. Test results from any previous visits will be discussed. Your health care provider may ask you: How you are feeling. If you are feeling the baby move. If you have had any abnormal symptoms, such as leaking fluid, bleeding, severe headaches, or abdominal cramping. If you have any questions. Other tests that may be performed during your first trimester include: Blood tests to find your blood type and to check for the presence of any previous infections. They will also be used to check for low iron levels (anemia) and Rh antibodies. Later in the pregnancy, blood tests for diabetes will be done along with other tests if problems develop. Urine tests to check for infections, diabetes, or protein in the urine. An ultrasound to confirm the proper growth and development of the baby. An amniocentesis to check for possible genetic problems. Fetal screens for spina bifida and Down syndrome. You may need other tests to make sure you and the baby are doing well. HOME CARE INSTRUCTIONS  Medicines Follow your health care provider's instructions regarding medicine use. Specific medicines may be either safe or unsafe to  take during pregnancy. Take your prenatal vitamins as directed. If you develop constipation, try taking a stool softener if your health care provider approves. Diet Eat regular, well-balanced meals. Choose a variety of foods, such as meat or vegetable-based protein, fish, milk and low-fat dairy products, vegetables, fruits, and whole grain breads and cereals. Your health care provider will help you determine the amount of weight gain that is right for you. Avoid raw meat and uncooked cheese. These carry germs that can cause birth defects in the baby. Eating four or five small meals rather than three large meals a day may help relieve nausea and vomiting. If you start to feel nauseous, eating a few soda crackers can be helpful. Drinking liquids between meals instead of during meals also seems to help nausea and vomiting. If you develop constipation, eat more high-fiber foods, such as fresh vegetables or fruit and whole grains. Drink enough fluids to  keep your urine clear or pale yellow. Activity and Exercise Exercise only as directed by your health care provider. Exercising will help you: Control your weight. Stay in shape. Be prepared for labor and delivery. Experiencing pain or cramping in the lower abdomen or low back is a good sign that you should stop exercising. Check with your health care provider before continuing normal exercises. Try to avoid standing for long periods of time. Move your legs often if you must stand in one place for a long time. Avoid heavy lifting. Wear low-heeled shoes, and practice good posture. You may continue to have sex unless your health care provider directs you otherwise. Relief of Pain or Discomfort Wear a good support bra for breast tenderness.  Take warm sitz baths to soothe any pain or discomfort caused by hemorrhoids. Use hemorrhoid cream if your health care provider approves.  Rest with your legs elevated if you have leg cramps or low back pain. If you  develop varicose veins in your legs, wear support hose. Elevate your feet for 15 minutes, 3-4 times a day. Limit salt in your diet. Prenatal Care Schedule your prenatal visits by the twelfth week of pregnancy. They are usually scheduled monthly at first, then more often in the last 2 months before delivery. Write down your questions. Take them to your prenatal visits. Keep all your prenatal visits as directed by your health care provider. Safety Wear your seat belt at all times when driving. Make a list of emergency phone numbers, including numbers for family, friends, the hospital, and police and fire departments. General Tips Ask your health care provider for a referral to a local prenatal education class. Begin classes no later than at the beginning of month 6 of your pregnancy. Ask for help if you have counseling or nutritional needs during pregnancy. Your health care provider can offer advice or refer you to specialists for help with various needs. Do not use hot tubs, steam rooms, or saunas. Do not douche or use tampons or scented sanitary pads. Do not cross your legs for long periods of time. Avoid cat litter boxes and soil used by cats. These carry germs that can cause birth defects in the baby and possibly loss of the fetus by miscarriage or stillbirth. Avoid all smoking, herbs, alcohol, and medicines not prescribed by your health care provider. Chemicals in these affect the formation and growth of the baby. Schedule a dentist appointment. At home, brush your teeth with a soft toothbrush and be gentle when you floss. SEEK MEDICAL CARE IF:  You have dizziness. You have mild pelvic cramps, pelvic pressure, or nagging pain in the abdominal area. You have persistent nausea, vomiting, or diarrhea. You have a bad smelling vaginal discharge. You have pain with urination. You notice increased swelling in your face, hands, legs, or ankles. SEEK IMMEDIATE MEDICAL CARE IF:  You have a  fever. You are leaking fluid from your vagina. You have spotting or bleeding from your vagina. You have severe abdominal cramping or pain. You have rapid weight gain or loss. You vomit blood or material that looks like coffee grounds. You are exposed to [place] measles and have never had them. You are exposed to fifth disease or chickenpox. You develop a severe headache. You have shortness of breath. You have any kind of trauma, such as from a fall or a car accident. Document Released: 09/22/2001 Document Revised: 02/12/2014 Document Reviewed: 08/08/2013 [facility] Patient Information 2015 [place], [place]. This information is not intended to  replace advice given to you by your health care provider. Make sure you discuss any questions you have with your health care provider.  

## 2015-01-16 NOTE — Progress Notes (Addendum)
Subjective:  Sarah Cross is a 34 y.o. G25P0101 African American female at [redacted]w[redacted]d by 9wk u/s, being seen today for her first obstetrical visit.  Her obstetrical history is significant for AMA, h/o c/s for NRFHR, h/o PTB @ 35wks d/t PPROM.  Pregnancy history fully reviewed.  Patient reports occ vb brown to red, no cramping, +odor. Some constipation. Denies vb, cramping, uti s/s, abnormal/malodorous vag d/c, or vulvovaginal itching/irritation.  BP 128/70 mmHg  Pulse 72  Wt 171 lb (77.565 kg)  LMP 11/26/2014  HISTORY: OB History  Gravida Para Term Preterm AB SAB TAB Ectopic Multiple Living  2 1  1      1     # Outcome Date GA Lbr Len/2nd Weight Sex Delivery Anes PTL Lv  2 Current           1 Preterm 08/16/04 [redacted]w[redacted]d  6 lb 8 oz (2.948 kg) M CS-LTranv  Y Y     Past Medical History  Diagnosis Date  . Endometriosis   . 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 Abnormal Pap smear   . Sickle cell anemia     has trait  . 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 Pregnant 12/26/2014  . Vaginal Pap smear, abnormal   . Nausea 12/26/2014  . Spotting during pregnancy in first trimester 12/26/2014   Past Surgical History  Procedure Laterality Date  . Bladder tumor excision    . Csf shunt    . Cesarean section     Family History  Problem Relation Age of Onset  . Diabetes Mother   . Cancer Mother     lung  . Hypertension Father   . Heart disease Maternal Grandmother   . Diabetes Maternal Grandmother   . Heart disease Paternal Grandmother   . Diabetes Paternal Grandmother   . Cancer Paternal Grandfather   . Diabetes Sister   . Heart disease Sister   . Thyroid disease Sister     Exam   System:     General: Well developed & nourished, no acute distress   Skin: Warm & dry, normal coloration and turgor, no rashes   Neurologic: Alert & oriented, normal mood   Cardiovascular: Regular rate & rhythm   Respiratory: Effort & rate normal, LCTAB, acyanotic   Abdomen: Soft, non tender    Extremities: normal strength, tone   Pelvic Exam:    Perineum: Normal perineum   Vulva: Normal, no lesions   Vagina:  Normal mucosa, dark brown foul smelling d/c   Cervix: Normal, bulbous, appears closed   Uterus: Normal size/shape/contour for GA   Thin prep pap smear obtained w/ high risk HPV cotesting FHR: +u/s  Results for orders placed or performed in visit on 01/16/15 (from the past 24 hour(s))  POCT Urinalysis Dipstick     Status: None   Collection Time: 01/16/15  3:31 PM  Result Value Ref Range   Color, UA     Clarity, UA     Glucose, UA neg    Bilirubin, UA     Ketones, UA neg    Spec Grav, UA     Blood, UA neg    pH, UA     Protein, UA neg    Urobilinogen, UA     Nitrite, UA neg    Leukocytes, UA Negative   POCT Wet Prep [facility] [place])     Status: Abnormal   Collection Time: 01/16/15  4:23 PM  Result Value Ref Range  Source Wet Prep POC vaginal    WBC, Wet Prep HPF POC mod    Bacteria Wet Prep HPF POC none    BACTERIA WET PREP MORPHOLOGY POC     Clue Cells Wet Prep HPF POC Many    Clue Cells Wet Prep Whiff POC Positive Whiff    Yeast Wet Prep HPF POC None    KOH Wet Prep POC     Trichomonas Wet Prep HPF POC none       Assessment:   Pregnancy: G2P0101 Patient Active Problem List   Diagnosis Date Noted  . Supervision of normal pregnancy 01/16/2015    Priority: High  . AMA (advanced maternal age) multigravida 35+ 01/16/2015    Priority: High  . History of preterm delivery 01/16/2015    Priority: High  . H/O cesarean section complicating pregnancy 81/82/9937    Priority: High  . Nausea 12/26/2014  . Spotting during pregnancy in first trimester 12/26/2014  . 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 Dysplasia of cervix, unspecified 04/07/2013  . 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 Endometriosis 02/13/2013    [redacted]w[redacted]d G2P0101 New OB visit VB during 1st trimester H/o c/s H/o 35wk ptb d/t pprom AMA BV  Plan:  Initial labs drawn Continue prenatal  vitamins Problem list reviewed and updated Reviewed n/v relief measures and warning s/s to report Reviewed recommended weight gain based on pre-gravid BMI Encouraged well-balanced diet Genetic Screening discussed Integrated Screen: requested Cystic fibrosis screening discussed requested Ultrasound discussed; fetal survey: requested Follow up in 1 weeks for visit and 1st it/nt CCNC completed Work note given to stay out until Monday Pelvic rest rx flagyl for bv Liliane Bade, wants- ordered 4/6  Tawnya Crook CNM, [facility] 01/16/2015 4:22 PM

## 2015-01-18 LAB — HEMOGLOBINOPATHY EVALUATION
HEMOGLOBIN A2 QUANTITATION: 3.6 % — AB (ref 0.7–3.1)
HGB C: 0 %
HGB S: 38.4 % — AB
Hemoglobin F Quantitation: 0 % (ref 0.0–2.0)
Hgb A: 58 % — ABNORMAL LOW (ref 94.0–98.0)

## 2015-01-18 LAB — PMP SCREEN PROFILE (10S), URINE
AMPHETAMINE SCRN UR: NEGATIVE ng/mL
BARBITURATE SCRN UR: NEGATIVE ng/mL
Benzodiazepine Screen, Urine: NEGATIVE ng/mL
Cannabinoids Ur Ql Scn: NEGATIVE ng/mL
Cocaine(Metab.)Screen, Urine: NEGATIVE ng/mL
Creatinine(Crt), U: 101.5 mg/dL (ref 20.0–300.0)
Methadone Scn, Ur: NEGATIVE ng/mL
Opiate Scrn, Ur: NEGATIVE ng/mL
Oxycodone+Oxymorphone Ur Ql Scn: NEGATIVE ng/mL
PCP SCRN UR: NEGATIVE ng/mL
PROPOXYPHENE SCREEN: NEGATIVE ng/mL
Ph of Urine: 5.6 (ref 4.5–8.9)

## 2015-01-18 LAB — VARICELLA ZOSTER ANTIBODY, IGG: Varicella zoster IgG: 2374 index (ref >165)

## 2015-01-18 LAB — MICROSCOPIC EXAMINATION: CASTS: NONE SEEN /LPF

## 2015-01-18 LAB — CBC
HCT: 35.4 % (ref 34.0–46.6)
Hemoglobin: 12.5 g/dL (ref 11.1–15.9)
MCH: 28.2 pg (ref 26.6–33.0)
MCHC: 35.3 g/dL (ref 31.5–35.7)
MCV: 80 fL (ref 79–97)
PLATELETS: 277 10*3/uL (ref 150–379)
RBC: 4.43 x10E6/uL (ref 3.77–5.28)
RDW: 14.8 % (ref 12.3–15.4)
WBC: 10.3 10*3/uL (ref 3.4–10.8)

## 2015-01-18 LAB — URINALYSIS, ROUTINE W REFLEX MICROSCOPIC
Bilirubin, UA: NEGATIVE
Glucose, UA: NEGATIVE
KETONES UA: NEGATIVE
NITRITE UA: NEGATIVE
Protein, UA: NEGATIVE
Specific Gravity, UA: 1.015 (ref 1.005–1.030)
UUROB: 1 mg/dL (ref 0.2–1.0)
pH, UA: 6 (ref 5.0–7.5)

## 2015-01-18 LAB — RPR: RPR Ser Ql: NONREACTIVE

## 2015-01-18 LAB — SICKLE CELL SCREEN: SICKLE CELL SCREEN: POSITIVE — AB

## 2015-01-18 LAB — URINE CULTURE

## 2015-01-18 LAB — HEPATITIS B SURFACE ANTIGEN: Hepatitis B Surface Ag: NEGATIVE

## 2015-01-18 LAB — ANTIBODY SCREEN: Antibody Screen: NEGATIVE

## 2015-01-18 LAB — CYTOLOGY - PAP

## 2015-01-18 LAB — HIV ANTIBODY (ROUTINE TESTING W REFLEX): HIV Screen 4th Generation wRfx: NONREACTIVE

## 2015-01-18 LAB — ABO/RH: Rh Factor: POSITIVE

## 2015-01-18 LAB — RUBELLA SCREEN: Rubella Antibodies, IGG: 2.22 index (ref >0.99)

## 2015-01-19 ENCOUNTER — Encounter: Payer: Self-pay | Admitting: Women's Health

## 2015-01-19 DIAGNOSIS — O98211 Gonorrhea complicating pregnancy, first trimester: Secondary | ICD-10-CM | POA: Insufficient documentation

## 2015-01-19 DIAGNOSIS — D573 Sickle-cell trait: Secondary | ICD-10-CM | POA: Insufficient documentation

## 2015-01-21 ENCOUNTER — Telehealth: Payer: Self-pay | Admitting: Women's Health

## 2015-01-21 MED ORDER — AZITHROMYCIN 500 MG PO TABS: 1000.0000 mg | ORAL_TABLET | Freq: Once | ORAL | Status: DC

## 2015-01-21 NOTE — Telephone Encounter (Signed)
LM for pt to return call- need to notify & tx +GC.  Roma Schanz, CNM, [facility] 01/21/2015 5:06 PM

## 2015-01-22 ENCOUNTER — Telehealth: Payer: Self-pay | Admitting: Women's Health

## 2015-01-22 LAB — CYSTIC FIBROSIS MUTATION 97: GENE DIS ANAL CARRIER INTERP BLD/T-IMP: NOT DETECTED

## 2015-01-22 NOTE — Telephone Encounter (Signed)
Pt returned call. Aware she is sickle cell trait +, unsure of fob's status- if he's never been tested can go to his PCP or come to [facility] for testing. Notified of +GC, rx for azithromycin 1gm po x 1 sent to her pharmacy yesterday- and she is coming in 4/14 for visit so can get Rocephin 250mg  IM x 1 then. Partner will need to go to PCP or HD for tx. No sex x at least 7d from both being tx, will do poc in 4wks.  Roma Schanz, CNM, [facility] 01/22/2015 11:47 AM

## 2015-01-22 NOTE — Telephone Encounter (Signed)
LM for pt to return call so I can notify of +GC and need for tx.  Roma Schanz, CNM, [facility] 01/22/2015 10:44 AM

## 2015-01-24 ENCOUNTER — Ambulatory Visit (INDEPENDENT_AMBULATORY_CARE_PROVIDER_SITE_OTHER): Payer: Managed Care, Other (non HMO) | Admitting: Obstetrics & Gynecology

## 2015-01-24 ENCOUNTER — Encounter: Payer: Self-pay | Admitting: Obstetrics & Gynecology

## 2015-01-24 ENCOUNTER — Ambulatory Visit (INDEPENDENT_AMBULATORY_CARE_PROVIDER_SITE_OTHER): Payer: Managed Care, Other (non HMO)

## 2015-01-24 VITALS — BP 136/86 | HR 76 | Wt 171.0 lb

## 2015-01-24 DIAGNOSIS — Z36 Encounter for antenatal screening of mother: Secondary | ICD-10-CM

## 2015-01-24 DIAGNOSIS — Z331 Pregnant state, incidental: Secondary | ICD-10-CM | POA: Diagnosis not present

## 2015-01-24 DIAGNOSIS — Z3491 Encounter for supervision of normal pregnancy, unspecified, first trimester: Secondary | ICD-10-CM

## 2015-01-24 DIAGNOSIS — Z3682 Encounter for antenatal screening for nuchal translucency: Secondary | ICD-10-CM

## 2015-01-24 DIAGNOSIS — A549 Gonococcal infection, unspecified: Secondary | ICD-10-CM | POA: Diagnosis not present

## 2015-01-24 DIAGNOSIS — Z1389 Encounter for screening for other disorder: Secondary | ICD-10-CM

## 2015-01-24 DIAGNOSIS — Z8751 Personal history of pre-term labor: Secondary | ICD-10-CM

## 2015-01-24 DIAGNOSIS — Z369 Encounter for antenatal screening, unspecified: Secondary | ICD-10-CM

## 2015-01-24 LAB — POCT URINALYSIS DIPSTICK
Blood, UA: NEGATIVE
Glucose, UA: NEGATIVE
Ketones, UA: NEGATIVE
LEUKOCYTES UA: NEGATIVE
NITRITE UA: NEGATIVE
Protein, UA: NEGATIVE

## 2015-01-24 MED ORDER — CEFTRIAXONE SODIUM 1 G IJ SOLR
250.0000 mg | Freq: Once | INTRAMUSCULAR | Status: AC
  Administered 2015-01-24: 250 mg via INTRAMUSCULAR

## 2015-01-24 NOTE — Progress Notes (Signed)
NT [place] completed today at 12+[redacted] weeks GA.  Single, active fetus with FHR of 178 bpm.  CRL of 66.47mm which is consistent with dating. NT measures 1.6mm and nasal bone is present.  Right ovary is normal.  Simple cyst on left ovary measures 3.1 x 3.0 x 3.0cm.

## 2015-01-24 NOTE — Progress Notes (Signed)
[id number] [redacted]w[redacted]d Estimated Date of Delivery: 08/05/15  Blood pressure 136/86, pulse 76, weight 171 lb (77.565 kg), last menstrual period 11/26/2014.   BP weight and urine results all reviewed and noted.  Please refer to the obstetrical flow sheet for the fundal height and fetal heart rate documentation:  Patient reports good fetal movement, denies any bleeding and no rupture of membranes symptoms or regular contractions. Patient is without complaints. All questions were answered.  Plan:  Continued routine obstetrical care, **  Follow up in 4 weeks for OB appointment, 2 IT  Rocephin 250 IM today 1st IT today

## 2015-01-26 LAB — MATERNAL SCREEN, INTEGRATED #1
Crown Rump Length: 66.5 mm
GEST. AGE ON COLLECTION DATE: 12.9 wk
Maternal Age at EDD: 36.3 years
NUCHAL TRANSLUCENCY (NT): 1.1 mm
Number of Fetuses: 1
PAPP-A Value: 334.6 ng/mL
Weight: 170 [lb_av]

## 2015-01-28 ENCOUNTER — Encounter: Payer: Self-pay | Admitting: Women's Health

## 2015-01-28 DIAGNOSIS — N83202 Unspecified ovarian cyst, left side: Secondary | ICD-10-CM | POA: Insufficient documentation

## 2015-02-13 ENCOUNTER — Telehealth: Payer: Self-pay | Admitting: *Deleted

## 2015-02-13 NOTE — Telephone Encounter (Signed)
Spoke with Diane @ [facility] and gave her the Authorization # [id number]  Good from 02/07/15 - 07/09/15 from [facility].

## 2015-02-21 ENCOUNTER — Ambulatory Visit (INDEPENDENT_AMBULATORY_CARE_PROVIDER_SITE_OTHER): Payer: Managed Care, Other (non HMO) | Admitting: Advanced Practice Midwife

## 2015-02-21 VITALS — BP 128/90 | HR 76 | Wt 173.0 lb

## 2015-02-21 DIAGNOSIS — A749 Chlamydial infection, unspecified: Secondary | ICD-10-CM

## 2015-02-21 DIAGNOSIS — O09892 Supervision of other high risk pregnancies, second trimester: Secondary | ICD-10-CM

## 2015-02-21 DIAGNOSIS — Z363 Encounter for antenatal screening for malformations: Secondary | ICD-10-CM

## 2015-02-21 DIAGNOSIS — O09212 Supervision of pregnancy with history of pre-term labor, second trimester: Secondary | ICD-10-CM

## 2015-02-21 DIAGNOSIS — Z369 Encounter for antenatal screening, unspecified: Secondary | ICD-10-CM

## 2015-02-21 DIAGNOSIS — Z3492 Encounter for supervision of normal pregnancy, unspecified, second trimester: Secondary | ICD-10-CM

## 2015-02-21 DIAGNOSIS — Z8751 Personal history of pre-term labor: Secondary | ICD-10-CM

## 2015-02-21 MED ORDER — HYDROXYPROGESTERONE CAPROATE 250 MG/ML IM OIL
250.0000 mg | TOPICAL_OIL | Freq: Once | INTRAMUSCULAR | Status: AC
  Administered 2015-02-21: 250 mg via INTRAMUSCULAR

## 2015-02-21 NOTE — Addendum Note (Signed)
Addended by: Doyne Keel on: 02/21/2015 05:01 PM   Modules accepted: Orders

## 2015-02-21 NOTE — Progress Notes (Signed)
[id number] [redacted]w[redacted]d Estimated Date of Delivery: 08/05/15  Last menstrual period 11/26/2014.   BP weight and urine results all reviewed and noted.  Please refer to the obstetrical flow sheet for the fundal height and fetal heart rate documentation:  Patient denies any bleeding and no rupture of membranes symptoms or regular contractions. Patient c/o dizziness "all the time"  Tips given All questions were answered.  Plan:  Continued routine obstetrical care, POC GC today; 2nd IT  Follow up in 3 weeks for OB appointment, anatomy scan

## 2015-02-23 ENCOUNTER — Other Ambulatory Visit: Payer: Self-pay | Admitting: Advanced Practice Midwife

## 2015-02-23 DIAGNOSIS — Z363 Encounter for antenatal screening for malformations: Secondary | ICD-10-CM

## 2015-02-23 DIAGNOSIS — O289 Unspecified abnormal findings on antenatal screening of mother: Secondary | ICD-10-CM | POA: Insufficient documentation

## 2015-02-23 LAB — MATERNAL SCREEN, INTEGRATED #2
ADSF: 0.34
AFP MoM: 3.06
Alpha-Fetoprotein: 107.5 ng/mL
Crown Rump Length: 66.5 mm
DIA MOM: 3.43
DIA VALUE: 563.7 pg/mL
ESTRIOL UNCONJUGATED: 0.33 ng/mL
GESTATIONAL AGE: 16.9 wk
Gest. Age on Collection Date: 12.9 weeks
HCG VALUE: 116.7 [IU]/mL
MATERNAL AGE AT EDD: 36.3 a
NUCHAL TRANSLUCENCY MOM: 0.75
NUMBER OF FETUSES: 1
Nuchal Translucency (NT): 1.1 mm
PAPP-A MoM: 0.34
PAPP-A Value: 334.6 ng/mL
TEST RESULTS: POSITIVE — AB
WEIGHT: 173 [lb_av]
Weight: 170 [lb_av]
hCG MoM: 4.38

## 2015-02-25 ENCOUNTER — Other Ambulatory Visit: Payer: Self-pay | Admitting: Advanced Practice Midwife

## 2015-02-25 DIAGNOSIS — O09522 Supervision of elderly multigravida, second trimester: Secondary | ICD-10-CM

## 2015-02-25 DIAGNOSIS — D573 Sickle-cell trait: Secondary | ICD-10-CM

## 2015-02-25 DIAGNOSIS — O281 Abnormal biochemical finding on antenatal screening of mother: Secondary | ICD-10-CM

## 2015-02-25 LAB — GC/CHLAMYDIA PROBE AMP
CHLAMYDIA, DNA PROBE: NEGATIVE
NEISSERIA GONORRHOEAE BY PCR: NEGATIVE

## 2015-02-25 NOTE — Progress Notes (Signed)
02/25/2015 left message for pt to call for test results (abnormal NT/IT).  Per [facility]:  [employer] [place] 5/18 at 1530, informasique ordered in epic.  CRESENZO-DISHMAN,Omauri Boeve

## 2015-02-27 ENCOUNTER — Ambulatory Visit (INDEPENDENT_AMBULATORY_CARE_PROVIDER_SITE_OTHER): Payer: Managed Care, Other (non HMO) | Admitting: Obstetrics and Gynecology

## 2015-02-27 ENCOUNTER — Ambulatory Visit (INDEPENDENT_AMBULATORY_CARE_PROVIDER_SITE_OTHER): Payer: Managed Care, Other (non HMO)

## 2015-02-27 DIAGNOSIS — O289 Unspecified abnormal findings on antenatal screening of mother: Secondary | ICD-10-CM

## 2015-02-27 DIAGNOSIS — O281 Abnormal biochemical finding on antenatal screening of mother: Secondary | ICD-10-CM | POA: Diagnosis not present

## 2015-02-27 DIAGNOSIS — Z8751 Personal history of pre-term labor: Secondary | ICD-10-CM

## 2015-02-27 DIAGNOSIS — O09522 Supervision of elderly multigravida, second trimester: Secondary | ICD-10-CM

## 2015-02-27 DIAGNOSIS — D573 Sickle-cell trait: Secondary | ICD-10-CM | POA: Diagnosis not present

## 2015-02-27 DIAGNOSIS — Z3492 Encounter for supervision of normal pregnancy, unspecified, second trimester: Secondary | ICD-10-CM

## 2015-02-27 NOTE — Progress Notes (Signed)
Anatomy US attempted today.  Early attempt due to abnormal biochemical screen and high risk factors.  (1:140 ONDR and 1:6 DSR). Single, active fetus in a cephalic presentation.  Large appearing posterior placenta (4.7cm thick). Placenta cord insertion not well visualized. SVP 3.7cm. Bilateral ovaries appear normal. EFW today 158 g which is consistent with dating. Very technically limited today due to body habitus and early GA.  Dr. Glo Herring spoke with patient and decided to have InformaSeq drawn today and repeat US in 2 weeks.

## 2015-02-27 NOTE — Progress Notes (Addendum)
Patient ID: Sarah Cross, female   DOB: May 22, 1981, 34 y.o.   MRN: [id number]  [id number] [redacted]w[redacted]d Estimated Date of Delivery: 08/05/15  Last menstrual period 11/26/2014.   refer to the ob flow sheet for FH and FHR, also BP, Wt, Urine results:notable for ultrasound done today due to ELEVATED RISK ONTD 1:140 AND DSR 1;6. Unable to get optimal pictures.  Patient complaints:none.  Questions were answered. Pt will be rescheduled for u/s in 2 wk Assessment:  Plan:  Continued routine obstetrical care, with INFORMASEQ ordered today  F/u in 2 weeks for anatomy scan at 19.2    This patient was seen in room I personally performed the services described in this documentation, which was SCRIBED by ME :). The recorded information has been reviewed and considered accurate. It has been edited as necessary during review. Jonnie Kind, MD   and the patient's care was started at 4:37 PM.

## 2015-02-28 ENCOUNTER — Ambulatory Visit (INDEPENDENT_AMBULATORY_CARE_PROVIDER_SITE_OTHER): Payer: Managed Care, Other (non HMO) | Admitting: *Deleted

## 2015-02-28 ENCOUNTER — Encounter: Payer: Self-pay | Admitting: *Deleted

## 2015-02-28 VITALS — BP 120/78 | HR 68 | Ht 62.0 in | Wt 173.0 lb

## 2015-02-28 DIAGNOSIS — O289 Unspecified abnormal findings on antenatal screening of mother: Secondary | ICD-10-CM

## 2015-02-28 DIAGNOSIS — Z8751 Personal history of pre-term labor: Secondary | ICD-10-CM

## 2015-02-28 DIAGNOSIS — Z1389 Encounter for screening for other disorder: Secondary | ICD-10-CM

## 2015-02-28 DIAGNOSIS — O09892 Supervision of other high risk pregnancies, second trimester: Secondary | ICD-10-CM

## 2015-02-28 DIAGNOSIS — Z331 Pregnant state, incidental: Secondary | ICD-10-CM

## 2015-02-28 DIAGNOSIS — Z3492 Encounter for supervision of normal pregnancy, unspecified, second trimester: Secondary | ICD-10-CM

## 2015-02-28 DIAGNOSIS — O09212 Supervision of pregnancy with history of pre-term labor, second trimester: Secondary | ICD-10-CM | POA: Diagnosis not present

## 2015-02-28 LAB — POCT URINALYSIS DIPSTICK
GLUCOSE UA: NEGATIVE
Ketones, UA: NEGATIVE
LEUKOCYTES UA: NEGATIVE
NITRITE UA: NEGATIVE
Protein, UA: NEGATIVE

## 2015-02-28 MED ORDER — HYDROXYPROGESTERONE CAPROATE 250 MG/ML IM OIL
250.0000 mg | TOPICAL_OIL | Freq: Once | INTRAMUSCULAR | Status: AC
  Administered 2015-02-28: 250 mg via INTRAMUSCULAR

## 2015-02-28 NOTE — Progress Notes (Signed)
Pt here for 17P. Reports no problems at this time. Return in 1 week for next shot. [place]

## 2015-03-04 LAB — INFORMASEQ(SM) WITH XY ANALYSIS
FETAL FRACTION (%): 9.7
FETAL NUMBER: 1
Gestational Age at Collection: 17.3 weeks
Weight: 173 [lb_av]

## 2015-03-07 ENCOUNTER — Ambulatory Visit (INDEPENDENT_AMBULATORY_CARE_PROVIDER_SITE_OTHER): Payer: Managed Care, Other (non HMO) | Admitting: *Deleted

## 2015-03-07 ENCOUNTER — Encounter: Payer: Self-pay | Admitting: *Deleted

## 2015-03-07 VITALS — BP 124/72 | HR 76 | Ht 62.0 in | Wt 172.5 lb

## 2015-03-07 DIAGNOSIS — Z3492 Encounter for supervision of normal pregnancy, unspecified, second trimester: Secondary | ICD-10-CM

## 2015-03-07 DIAGNOSIS — O09892 Supervision of other high risk pregnancies, second trimester: Secondary | ICD-10-CM

## 2015-03-07 DIAGNOSIS — O09212 Supervision of pregnancy with history of pre-term labor, second trimester: Secondary | ICD-10-CM

## 2015-03-07 DIAGNOSIS — O289 Unspecified abnormal findings on antenatal screening of mother: Secondary | ICD-10-CM

## 2015-03-07 DIAGNOSIS — Z331 Pregnant state, incidental: Secondary | ICD-10-CM

## 2015-03-07 DIAGNOSIS — Z8751 Personal history of pre-term labor: Secondary | ICD-10-CM

## 2015-03-07 DIAGNOSIS — Z1389 Encounter for screening for other disorder: Secondary | ICD-10-CM

## 2015-03-07 LAB — POCT URINALYSIS DIPSTICK
Glucose, UA: NEGATIVE
KETONES UA: NEGATIVE
Leukocytes, UA: NEGATIVE
NITRITE UA: NEGATIVE
Protein, UA: NEGATIVE
RBC UA: NEGATIVE

## 2015-03-07 MED ORDER — HYDROXYPROGESTERONE CAPROATE 250 MG/ML IM OIL
250.0000 mg | TOPICAL_OIL | Freq: Once | INTRAMUSCULAR | Status: AC
  Administered 2015-03-07: 250 mg via INTRAMUSCULAR

## 2015-03-07 NOTE — Progress Notes (Signed)
Pt here for 17P. Reports having dizziness. Has spoke to [place], [place] about this and she was advised to lay down when this happens and that is helping fine. Return in 1 week for next shot. [place]

## 2015-03-13 ENCOUNTER — Other Ambulatory Visit: Payer: Self-pay | Admitting: Advanced Practice Midwife

## 2015-03-13 DIAGNOSIS — Z363 Encounter for antenatal screening for malformations: Secondary | ICD-10-CM

## 2015-03-13 DIAGNOSIS — O281 Abnormal biochemical finding on antenatal screening of mother: Secondary | ICD-10-CM

## 2015-03-13 DIAGNOSIS — IMO0002 Reserved for concepts with insufficient information to code with codable children: Secondary | ICD-10-CM

## 2015-03-13 DIAGNOSIS — Z0489 Encounter for examination and observation for other specified reasons: Secondary | ICD-10-CM

## 2015-03-14 ENCOUNTER — Encounter: Payer: Self-pay | Admitting: *Deleted

## 2015-03-14 ENCOUNTER — Ambulatory Visit (INDEPENDENT_AMBULATORY_CARE_PROVIDER_SITE_OTHER): Payer: Managed Care, Other (non HMO) | Admitting: *Deleted

## 2015-03-14 ENCOUNTER — Ambulatory Visit: Payer: Managed Care, Other (non HMO)

## 2015-03-14 VITALS — BP 110/60 | HR 92 | Ht 62.0 in | Wt 173.0 lb

## 2015-03-14 DIAGNOSIS — Z8751 Personal history of pre-term labor: Secondary | ICD-10-CM

## 2015-03-14 DIAGNOSIS — Z3492 Encounter for supervision of normal pregnancy, unspecified, second trimester: Secondary | ICD-10-CM

## 2015-03-14 DIAGNOSIS — O09892 Supervision of other high risk pregnancies, second trimester: Secondary | ICD-10-CM

## 2015-03-14 DIAGNOSIS — Z1389 Encounter for screening for other disorder: Secondary | ICD-10-CM

## 2015-03-14 DIAGNOSIS — O289 Unspecified abnormal findings on antenatal screening of mother: Secondary | ICD-10-CM

## 2015-03-14 DIAGNOSIS — O09212 Supervision of pregnancy with history of pre-term labor, second trimester: Secondary | ICD-10-CM | POA: Diagnosis not present

## 2015-03-14 DIAGNOSIS — Z331 Pregnant state, incidental: Secondary | ICD-10-CM

## 2015-03-14 LAB — POCT URINALYSIS DIPSTICK
Blood, UA: NEGATIVE
GLUCOSE UA: NEGATIVE
Ketones, UA: NEGATIVE
Nitrite, UA: NEGATIVE
Protein, UA: NEGATIVE

## 2015-03-14 MED ORDER — HYDROXYPROGESTERONE CAPROATE 250 MG/ML IM OIL
250.0000 mg | TOPICAL_OIL | Freq: Once | INTRAMUSCULAR | Status: AC
  Administered 2015-03-14: 250 mg via INTRAMUSCULAR

## 2015-03-14 NOTE — Progress Notes (Signed)
Pt here for 17P. Concerned that she's not gaining weight. Pt has a scheduled appt to see provider Monday. Advised to discuss with provider. Pt having trouble with BM's. Advised to eat 4 prunes daily or drink prune juice. Pt voiced understanding.  Pt drinks a lot of water during the day. Return in 1 week for next shot. [place]

## 2015-03-18 ENCOUNTER — Other Ambulatory Visit: Payer: Self-pay | Admitting: *Deleted

## 2015-03-18 ENCOUNTER — Ambulatory Visit (INDEPENDENT_AMBULATORY_CARE_PROVIDER_SITE_OTHER): Payer: Managed Care, Other (non HMO)

## 2015-03-18 ENCOUNTER — Ambulatory Visit (INDEPENDENT_AMBULATORY_CARE_PROVIDER_SITE_OTHER): Payer: Managed Care, Other (non HMO) | Admitting: Women's Health

## 2015-03-18 VITALS — BP 102/62 | HR 53 | Wt 170.0 lb

## 2015-03-18 DIAGNOSIS — O34219 Maternal care for unspecified type scar from previous cesarean delivery: Secondary | ICD-10-CM

## 2015-03-18 DIAGNOSIS — O09213 Supervision of pregnancy with history of pre-term labor, third trimester: Secondary | ICD-10-CM | POA: Diagnosis not present

## 2015-03-18 DIAGNOSIS — Z1389 Encounter for screening for other disorder: Secondary | ICD-10-CM

## 2015-03-18 DIAGNOSIS — IMO0002 Reserved for concepts with insufficient information to code with codable children: Secondary | ICD-10-CM

## 2015-03-18 DIAGNOSIS — Z363 Encounter for antenatal screening for malformations: Secondary | ICD-10-CM

## 2015-03-18 DIAGNOSIS — Z3A20 20 weeks gestation of pregnancy: Secondary | ICD-10-CM

## 2015-03-18 DIAGNOSIS — O98219 Gonorrhea complicating pregnancy, unspecified trimester: Secondary | ICD-10-CM

## 2015-03-18 DIAGNOSIS — O3421 Maternal care for scar from previous cesarean delivery: Secondary | ICD-10-CM | POA: Diagnosis not present

## 2015-03-18 DIAGNOSIS — Z36 Encounter for antenatal screening of mother: Secondary | ICD-10-CM | POA: Diagnosis not present

## 2015-03-18 DIAGNOSIS — Z0489 Encounter for examination and observation for other specified reasons: Secondary | ICD-10-CM

## 2015-03-18 DIAGNOSIS — Z3492 Encounter for supervision of normal pregnancy, unspecified, second trimester: Secondary | ICD-10-CM | POA: Diagnosis not present

## 2015-03-18 DIAGNOSIS — Z8751 Personal history of pre-term labor: Secondary | ICD-10-CM

## 2015-03-18 DIAGNOSIS — O281 Abnormal biochemical finding on antenatal screening of mother: Secondary | ICD-10-CM | POA: Diagnosis not present

## 2015-03-18 DIAGNOSIS — Z331 Pregnant state, incidental: Secondary | ICD-10-CM

## 2015-03-18 DIAGNOSIS — O289 Unspecified abnormal findings on antenatal screening of mother: Secondary | ICD-10-CM

## 2015-03-18 LAB — POCT URINALYSIS DIPSTICK
Blood, UA: NEGATIVE
GLUCOSE UA: NEGATIVE
Ketones, UA: NEGATIVE
LEUKOCYTES UA: NEGATIVE
Nitrite, UA: NEGATIVE
PROTEIN UA: NEGATIVE

## 2015-03-18 NOTE — Addendum Note (Signed)
Addended by: Moreen Fowler R on: 03/18/2015 01:25 PM   Modules accepted: Orders

## 2015-03-18 NOTE — Progress Notes (Addendum)
[place] 20WKS,measurements c/w dates, thick post pl 4.6cm,sdp of fluid 4.3cm, cx (tv)4.3cm,lt ov cl cyst 3.5 x 2.8 x 3.2 cm,breech,efw 299g,fht 144bpm,anatomy complete,2.4 x 2.4 x 1.2 cm fibroid ant

## 2015-03-18 NOTE — Progress Notes (Signed)
Low-risk OB appointment [id number] [redacted]w[redacted]d Estimated Date of Delivery: 08/05/15 BP 102/62 mmHg  Pulse 53  Wt 170 lb (77.111 kg)  LMP 11/26/2014  BP, weight, and urine reviewed.  Refer to obstetrical flow sheet for FH & FHR.  Reports good fm.  Denies regular uc's, lof, vb, or uti s/s. Constipation- gave printed relief info. Will do GC poc today. Thinks fob has had [place] testing before and was neg- will check w/ him and let us know. Doesn't want VBAC, wants RLTCS. NIPs testing was neg and anatomy u/s today normal female.  Reviewed today's normal anatomy u/s, ptl s/s, fm. Plan:  Continue routine obstetrical care  F/U in 4wks for OB appointment, continue weekly [facility] (got last thurs, so too early to get today, is scheduled to come back thurs)

## 2015-03-18 NOTE — Patient Instructions (Signed)
Constipation  Drink plenty of fluid, preferably water, throughout the day  Eat foods high in fiber such as fruits, vegetables, and grains  Exercise, such as walking, is a good way to keep your bowels regular  Drink warm fluids, especially warm prune juice, or decaf coffee  Eat a 1/2 cup of real oatmeal (not instant), 1/2 cup applesauce, and 1/2-1 cup warm prune juice every day  If needed, you may take Colace (docusate sodium) stool softener once or twice a day to help keep the stool soft. If you are pregnant, wait until you are out of your first trimester (12-14 weeks of pregnancy)  If you still are having problems with constipation, you may take Miralax once daily as needed to help keep your bowels regular.  If you are pregnant, wait until you are out of your first trimester (12-14 weeks of pregnancy)   Second Trimester of Pregnancy The second trimester is from week 13 through week 28, months 4 through 6. The second trimester is often a time when you feel your best. Your body has also adjusted to being pregnant, and you begin to feel better physically. Usually, morning sickness has lessened or quit completely, you may have more energy, and you may have an increase in appetite. The second trimester is also a time when the fetus is growing rapidly. At the end of the sixth month, the fetus is about 9 inches long and weighs about 1 pounds. You will likely begin to feel the baby move (quickening) between 18 and 20 weeks of the pregnancy. BODY CHANGES Your body goes through many changes during pregnancy. The changes vary from woman to woman.   Your weight will continue to increase. You will notice your lower abdomen bulging out.  You may begin to get stretch marks on your hips, abdomen, and breasts.  You may develop headaches that can be relieved by medicines approved by your health care provider.  You may urinate more often because the fetus is pressing on your bladder.  You may develop or  continue to have heartburn as a result of your pregnancy.  You may develop constipation because certain hormones are causing the muscles that push waste through your intestines to slow down.  You may develop hemorrhoids or swollen, bulging veins (varicose veins).  You may have back pain because of the weight gain and pregnancy hormones relaxing your joints between the bones in your pelvis and as a result of a shift in weight and the muscles that support your balance.  Your breasts will continue to grow and be tender.  Your gums may bleed and may be sensitive to brushing and flossing.  Dark spots or blotches (chloasma, mask of pregnancy) may develop on your face. This will likely fade after the baby is born.  A dark line from your belly button to the pubic area (linea nigra) may appear. This will likely fade after the baby is born.  You may have changes in your hair. These can include thickening of your hair, rapid growth, and changes in texture. Some women also have hair loss during or after pregnancy, or hair that feels dry or thin. Your hair will most likely return to normal after your baby is born. WHAT TO EXPECT AT YOUR PRENATAL VISITS During a routine prenatal visit:  You will be weighed to make sure you and the fetus are growing normally.  Your blood pressure will be taken.  Your abdomen will be measured to track your baby's growth.  The fetal heartbeat will be listened to.  Any test results from the previous visit will be discussed. Your health care provider may ask you:  How you are feeling.  If you are feeling the baby move.  If you have had any abnormal symptoms, such as leaking fluid, bleeding, severe headaches, or abdominal cramping.  If you have any questions. Other tests that may be performed during your second trimester include:  Blood tests that check for:  Low iron levels (anemia).  Gestational diabetes (between 24 and 28 weeks).  Rh antibodies.  Urine  tests to check for infections, diabetes, or protein in the urine.  An ultrasound to confirm the proper growth and development of the baby.  An amniocentesis to check for possible genetic problems.  Fetal screens for spina bifida and Down syndrome. HOME CARE INSTRUCTIONS   Avoid all smoking, herbs, alcohol, and unprescribed drugs. These chemicals affect the formation and growth of the baby.  Follow your health care provider's instructions regarding medicine use. There are medicines that are either safe or unsafe to take during pregnancy.  Exercise only as directed by your health care provider. Experiencing uterine cramps is a good sign to stop exercising.  Continue to eat regular, healthy meals.  Wear a good support bra for breast tenderness.  Do not use hot tubs, steam rooms, or saunas.  Wear your seat belt at all times when driving.  Avoid raw meat, uncooked cheese, cat litter boxes, and soil used by cats. These carry germs that can cause birth defects in the baby.  Take your prenatal vitamins.  Try taking a stool softener (if your health care provider approves) if you develop constipation. Eat more high-fiber foods, such as fresh vegetables or fruit and whole grains. Drink plenty of fluids to keep your urine clear or pale yellow.  Take warm sitz baths to soothe any pain or discomfort caused by hemorrhoids. Use hemorrhoid cream if your health care provider approves.  If you develop varicose veins, wear support hose. Elevate your feet for 15 minutes, 3-4 times a day. Limit salt in your diet.  Avoid heavy lifting, wear low heel shoes, and practice good posture.  Rest with your legs elevated if you have leg cramps or low back pain.  Visit your dentist if you have not gone yet during your pregnancy. Use a soft toothbrush to brush your teeth and be gentle when you floss.  A sexual relationship may be continued unless your health care provider directs you otherwise.  Continue to  go to all your prenatal visits as directed by your health care provider. SEEK MEDICAL CARE IF:   You have dizziness.  You have mild pelvic cramps, pelvic pressure, or nagging pain in the abdominal area.  You have persistent nausea, vomiting, or diarrhea.  You have a bad smelling vaginal discharge.  You have pain with urination. SEEK IMMEDIATE MEDICAL CARE IF:   You have a fever.  You are leaking fluid from your vagina.  You have spotting or bleeding from your vagina.  You have severe abdominal cramping or pain.  You have rapid weight gain or loss.  You have shortness of breath with chest pain.  You notice sudden or extreme swelling of your face, hands, ankles, feet, or legs.  You have not felt your baby move in over an hour.  You have severe headaches that do not go away with medicine.  You have vision changes. Document Released: 09/22/2001 Document Revised: 10/03/2013 Document Reviewed: 11/29/2012 Exit[employer] Patient  Information 2015 [employer]. This information is not intended to replace advice given to you by your health care provider. Make sure you discuss any questions you have with your health care provider.  

## 2015-03-19 LAB — GC/CHLAMYDIA PROBE AMP
Chlamydia trachomatis, NAA: NEGATIVE
Neisseria gonorrhoeae by PCR: NEGATIVE

## 2015-03-20 ENCOUNTER — Ambulatory Visit: Payer: Managed Care, Other (non HMO)

## 2015-03-20 ENCOUNTER — Encounter: Payer: Self-pay | Admitting: *Deleted

## 2015-03-20 ENCOUNTER — Ambulatory Visit (INDEPENDENT_AMBULATORY_CARE_PROVIDER_SITE_OTHER): Payer: Managed Care, Other (non HMO) | Admitting: *Deleted

## 2015-03-20 VITALS — BP 130/62 | HR 84 | Ht 62.0 in | Wt 172.0 lb

## 2015-03-20 DIAGNOSIS — O09892 Supervision of other high risk pregnancies, second trimester: Secondary | ICD-10-CM

## 2015-03-20 DIAGNOSIS — O289 Unspecified abnormal findings on antenatal screening of mother: Secondary | ICD-10-CM

## 2015-03-20 DIAGNOSIS — O09212 Supervision of pregnancy with history of pre-term labor, second trimester: Secondary | ICD-10-CM

## 2015-03-20 DIAGNOSIS — Z331 Pregnant state, incidental: Secondary | ICD-10-CM

## 2015-03-20 DIAGNOSIS — Z1389 Encounter for screening for other disorder: Secondary | ICD-10-CM

## 2015-03-20 DIAGNOSIS — Z3492 Encounter for supervision of normal pregnancy, unspecified, second trimester: Secondary | ICD-10-CM

## 2015-03-20 DIAGNOSIS — Z8751 Personal history of pre-term labor: Secondary | ICD-10-CM

## 2015-03-20 LAB — POCT URINALYSIS DIPSTICK
GLUCOSE UA: NEGATIVE
KETONES UA: NEGATIVE
Leukocytes, UA: NEGATIVE
NITRITE UA: NEGATIVE
PROTEIN UA: NEGATIVE

## 2015-03-20 MED ORDER — HYDROXYPROGESTERONE CAPROATE 250 MG/ML IM OIL
250.0000 mg | TOPICAL_OIL | Freq: Once | INTRAMUSCULAR | Status: AC
  Administered 2015-03-20: 250 mg via INTRAMUSCULAR

## 2015-03-20 NOTE — Progress Notes (Signed)
Pt here for 17P. Reports no problems at this time. Return in 1 week for next shot. [place]

## 2015-03-27 ENCOUNTER — Ambulatory Visit (INDEPENDENT_AMBULATORY_CARE_PROVIDER_SITE_OTHER): Payer: Managed Care, Other (non HMO) | Admitting: *Deleted

## 2015-03-27 ENCOUNTER — Ambulatory Visit: Payer: Managed Care, Other (non HMO)

## 2015-03-27 ENCOUNTER — Encounter: Payer: Self-pay | Admitting: *Deleted

## 2015-03-27 VITALS — BP 130/78 | HR 64 | Ht 62.0 in | Wt 168.5 lb

## 2015-03-27 DIAGNOSIS — Z1389 Encounter for screening for other disorder: Secondary | ICD-10-CM

## 2015-03-27 DIAGNOSIS — Z331 Pregnant state, incidental: Secondary | ICD-10-CM

## 2015-03-27 DIAGNOSIS — Z3492 Encounter for supervision of normal pregnancy, unspecified, second trimester: Secondary | ICD-10-CM

## 2015-03-27 DIAGNOSIS — O09212 Supervision of pregnancy with history of pre-term labor, second trimester: Secondary | ICD-10-CM | POA: Diagnosis not present

## 2015-03-27 DIAGNOSIS — O289 Unspecified abnormal findings on antenatal screening of mother: Secondary | ICD-10-CM

## 2015-03-27 DIAGNOSIS — Z8751 Personal history of pre-term labor: Secondary | ICD-10-CM

## 2015-03-27 DIAGNOSIS — O09892 Supervision of other high risk pregnancies, second trimester: Secondary | ICD-10-CM

## 2015-03-27 LAB — POCT URINALYSIS DIPSTICK
GLUCOSE UA: NEGATIVE
NITRITE UA: NEGATIVE
Protein, UA: NEGATIVE
RBC UA: NEGATIVE

## 2015-03-27 MED ORDER — HYDROXYPROGESTERONE CAPROATE 250 MG/ML IM OIL
250.0000 mg | TOPICAL_OIL | Freq: Once | INTRAMUSCULAR | Status: AC
  Administered 2015-03-27: 250 mg via INTRAMUSCULAR

## 2015-03-27 NOTE — Progress Notes (Signed)
Pt here for 17P. Reports no problems at this time. Return in 1 week for next shot. [place]

## 2015-04-03 ENCOUNTER — Ambulatory Visit: Payer: Managed Care, Other (non HMO)

## 2015-04-03 ENCOUNTER — Ambulatory Visit (INDEPENDENT_AMBULATORY_CARE_PROVIDER_SITE_OTHER): Payer: Managed Care, Other (non HMO) | Admitting: *Deleted

## 2015-04-03 ENCOUNTER — Encounter: Payer: Self-pay | Admitting: *Deleted

## 2015-04-03 VITALS — BP 118/72 | HR 100 | Ht 62.0 in | Wt 170.5 lb

## 2015-04-03 DIAGNOSIS — O09892 Supervision of other high risk pregnancies, second trimester: Secondary | ICD-10-CM

## 2015-04-03 DIAGNOSIS — Z8751 Personal history of pre-term labor: Secondary | ICD-10-CM

## 2015-04-03 DIAGNOSIS — Z331 Pregnant state, incidental: Secondary | ICD-10-CM

## 2015-04-03 DIAGNOSIS — Z1389 Encounter for screening for other disorder: Secondary | ICD-10-CM

## 2015-04-03 DIAGNOSIS — O09212 Supervision of pregnancy with history of pre-term labor, second trimester: Secondary | ICD-10-CM

## 2015-04-03 DIAGNOSIS — O289 Unspecified abnormal findings on antenatal screening of mother: Secondary | ICD-10-CM

## 2015-04-03 DIAGNOSIS — Z3492 Encounter for supervision of normal pregnancy, unspecified, second trimester: Secondary | ICD-10-CM

## 2015-04-03 LAB — POCT URINALYSIS DIPSTICK
Blood, UA: NEGATIVE
Glucose, UA: NEGATIVE
Ketones, UA: NEGATIVE
Leukocytes, UA: NEGATIVE
Nitrite, UA: NEGATIVE
Protein, UA: NEGATIVE

## 2015-04-03 MED ORDER — HYDROXYPROGESTERONE CAPROATE 250 MG/ML IM OIL
250.0000 mg | TOPICAL_OIL | Freq: Once | INTRAMUSCULAR | Status: AC
  Administered 2015-04-03: 250 mg via INTRAMUSCULAR

## 2015-04-03 NOTE — Progress Notes (Signed)
Pt here for 17P. Reports being thirsty all the time. Pt had a sugar test [facility] the health dept in May and it was normal. Advised to discuss with provider at next appt and to keep herself well hydrated. Pt voiced understanding. Return in 1 week for next shot. [place]

## 2015-04-10 ENCOUNTER — Ambulatory Visit: Payer: Managed Care, Other (non HMO)

## 2015-04-10 ENCOUNTER — Encounter: Payer: Self-pay | Admitting: *Deleted

## 2015-04-10 ENCOUNTER — Ambulatory Visit (INDEPENDENT_AMBULATORY_CARE_PROVIDER_SITE_OTHER): Payer: Managed Care, Other (non HMO) | Admitting: *Deleted

## 2015-04-10 VITALS — BP 116/72 | HR 88 | Ht 62.0 in | Wt 170.5 lb

## 2015-04-10 DIAGNOSIS — O09212 Supervision of pregnancy with history of pre-term labor, second trimester: Secondary | ICD-10-CM

## 2015-04-10 DIAGNOSIS — Z8751 Personal history of pre-term labor: Secondary | ICD-10-CM

## 2015-04-10 DIAGNOSIS — O289 Unspecified abnormal findings on antenatal screening of mother: Secondary | ICD-10-CM

## 2015-04-10 DIAGNOSIS — Z331 Pregnant state, incidental: Secondary | ICD-10-CM

## 2015-04-10 DIAGNOSIS — Z1389 Encounter for screening for other disorder: Secondary | ICD-10-CM

## 2015-04-10 DIAGNOSIS — Z3492 Encounter for supervision of normal pregnancy, unspecified, second trimester: Secondary | ICD-10-CM

## 2015-04-10 DIAGNOSIS — O09892 Supervision of other high risk pregnancies, second trimester: Secondary | ICD-10-CM

## 2015-04-10 LAB — POCT URINALYSIS DIPSTICK
Blood, UA: NEGATIVE
Glucose, UA: NEGATIVE
Ketones, UA: NEGATIVE
Leukocytes, UA: NEGATIVE
Nitrite, UA: NEGATIVE
Protein, UA: NEGATIVE

## 2015-04-10 MED ORDER — HYDROXYPROGESTERONE CAPROATE 250 MG/ML IM OIL
250.0000 mg | TOPICAL_OIL | Freq: Once | INTRAMUSCULAR | Status: AC
  Administered 2015-04-10: 250 mg via INTRAMUSCULAR

## 2015-04-10 NOTE — Progress Notes (Signed)
Pt here for 17P. Reports still staying thirsty. Pt drinking plenty of fluids. Has a scheduled appt with Dr. Elonda Husky on 04/16/15. Pt states chest hurts when she coughs x 2 days. I spoke with Kim,CNM and she advised to try Robitussin or cough drops. Pt voiced understanding. Return at scheduled appt. [place]

## 2015-04-16 ENCOUNTER — Encounter: Payer: Self-pay | Admitting: Obstetrics & Gynecology

## 2015-04-16 ENCOUNTER — Ambulatory Visit (INDEPENDENT_AMBULATORY_CARE_PROVIDER_SITE_OTHER): Payer: Medicaid Other | Admitting: Obstetrics & Gynecology

## 2015-04-16 VITALS — BP 112/76 | HR 92 | Wt 171.0 lb

## 2015-04-16 DIAGNOSIS — O09212 Supervision of pregnancy with history of pre-term labor, second trimester: Secondary | ICD-10-CM | POA: Diagnosis not present

## 2015-04-16 DIAGNOSIS — O09892 Supervision of other high risk pregnancies, second trimester: Secondary | ICD-10-CM

## 2015-04-16 DIAGNOSIS — Z3492 Encounter for supervision of normal pregnancy, unspecified, second trimester: Secondary | ICD-10-CM

## 2015-04-16 MED ORDER — HYDROXYPROGESTERONE CAPROATE 250 MG/ML IM OIL
250.0000 mg | TOPICAL_OIL | Freq: Once | INTRAMUSCULAR | Status: AC
  Administered 2015-04-16: 250 mg via INTRAMUSCULAR

## 2015-04-16 NOTE — Progress Notes (Signed)
[id number] [redacted]w[redacted]d Estimated Date of Delivery: 08/05/15  Blood pressure 112/76, pulse 92, weight 171 lb (77.565 kg), last menstrual period 11/26/2014.   BP weight and urine results all reviewed and noted.  Please refer to the obstetrical flow sheet for the fundal height and fetal heart rate documentation:  Patient reports good fetal movement, denies any bleeding and no rupture of membranes symptoms or regular contractions. Patient is without complaints. All questions were answered.  Plan:  Continued routine obstetrical care,   Follow up in 4 weeks for OB appointment, PN2  Weekly 17P

## 2015-04-16 NOTE — Progress Notes (Signed)
Pt denies any problems or concerns at this time.  

## 2015-04-23 ENCOUNTER — Encounter: Payer: Self-pay | Admitting: *Deleted

## 2015-04-23 ENCOUNTER — Ambulatory Visit (INDEPENDENT_AMBULATORY_CARE_PROVIDER_SITE_OTHER): Payer: Medicaid Other | Admitting: *Deleted

## 2015-04-23 VITALS — BP 128/78 | HR 80 | Ht 62.0 in | Wt 174.0 lb

## 2015-04-23 DIAGNOSIS — O289 Unspecified abnormal findings on antenatal screening of mother: Secondary | ICD-10-CM

## 2015-04-23 DIAGNOSIS — O09892 Supervision of other high risk pregnancies, second trimester: Secondary | ICD-10-CM

## 2015-04-23 DIAGNOSIS — O09212 Supervision of pregnancy with history of pre-term labor, second trimester: Secondary | ICD-10-CM | POA: Diagnosis not present

## 2015-04-23 DIAGNOSIS — Z1389 Encounter for screening for other disorder: Secondary | ICD-10-CM

## 2015-04-23 DIAGNOSIS — Z3492 Encounter for supervision of normal pregnancy, unspecified, second trimester: Secondary | ICD-10-CM

## 2015-04-23 DIAGNOSIS — Z8751 Personal history of pre-term labor: Secondary | ICD-10-CM

## 2015-04-23 DIAGNOSIS — Z331 Pregnant state, incidental: Secondary | ICD-10-CM

## 2015-04-23 LAB — POCT URINALYSIS DIPSTICK
GLUCOSE UA: NEGATIVE
Ketones, UA: NEGATIVE
Nitrite, UA: NEGATIVE
Protein, UA: NEGATIVE

## 2015-04-23 MED ORDER — HYDROXYPROGESTERONE CAPROATE 250 MG/ML IM OIL
250.0000 mg | TOPICAL_OIL | Freq: Once | INTRAMUSCULAR | Status: AC
  Administered 2015-04-23: 250 mg via INTRAMUSCULAR

## 2015-04-23 NOTE — Progress Notes (Signed)
Pt here for 17P. Reports having reflux yesterday, not as bad today. Pt has Omeprazole, advised to take. Pt voiced understanding. Return in 1 week for next shot. [place]

## 2015-04-30 ENCOUNTER — Ambulatory Visit (INDEPENDENT_AMBULATORY_CARE_PROVIDER_SITE_OTHER): Payer: Medicaid Other | Admitting: *Deleted

## 2015-04-30 ENCOUNTER — Ambulatory Visit: Payer: Medicaid Other

## 2015-04-30 ENCOUNTER — Encounter: Payer: Self-pay | Admitting: *Deleted

## 2015-04-30 VITALS — BP 116/80 | HR 76 | Ht 62.0 in | Wt 175.0 lb

## 2015-04-30 DIAGNOSIS — O289 Unspecified abnormal findings on antenatal screening of mother: Secondary | ICD-10-CM

## 2015-04-30 DIAGNOSIS — Z331 Pregnant state, incidental: Secondary | ICD-10-CM

## 2015-04-30 DIAGNOSIS — Z1389 Encounter for screening for other disorder: Secondary | ICD-10-CM

## 2015-04-30 DIAGNOSIS — Z3492 Encounter for supervision of normal pregnancy, unspecified, second trimester: Secondary | ICD-10-CM

## 2015-04-30 DIAGNOSIS — O09212 Supervision of pregnancy with history of pre-term labor, second trimester: Secondary | ICD-10-CM

## 2015-04-30 DIAGNOSIS — Z8751 Personal history of pre-term labor: Secondary | ICD-10-CM

## 2015-04-30 DIAGNOSIS — O09892 Supervision of other high risk pregnancies, second trimester: Secondary | ICD-10-CM

## 2015-04-30 LAB — POCT URINALYSIS DIPSTICK
Blood, UA: NEGATIVE
Glucose, UA: NEGATIVE
KETONES UA: NEGATIVE
LEUKOCYTES UA: NEGATIVE
Nitrite, UA: NEGATIVE
Protein, UA: NEGATIVE

## 2015-04-30 MED ORDER — HYDROXYPROGESTERONE CAPROATE 250 MG/ML IM OIL
250.0000 mg | TOPICAL_OIL | Freq: Once | INTRAMUSCULAR | Status: AC
  Administered 2015-04-30: 250 mg via INTRAMUSCULAR

## 2015-04-30 NOTE — Progress Notes (Signed)
Pt here for 17P. Reports no problems at this time. Return in 1 week for next shot. [place]

## 2015-05-07 ENCOUNTER — Ambulatory Visit (INDEPENDENT_AMBULATORY_CARE_PROVIDER_SITE_OTHER): Payer: Medicaid Other | Admitting: *Deleted

## 2015-05-07 ENCOUNTER — Encounter: Payer: Self-pay | Admitting: *Deleted

## 2015-05-07 VITALS — BP 120/80 | HR 84 | Ht 62.0 in | Wt 174.0 lb

## 2015-05-07 DIAGNOSIS — Z1389 Encounter for screening for other disorder: Secondary | ICD-10-CM

## 2015-05-07 DIAGNOSIS — O09212 Supervision of pregnancy with history of pre-term labor, second trimester: Secondary | ICD-10-CM | POA: Diagnosis not present

## 2015-05-07 DIAGNOSIS — O09892 Supervision of other high risk pregnancies, second trimester: Secondary | ICD-10-CM

## 2015-05-07 DIAGNOSIS — Z331 Pregnant state, incidental: Secondary | ICD-10-CM

## 2015-05-07 LAB — POCT URINALYSIS DIPSTICK
Blood, UA: NEGATIVE
Glucose, UA: NEGATIVE
Ketones, UA: NEGATIVE
Leukocytes, UA: NEGATIVE
Nitrite, UA: NEGATIVE
Protein, UA: NEGATIVE

## 2015-05-07 MED ORDER — HYDROXYPROGESTERONE CAPROATE 250 MG/ML IM OIL
250.0000 mg | TOPICAL_OIL | Freq: Once | INTRAMUSCULAR | Status: AC
  Administered 2015-05-07: 250 mg via INTRAMUSCULAR

## 2015-05-07 NOTE — Progress Notes (Signed)
Patient ID: Sarah Cross, female   DOB: 03-Jun-1981, 34 y.o.   MRN: [id number] Pt here today for 17P injection. Pt states that she has noticed swelling in her feet and a white discharge. Pt was advised to push her fluids and watch her salt intake and to watch for any changes in her discharge. Pt denied any itching or odor. Pt was also advised to call our office if any changes occur. Pt verbalized understanding. Pt was given 17 P injection and tolerated well.

## 2015-05-15 ENCOUNTER — Other Ambulatory Visit: Payer: Medicaid Other

## 2015-05-15 ENCOUNTER — Encounter: Payer: Self-pay | Admitting: Advanced Practice Midwife

## 2015-05-15 ENCOUNTER — Ambulatory Visit (INDEPENDENT_AMBULATORY_CARE_PROVIDER_SITE_OTHER): Payer: Medicaid Other | Admitting: Advanced Practice Midwife

## 2015-05-15 ENCOUNTER — Other Ambulatory Visit: Payer: Self-pay | Admitting: Advanced Practice Midwife

## 2015-05-15 VITALS — BP 100/60 | HR 80 | Wt 173.0 lb

## 2015-05-15 DIAGNOSIS — O09523 Supervision of elderly multigravida, third trimester: Secondary | ICD-10-CM

## 2015-05-15 DIAGNOSIS — Z331 Pregnant state, incidental: Secondary | ICD-10-CM

## 2015-05-15 DIAGNOSIS — Z3493 Encounter for supervision of normal pregnancy, unspecified, third trimester: Secondary | ICD-10-CM

## 2015-05-15 DIAGNOSIS — Z1389 Encounter for screening for other disorder: Secondary | ICD-10-CM

## 2015-05-15 DIAGNOSIS — Z131 Encounter for screening for diabetes mellitus: Secondary | ICD-10-CM

## 2015-05-15 DIAGNOSIS — D573 Sickle-cell trait: Secondary | ICD-10-CM

## 2015-05-15 DIAGNOSIS — Z369 Encounter for antenatal screening, unspecified: Secondary | ICD-10-CM

## 2015-05-15 LAB — POCT URINALYSIS DIPSTICK
Blood, UA: NEGATIVE
Glucose, UA: NEGATIVE
KETONES UA: NEGATIVE
LEUKOCYTES UA: NEGATIVE
NITRITE UA: NEGATIVE
Protein, UA: NEGATIVE

## 2015-05-15 NOTE — Progress Notes (Signed)
[id number] [redacted]w[redacted]d Estimated Date of Delivery: 08/05/15  Weight 173 lb (78.472 kg), last menstrual period 11/26/2014.   BP weight and urine results all reviewed and noted.  Please refer to the obstetrical flow sheet for the fundal height and fetal heart rate documentation:  Patient reports good fetal movement, denies any bleeding and no rupture of membranes symptoms or regular contractions. Patient is without complaints. All questions were answered.  Plan:  Continued routine obstetrical care, PN2 today, urine cx today ([place] trait).  17p in left deltoid  Follow up in 4 weeks for OB appointment, EFW/AFI/Dopplers d/t AMA  Weekly 17p

## 2015-05-16 ENCOUNTER — Encounter: Payer: Self-pay | Admitting: Advanced Practice Midwife

## 2015-05-16 LAB — GLUCOSE TOLERANCE, 2 HOURS W/ 1HR
GLUCOSE, 1 HOUR: 139 mg/dL (ref 65–179)
Glucose, 2 hour: 92 mg/dL (ref 65–152)
Glucose, Fasting: 82 mg/dL (ref 65–91)

## 2015-05-16 LAB — RPR: RPR Ser Ql: NONREACTIVE

## 2015-05-16 LAB — HSV 2 ANTIBODY, IGG: HSV 2 GLYCOPROTEIN G AB, IGG: 2.08 {index} — AB (ref 0.00–0.90)

## 2015-05-17 LAB — URINE CULTURE

## 2015-05-22 ENCOUNTER — Encounter: Payer: Self-pay | Admitting: *Deleted

## 2015-05-22 ENCOUNTER — Ambulatory Visit (INDEPENDENT_AMBULATORY_CARE_PROVIDER_SITE_OTHER): Payer: Medicaid Other | Admitting: *Deleted

## 2015-05-22 VITALS — BP 100/70 | HR 76 | Ht 62.0 in | Wt 173.0 lb

## 2015-05-22 DIAGNOSIS — O09213 Supervision of pregnancy with history of pre-term labor, third trimester: Secondary | ICD-10-CM | POA: Diagnosis not present

## 2015-05-22 DIAGNOSIS — O289 Unspecified abnormal findings on antenatal screening of mother: Secondary | ICD-10-CM

## 2015-05-22 DIAGNOSIS — Z3493 Encounter for supervision of normal pregnancy, unspecified, third trimester: Secondary | ICD-10-CM

## 2015-05-22 DIAGNOSIS — Z8751 Personal history of pre-term labor: Secondary | ICD-10-CM

## 2015-05-22 DIAGNOSIS — Z331 Pregnant state, incidental: Secondary | ICD-10-CM

## 2015-05-22 DIAGNOSIS — O09893 Supervision of other high risk pregnancies, third trimester: Secondary | ICD-10-CM

## 2015-05-22 DIAGNOSIS — Z1389 Encounter for screening for other disorder: Secondary | ICD-10-CM

## 2015-05-22 LAB — POCT URINALYSIS DIPSTICK
Blood, UA: NEGATIVE
Glucose, UA: NEGATIVE
Ketones, UA: NEGATIVE
LEUKOCYTES UA: NEGATIVE
NITRITE UA: NEGATIVE
PROTEIN UA: NEGATIVE

## 2015-05-22 MED ORDER — HYDROXYPROGESTERONE CAPROATE 250 MG/ML IM OIL
250.0000 mg | TOPICAL_OIL | Freq: Once | INTRAMUSCULAR | Status: AC
  Administered 2015-05-22: 250 mg via INTRAMUSCULAR

## 2015-05-22 NOTE — Progress Notes (Signed)
Pt here for 17P. Pt reports having a vaginal discharge, white in color with no odor. I advised everything sounds ok at this time. Advised if she notices an odor, or if discharge changes color, let us know. Pt voiced understanding. Return in 1 week for next shot. Sarah Cross

## 2015-05-29 ENCOUNTER — Ambulatory Visit (INDEPENDENT_AMBULATORY_CARE_PROVIDER_SITE_OTHER): Payer: Medicaid Other | Admitting: *Deleted

## 2015-05-29 ENCOUNTER — Ambulatory Visit: Payer: Medicaid Other

## 2015-05-29 ENCOUNTER — Encounter: Payer: Self-pay | Admitting: *Deleted

## 2015-05-29 VITALS — BP 126/78 | HR 80 | Wt 175.0 lb

## 2015-05-29 DIAGNOSIS — Z331 Pregnant state, incidental: Secondary | ICD-10-CM

## 2015-05-29 DIAGNOSIS — O09213 Supervision of pregnancy with history of pre-term labor, third trimester: Secondary | ICD-10-CM

## 2015-05-29 DIAGNOSIS — Z1389 Encounter for screening for other disorder: Secondary | ICD-10-CM

## 2015-05-29 DIAGNOSIS — O09893 Supervision of other high risk pregnancies, third trimester: Secondary | ICD-10-CM

## 2015-05-29 MED ORDER — HYDROXYPROGESTERONE CAPROATE 250 MG/ML IM OIL
250.0000 mg | TOPICAL_OIL | Freq: Once | INTRAMUSCULAR | Status: AC
  Administered 2015-05-29: 250 mg via INTRAMUSCULAR

## 2015-05-29 NOTE — Progress Notes (Signed)
Patient ID: Sarah Cross, female   DOB: [birth year], 34 y.o.   MRN: [id number] Pt here for 17P injection, given in rt deltoid.  Pt tolerated well, instructed to follow up in 1 wk for next injection.

## 2015-06-05 ENCOUNTER — Ambulatory Visit: Payer: Medicaid Other

## 2015-06-05 ENCOUNTER — Ambulatory Visit (INDEPENDENT_AMBULATORY_CARE_PROVIDER_SITE_OTHER): Payer: Medicaid Other | Admitting: *Deleted

## 2015-06-05 ENCOUNTER — Encounter: Payer: Self-pay | Admitting: *Deleted

## 2015-06-05 VITALS — BP 122/72 | HR 80 | Ht 62.0 in | Wt 173.5 lb

## 2015-06-05 DIAGNOSIS — Z1389 Encounter for screening for other disorder: Secondary | ICD-10-CM

## 2015-06-05 DIAGNOSIS — Z8751 Personal history of pre-term labor: Secondary | ICD-10-CM

## 2015-06-05 DIAGNOSIS — O09893 Supervision of other high risk pregnancies, third trimester: Secondary | ICD-10-CM

## 2015-06-05 DIAGNOSIS — Z3493 Encounter for supervision of normal pregnancy, unspecified, third trimester: Secondary | ICD-10-CM

## 2015-06-05 DIAGNOSIS — O09213 Supervision of pregnancy with history of pre-term labor, third trimester: Secondary | ICD-10-CM | POA: Diagnosis not present

## 2015-06-05 DIAGNOSIS — Z331 Pregnant state, incidental: Secondary | ICD-10-CM

## 2015-06-05 DIAGNOSIS — O289 Unspecified abnormal findings on antenatal screening of mother: Secondary | ICD-10-CM

## 2015-06-05 LAB — POCT URINALYSIS DIPSTICK
Blood, UA: NEGATIVE
Glucose, UA: NEGATIVE
KETONES UA: NEGATIVE
LEUKOCYTES UA: NEGATIVE
Nitrite, UA: NEGATIVE
PROTEIN UA: NEGATIVE

## 2015-06-05 MED ORDER — HYDROXYPROGESTERONE CAPROATE 250 MG/ML IM OIL
250.0000 mg | TOPICAL_OIL | Freq: Once | INTRAMUSCULAR | Status: AC
  Administered 2015-06-05: 250 mg via INTRAMUSCULAR

## 2015-06-05 NOTE — Progress Notes (Signed)
Pt here for 17P. Pt reports no new concerns at this time. Return in 1 week for next shot. [place]

## 2015-06-12 ENCOUNTER — Ambulatory Visit (INDEPENDENT_AMBULATORY_CARE_PROVIDER_SITE_OTHER): Payer: Medicaid Other | Admitting: Advanced Practice Midwife

## 2015-06-12 ENCOUNTER — Ambulatory Visit (INDEPENDENT_AMBULATORY_CARE_PROVIDER_SITE_OTHER): Payer: Medicaid Other

## 2015-06-12 VITALS — BP 120/82 | HR 64 | Wt 173.0 lb

## 2015-06-12 DIAGNOSIS — O09213 Supervision of pregnancy with history of pre-term labor, third trimester: Secondary | ICD-10-CM

## 2015-06-12 DIAGNOSIS — Z1389 Encounter for screening for other disorder: Secondary | ICD-10-CM

## 2015-06-12 DIAGNOSIS — Z331 Pregnant state, incidental: Secondary | ICD-10-CM

## 2015-06-12 DIAGNOSIS — O09523 Supervision of elderly multigravida, third trimester: Secondary | ICD-10-CM

## 2015-06-12 DIAGNOSIS — Z8751 Personal history of pre-term labor: Secondary | ICD-10-CM

## 2015-06-12 DIAGNOSIS — O289 Unspecified abnormal findings on antenatal screening of mother: Secondary | ICD-10-CM

## 2015-06-12 DIAGNOSIS — Z3493 Encounter for supervision of normal pregnancy, unspecified, third trimester: Secondary | ICD-10-CM

## 2015-06-12 LAB — POCT URINALYSIS DIPSTICK
Glucose, UA: NEGATIVE
Ketones, UA: NEGATIVE
Leukocytes, UA: NEGATIVE
Nitrite, UA: NEGATIVE
Protein, UA: NEGATIVE
RBC UA: NEGATIVE

## 2015-06-12 MED ORDER — HYDROXYPROGESTERONE CAPROATE 250 MG/ML IM OIL
250.0000 mg | TOPICAL_OIL | Freq: Once | INTRAMUSCULAR | Status: AC
  Administered 2015-06-12: 250 mg via INTRAMUSCULAR

## 2015-06-12 NOTE — Progress Notes (Addendum)
[place] 32+2wks,efw 2549I 43.2%,cephalic,post pl gr 3,afi 14.8cm,RI .59,.63,bilat adnexa wnl,fht 143bpm,cx 4.8cm,BPP 8/8

## 2015-06-12 NOTE — Progress Notes (Signed)
[id number] [redacted]w[redacted]d Estimated Date of Delivery: 08/05/15  Blood pressure 120/82, pulse 64, weight 173 lb (78.472 kg), last menstrual period 11/26/2014.   BP weight and urine results all reviewed and noted.  Please refer to the obstetrical flow sheet for the fundal height and fetal heart rate documentation:[place] 32+2wks,efw 0973Z 43.2%,cephalic,post pl gr 3,afi 14.8cm,RI .59,.63,bilat adnexa wnl,fht 143bpm,cx 4.8cm,BPP 8/8  Patient reports good fetal movement, denies any bleeding and no rupture of membranes symptoms or regular contractions. Patient has "horrible constipation".  Has hard stools, hemorrhoids. Already drinking H20, fruits.  All questions were answered.  Orders Placed This Encounter  Procedures  . POCT urinalysis dipstick    Plan:  Continued routine obstetrical care, stool softners qhs.   Return weekly for 17p; LROB in 2 weeks.

## 2015-06-19 ENCOUNTER — Encounter: Payer: Self-pay | Admitting: *Deleted

## 2015-06-19 ENCOUNTER — Ambulatory Visit (INDEPENDENT_AMBULATORY_CARE_PROVIDER_SITE_OTHER): Payer: Medicaid Other | Admitting: *Deleted

## 2015-06-19 VITALS — BP 130/70 | HR 88 | Ht 62.0 in | Wt 174.0 lb

## 2015-06-19 DIAGNOSIS — Z1389 Encounter for screening for other disorder: Secondary | ICD-10-CM

## 2015-06-19 DIAGNOSIS — O09893 Supervision of other high risk pregnancies, third trimester: Secondary | ICD-10-CM

## 2015-06-19 DIAGNOSIS — Z8751 Personal history of pre-term labor: Secondary | ICD-10-CM

## 2015-06-19 DIAGNOSIS — Z3493 Encounter for supervision of normal pregnancy, unspecified, third trimester: Secondary | ICD-10-CM

## 2015-06-19 DIAGNOSIS — O09213 Supervision of pregnancy with history of pre-term labor, third trimester: Secondary | ICD-10-CM

## 2015-06-19 DIAGNOSIS — O289 Unspecified abnormal findings on antenatal screening of mother: Secondary | ICD-10-CM

## 2015-06-19 DIAGNOSIS — Z331 Pregnant state, incidental: Secondary | ICD-10-CM

## 2015-06-19 LAB — POCT URINALYSIS DIPSTICK
Glucose, UA: NEGATIVE
Ketones, UA: NEGATIVE
Leukocytes, UA: NEGATIVE
NITRITE UA: NEGATIVE
Protein, UA: NEGATIVE
RBC UA: NEGATIVE

## 2015-06-19 MED ORDER — HYDROXYPROGESTERONE CAPROATE 250 MG/ML IM OIL
250.0000 mg | TOPICAL_OIL | Freq: Once | INTRAMUSCULAR | Status: AC
  Administered 2015-06-19: 250 mg via INTRAMUSCULAR

## 2015-06-19 NOTE — Progress Notes (Signed)
Pt here for 17P. Reports having pressure over weekend, but not as bad now. Pt has no appetite. I advised to try eating small frequent snacks, rather than 3 meals. Pt voiced understanding. Return in 1 week for next shot. [place]

## 2015-06-25 ENCOUNTER — Encounter: Payer: Self-pay | Admitting: *Deleted

## 2015-06-25 ENCOUNTER — Ambulatory Visit (INDEPENDENT_AMBULATORY_CARE_PROVIDER_SITE_OTHER): Payer: Medicaid Other | Admitting: *Deleted

## 2015-06-25 VITALS — BP 126/82 | Ht 62.0 in | Wt 173.0 lb

## 2015-06-25 DIAGNOSIS — Z331 Pregnant state, incidental: Secondary | ICD-10-CM

## 2015-06-25 DIAGNOSIS — O09893 Supervision of other high risk pregnancies, third trimester: Secondary | ICD-10-CM

## 2015-06-25 DIAGNOSIS — O09213 Supervision of pregnancy with history of pre-term labor, third trimester: Secondary | ICD-10-CM | POA: Diagnosis not present

## 2015-06-25 DIAGNOSIS — Z1389 Encounter for screening for other disorder: Secondary | ICD-10-CM

## 2015-06-25 MED ORDER — HYDROXYPROGESTERONE CAPROATE 250 MG/ML IM OIL
250.0000 mg | TOPICAL_OIL | Freq: Once | INTRAMUSCULAR | Status: AC
  Administered 2015-06-25: 250 mg via INTRAMUSCULAR

## 2015-06-25 NOTE — Progress Notes (Signed)
Patient ID: Sarah Cross, female   DOB: 1980/12/01, 34 y.o.   MRN: [id number] Pt here to get 17P injection. Pt states that she has a lot of pain at night but no contractions. Pt states that she has no appetite. Pt states that she is very uncomfortable.

## 2015-06-26 ENCOUNTER — Encounter: Payer: Medicaid Other | Admitting: Advanced Practice Midwife

## 2015-07-02 ENCOUNTER — Ambulatory Visit (INDEPENDENT_AMBULATORY_CARE_PROVIDER_SITE_OTHER): Payer: Medicaid Other | Admitting: Advanced Practice Midwife

## 2015-07-02 ENCOUNTER — Encounter: Payer: Self-pay | Admitting: Advanced Practice Midwife

## 2015-07-02 VITALS — BP 126/80 | HR 80 | Wt 172.0 lb

## 2015-07-02 DIAGNOSIS — Z331 Pregnant state, incidental: Secondary | ICD-10-CM

## 2015-07-02 DIAGNOSIS — Z3493 Encounter for supervision of normal pregnancy, unspecified, third trimester: Secondary | ICD-10-CM

## 2015-07-02 DIAGNOSIS — O09213 Supervision of pregnancy with history of pre-term labor, third trimester: Secondary | ICD-10-CM

## 2015-07-02 DIAGNOSIS — R768 Other specified abnormal immunological findings in serum: Secondary | ICD-10-CM | POA: Insufficient documentation

## 2015-07-02 DIAGNOSIS — O09893 Supervision of other high risk pregnancies, third trimester: Secondary | ICD-10-CM

## 2015-07-02 DIAGNOSIS — Z1389 Encounter for screening for other disorder: Secondary | ICD-10-CM

## 2015-07-02 LAB — POCT URINALYSIS DIPSTICK
Blood, UA: NEGATIVE
GLUCOSE UA: NEGATIVE
Glucose, UA: NEGATIVE
Glucose, UA: NEGATIVE
Ketones, UA: NEGATIVE
LEUKOCYTES UA: NEGATIVE
NITRITE UA: NEGATIVE
PROTEIN UA: NEGATIVE
PROTEIN UA: NEGATIVE
Protein, UA: NEGATIVE

## 2015-07-02 MED ORDER — ACYCLOVIR 400 MG PO TABS: 400.0000 mg | ORAL_TABLET | Freq: Three times a day (TID) | ORAL | Status: DC

## 2015-07-02 MED ORDER — HYDROXYPROGESTERONE CAPROATE 250 MG/ML IM OIL
250.0000 mg | TOPICAL_OIL | Freq: Once | INTRAMUSCULAR | Status: AC
  Administered 2015-07-02: 250 mg via INTRAMUSCULAR

## 2015-07-02 NOTE — Progress Notes (Signed)
Pt states that she has a terrible hemorrhoid that started hurting really bad Saturday.

## 2015-07-02 NOTE — Progress Notes (Signed)
[id number] [redacted]w[redacted]d Estimated Date of Delivery: 08/05/15  Last menstrual period 11/26/2014.   BP weight and urine results all reviewed and noted.  Please refer to the obstetrical flow sheet for the fundal height and fetal heart rate documentation:  Patient reports good fetal movement, denies any bleeding and no rupture of membranes symptoms or regular contractions. Patient has extremely painful 2-3 cm external hemorrhoid.  Has tried conservative measures. Non thrombosed, but so edematous that tissue has started to breakdown.  Co Exam with Dr. Elonda Husky.  Discussed excision with Dr. Aviva Signs, who can see her Thursday.  All questions were answered.  Orders Placed This Encounter  Procedures  . POCT urinalysis dipstick    Plan:  Continued routine obstetrical care,  Start acyclovir 400mg  TID; Ice/Tuck's witch hazel pads,   Return in about 1 week (around 07/09/2015) for LROB, 17P Weekly (last shot!), schedule CS.

## 2015-07-02 NOTE — Patient Instructions (Addendum)
Use ice, ice, ice.  Witch Hazel pads (or if you can't find those, soak cotton balls in [facility] and keep on hemorroid several times a day.   [address] Dr Aviva Signs Thursday at 11 am

## 2015-07-10 ENCOUNTER — Other Ambulatory Visit: Payer: Medicaid Other | Admitting: *Deleted

## 2015-07-10 ENCOUNTER — Ambulatory Visit (INDEPENDENT_AMBULATORY_CARE_PROVIDER_SITE_OTHER): Payer: Medicaid Other | Admitting: *Deleted

## 2015-07-10 ENCOUNTER — Encounter: Payer: Self-pay | Admitting: *Deleted

## 2015-07-10 VITALS — BP 130/92 | HR 96 | Ht 62.0 in | Wt 174.5 lb

## 2015-07-10 DIAGNOSIS — Z1389 Encounter for screening for other disorder: Secondary | ICD-10-CM

## 2015-07-10 DIAGNOSIS — R768 Other specified abnormal immunological findings in serum: Secondary | ICD-10-CM

## 2015-07-10 DIAGNOSIS — O289 Unspecified abnormal findings on antenatal screening of mother: Secondary | ICD-10-CM

## 2015-07-10 DIAGNOSIS — O09213 Supervision of pregnancy with history of pre-term labor, third trimester: Secondary | ICD-10-CM | POA: Diagnosis not present

## 2015-07-10 DIAGNOSIS — Z3493 Encounter for supervision of normal pregnancy, unspecified, third trimester: Secondary | ICD-10-CM

## 2015-07-10 DIAGNOSIS — Z331 Pregnant state, incidental: Secondary | ICD-10-CM

## 2015-07-10 DIAGNOSIS — Z8751 Personal history of pre-term labor: Secondary | ICD-10-CM

## 2015-07-10 DIAGNOSIS — O09893 Supervision of other high risk pregnancies, third trimester: Secondary | ICD-10-CM

## 2015-07-10 LAB — POCT URINALYSIS DIPSTICK
Glucose, UA: NEGATIVE
KETONES UA: NEGATIVE
Leukocytes, UA: NEGATIVE
Nitrite, UA: NEGATIVE
Protein, UA: NEGATIVE

## 2015-07-10 MED ORDER — HYDROXYPROGESTERONE CAPROATE 250 MG/ML IM OIL
250.0000 mg | TOPICAL_OIL | Freq: Once | INTRAMUSCULAR | Status: AC
  Administered 2015-07-10: 250 mg via INTRAMUSCULAR

## 2015-07-10 NOTE — Progress Notes (Signed)
Pt here for LAST 17P. Reports she thinks hemorrhoid is going down. Still having rectal bleeding due to straining. Pt saw hemorrhoid specialist last week. Pt having rectal itching. Advised she may be healing. Return at scheduled appt. [place]

## 2015-07-12 ENCOUNTER — Encounter: Payer: Self-pay | Admitting: Obstetrics and Gynecology

## 2015-07-12 ENCOUNTER — Ambulatory Visit (INDEPENDENT_AMBULATORY_CARE_PROVIDER_SITE_OTHER): Payer: Medicaid Other | Admitting: Obstetrics and Gynecology

## 2015-07-12 VITALS — BP 110/76 | HR 100 | Wt 175.0 lb

## 2015-07-12 DIAGNOSIS — Z982 Presence of cerebrospinal fluid drainage device: Secondary | ICD-10-CM | POA: Insufficient documentation

## 2015-07-12 DIAGNOSIS — Z1389 Encounter for screening for other disorder: Secondary | ICD-10-CM

## 2015-07-12 DIAGNOSIS — Z369 Encounter for antenatal screening, unspecified: Secondary | ICD-10-CM

## 2015-07-12 DIAGNOSIS — Z331 Pregnant state, incidental: Secondary | ICD-10-CM

## 2015-07-12 DIAGNOSIS — Z3493 Encounter for supervision of normal pregnancy, unspecified, third trimester: Secondary | ICD-10-CM

## 2015-07-12 LAB — POCT URINALYSIS DIPSTICK
Glucose, UA: NEGATIVE
KETONES UA: NEGATIVE
Leukocytes, UA: NEGATIVE
NITRITE UA: NEGATIVE
Protein, UA: NEGATIVE
RBC UA: NEGATIVE

## 2015-07-12 LAB — OB RESULTS CONSOLE GC/CHLAMYDIA
CHLAMYDIA, DNA PROBE: NEGATIVE
GC PROBE AMP, GENITAL: NEGATIVE

## 2015-07-12 LAB — OB RESULTS CONSOLE GBS: GBS: POSITIVE

## 2015-07-12 NOTE — Progress Notes (Signed)
Pt worked in for appointment today for hemorrhoids. Pt states that she is not having as much pain and she has noticed that it has gone down.

## 2015-07-12 NOTE — Progress Notes (Addendum)
Patient ID: Sarah Cross, female   DOB: [birth year], 34 y.o.   MRN: [id number] [redacted]w[redacted]d Estimated Date of Delivery: 08/05/15  Blood pressure 110/76, pulse 100, weight 175 lb (79.379 kg), last menstrual period 11/26/2014.   refer to the ob flow sheet for FH and FHR, also BP, Wt, Urine results:notable for neg  Patient reports  good fetal movement, denies any bleeding and no rupture of membranes symptoms or regular contractions. FHR: 130 FH: 36cm Patient complaints: improving hemorrhoids.  Questions were answered. Assessment:  Plan:  Continued routine obstetrical care,              F/u in 1 week for continued OBGYN care.     By signing my name below, I, Terressa Koyanagi, attest that this documentation has been prepared under the direction and in the presence of Mallory Shirk, MD. Electronically Signed: Terressa Koyanagi, ED Scribe. 07/12/2015. 9:23 AM.  I personally performed the services described in this documentation, which was SCRIBED in my presence. The recorded information has been reviewed and considered accurate. It has been edited as necessary during review. Jonnie Kind, MD

## 2015-07-15 LAB — GC/CHLAMYDIA PROBE AMP
Chlamydia trachomatis, NAA: NEGATIVE
Neisseria gonorrhoeae by PCR: NEGATIVE

## 2015-07-16 LAB — STREP GP B SUSCEPTIBILITY

## 2015-07-16 LAB — STREP GP B NAA+RFLX: STREP GP B NAA+RFLX: POSITIVE — AB

## 2015-07-17 ENCOUNTER — Emergency Department (HOSPITAL_COMMUNITY)
Admission: EM | Admit: 2015-07-17 | Discharge: 2015-07-17 | Disposition: A | Discharge status: Home / Self Care | Payer: Medicaid Other | Attending: Emergency Medicine | Admitting: Emergency Medicine

## 2015-07-17 ENCOUNTER — Encounter (HOSPITAL_COMMUNITY): Payer: Self-pay | Admitting: Emergency Medicine

## 2015-07-17 DIAGNOSIS — Z8619 Personal history of other infectious and parasitic diseases: Secondary | ICD-10-CM | POA: Diagnosis not present

## 2015-07-17 DIAGNOSIS — O9989 Other specified diseases and conditions complicating pregnancy, childbirth and the puerperium: Secondary | ICD-10-CM | POA: Insufficient documentation

## 2015-07-17 DIAGNOSIS — N898 Other specified noninflammatory disorders of vagina: Secondary | ICD-10-CM | POA: Insufficient documentation

## 2015-07-17 DIAGNOSIS — Z91040 Latex allergy status: Secondary | ICD-10-CM | POA: Insufficient documentation

## 2015-07-17 DIAGNOSIS — Z79899 Other long term (current) drug therapy: Secondary | ICD-10-CM | POA: Diagnosis not present

## 2015-07-17 DIAGNOSIS — R1030 Lower abdominal pain, unspecified: Secondary | ICD-10-CM | POA: Diagnosis not present

## 2015-07-17 DIAGNOSIS — Z3A37 37 weeks gestation of pregnancy: Secondary | ICD-10-CM | POA: Diagnosis not present

## 2015-07-17 DIAGNOSIS — R768 Other specified abnormal immunological findings in serum: Secondary | ICD-10-CM

## 2015-07-17 DIAGNOSIS — R109 Unspecified abdominal pain: Secondary | ICD-10-CM

## 2015-07-17 DIAGNOSIS — Z8751 Personal history of pre-term labor: Secondary | ICD-10-CM

## 2015-07-17 DIAGNOSIS — Z86718 Personal history of other venous thrombosis and embolism: Secondary | ICD-10-CM | POA: Insufficient documentation

## 2015-07-17 DIAGNOSIS — Z862 Personal history of diseases of the blood and blood-forming organs and certain disorders involving the immune mechanism: Secondary | ICD-10-CM | POA: Diagnosis not present

## 2015-07-17 DIAGNOSIS — O289 Unspecified abnormal findings on antenatal screening of mother: Secondary | ICD-10-CM

## 2015-07-17 DIAGNOSIS — Z3493 Encounter for supervision of normal pregnancy, unspecified, third trimester: Secondary | ICD-10-CM

## 2015-07-17 LAB — URINALYSIS, ROUTINE W REFLEX MICROSCOPIC
BILIRUBIN URINE: NEGATIVE
Glucose, UA: NEGATIVE mg/dL
Hgb urine dipstick: NEGATIVE
KETONES UR: NEGATIVE mg/dL
LEUKOCYTES UA: NEGATIVE
NITRITE: NEGATIVE
PROTEIN: NEGATIVE mg/dL
Specific Gravity, Urine: 1.01 (ref 1.005–1.030)
UROBILINOGEN UA: 0.2 mg/dL (ref 0.0–1.0)
pH: 6 (ref 5.0–8.0)

## 2015-07-17 MED ORDER — SODIUM CHLORIDE 0.9 % IV SOLN
Freq: Once | INTRAVENOUS | Status: AC
  Administered 2015-07-17: 12:00:00 via INTRAVENOUS

## 2015-07-17 NOTE — ED Notes (Signed)
Spoke with Joellen Jersey, rapid response RN at [facility] and she is monitoring pt.

## 2015-07-17 NOTE — ED Provider Notes (Addendum)
CSN: [id number]     Arrival date & time 07/17/15  1059 History  By signing my name below, I, Terressa Koyanagi, attest that this documentation has been prepared under the direction and in the presence of Milton Ferguson, MD. Electronically Signed: Terressa Koyanagi, ED Scribe. 07/17/2015. 11:32 AM.   Chief Complaint  Patient presents with  . Labor Eval   Patient is a 34 y.o. female presenting with abdominal pain. The history is provided by the patient. No language interpreter was used.  Abdominal Pain Pain location:  Suprapubic Pain quality: pressure   Pain radiates to:  Does not radiate Pain severity:  Severe Onset quality:  Sudden Duration:  10 hours Timing:  Constant Progression:  Unchanged Chronicity:  New Relieved by:  None tried Worsened by:  Nothing tried Ineffective treatments:  None tried Associated symptoms: vaginal discharge   Associated symptoms: no chest pain, no cough, no diarrhea, no fatigue and no hematuria   Risk factors: pregnancy    PCP: Purvis Kilts, MD  OBGYN: Mallory Shirk, MD HPI Comments: Sarah Cross is a 34 y.o. female, [id number], who presents to the Emergency Department complaining of severe suprapubic abd pain onset at 1am this morning. Associated Sx include clear vaginal discharge with a pink tinge.   Past Medical History  Diagnosis Date  . Endometriosis   . Hydrocephalus in newborn [facility])   . Abnormal Pap smear   . Sickle cell anemia (HCC)     has trait  . 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 Pregnant 12/26/2014  . Vaginal Pap smear, abnormal   . Nausea 12/26/2014  . Spotting during pregnancy in first trimester 12/26/2014   Past Surgical History  Procedure Laterality Date  . Bladder tumor excision    . Csf shunt    . Cesarean section     Family History  Problem Relation Age of Onset  . Diabetes Mother   . Cancer Mother     lung  . Hypertension Father   . Heart disease Maternal Grandmother   . Diabetes Maternal  Grandmother   . Heart disease Paternal Grandmother   . Diabetes Paternal Grandmother   . Cancer Paternal Grandfather   . Diabetes Sister   . Heart disease Sister   . Thyroid disease Sister    Social History  Substance Use Topics  . Smoking status: Never Smoker   . Smokeless tobacco: Never Used  . Alcohol Use: No   OB History    Gravida Para Term Preterm AB TAB SAB Ectopic Multiple Living   2 1  1      1      Review of Systems  Constitutional: Negative for appetite change and fatigue.  HENT: Negative for congestion, ear discharge and sinus pressure.   Eyes: Negative for discharge.  Respiratory: Negative for cough.   Cardiovascular: Negative for chest pain.  Gastrointestinal: Positive for abdominal pain. Negative for diarrhea.  Genitourinary: Positive for vaginal discharge. Negative for frequency and hematuria.  Musculoskeletal: Negative for back pain.  Skin: Negative for rash.  Neurological: Negative for seizures and headaches.  Psychiatric/Behavioral: Negative for hallucinations.   Allergies  Latex  Home Medications   Prior to Admission medications   Medication Sig Start Date End Date Taking? Authorizing Provider  acetaminophen (TYLENOL) 500 MG tablet Take 500 mg by mouth as needed.    Historical Provider, MD  acyclovir (ZOVIRAX) 400 MG tablet Take 1 tablet (400 mg total) by mouth 3 (three) times  daily. 07/02/15   Christin Fudge, CNM  omeprazole (PRILOSEC) 20 MG capsule Take 1 capsule (20 mg total) by mouth daily. Patient taking differently: Take 20 mg by mouth as needed.  10/03/13   Jola Schmidt, MD  Pediatric Multiple Vit-C-FA (FLINSTONES GUMMIES OMEGA-3 DHA PO) Take by mouth. Takes 2 daily.    Historical Provider, MD   Triage Vitals: BP 168/110 mmHg  Pulse 77  Temp(Src) 98.7 F (37.1 C) (Oral)  Resp 18  Ht 5\' 2"  (1.575 m)  Wt 175 lb (79.379 kg)  BMI 32.00 kg/m2  SpO2 99%  LMP 11/26/2014 Physical Exam  Constitutional: She is oriented to person, place,  and time. She appears well-developed.  HENT:  Head: Normocephalic.  Eyes: Conjunctivae and EOM are normal. No scleral icterus.  Neck: Neck supple. No thyromegaly present.  Cardiovascular: Normal rate and regular rhythm.  Exam reveals no gallop and no friction rub.   No murmur heard. Pulmonary/Chest: No stridor. She has no wheezes. She has no rales. She exhibits no tenderness.  Abdominal: She exhibits no distension. There is tenderness (mild) in the suprapubic area. There is no rebound.  Genitourinary:  0% effaced, 0% dilated   Musculoskeletal: Normal range of motion. She exhibits no edema.  Lymphadenopathy:    She has no cervical adenopathy.  Neurological: She is oriented to person, place, and time. She exhibits normal muscle tone. Coordination normal.  Skin: No rash noted. No erythema.  Psychiatric: She has a normal mood and affect. Her behavior is normal.    ED Course  Procedures (including critical care time) DIAGNOSTIC STUDIES: Oxygen Saturation is 99% on ra, nl by my interpretation.    COORDINATION OF CARE: 11:25 AM: Discussed treatment plan with pt at bedside; patient verbalizes understanding and agrees with treatment plan.  MDM   Final diagnoses:  None    Patient is [redacted] weeks pregnant. She is having lower abdominal pain. She is not showing any contractions on the monitor. She is not dilated all on exam. Suspect pain and vagina secondary to the pregnancy and the movement of the baby. I spoke with  OB/GYN and is recommended that the patient rest take Tylenol for pain and call them if needed. She is to be seen in the office in 2 days.  The chart was scribed for me under my direct supervision.  I personally performed the history, physical, and medical decision making and all procedures in the evaluation of this patient.Milton Ferguson, MD 07/17/15 1400  Milton Ferguson, MD 07/17/15 1400

## 2015-07-17 NOTE — ED Notes (Signed)
Pt removed from [place] and fhr monitor, ok per EDP. Pt educated on signs and symptoms of labor and reasons to see obgyn or go directly to [facility].

## 2015-07-17 NOTE — ED Notes (Signed)
RN spoke with Joellen Jersey, rapid response nurse, states baby's heart rate is good and pt can be taken off monitor.

## 2015-07-17 NOTE — ED Notes (Signed)
RN at bedside for EDP exam.

## 2015-07-17 NOTE — ED Notes (Signed)
Pt states that she has been having severe pelvic pain since early this morning.  States that she had some clear vaginal discharge this morning with a pink tinge to it.

## 2015-07-17 NOTE — ED Notes (Signed)
RROB spoke with [place] and told that fhr strip is reactive and reassuring and have only seen one contraction. If plan to discharge pt, then need to go over labor precautions i.e. Bleeding, leaking of fluid, contractions that do not cease with rest and fluids, decreased fetal movement- if pt experiences any of these things then she needs to go to her Dr's office or [facility] [place].

## 2015-07-17 NOTE — ED Notes (Signed)
Paged Dr.Ferguson to [phone], per verbal order Dr. Roderic Palau

## 2015-07-17 NOTE — Discharge Instructions (Signed)
Take Tylenol for pain. Rest at home. Follow-up with your doctor Friday as planned. If Tylenol is not taking care of the pain medicine and contact your doctor and they will see you in the office

## 2015-07-19 ENCOUNTER — Ambulatory Visit (INDEPENDENT_AMBULATORY_CARE_PROVIDER_SITE_OTHER): Payer: Medicaid Other | Admitting: Obstetrics and Gynecology

## 2015-07-19 ENCOUNTER — Encounter: Payer: Self-pay | Admitting: Obstetrics and Gynecology

## 2015-07-19 VITALS — BP 130/90 | HR 72 | Wt 178.6 lb

## 2015-07-19 DIAGNOSIS — Z3493 Encounter for supervision of normal pregnancy, unspecified, third trimester: Secondary | ICD-10-CM | POA: Diagnosis not present

## 2015-07-19 DIAGNOSIS — Z3A37 37 weeks gestation of pregnancy: Secondary | ICD-10-CM

## 2015-07-19 DIAGNOSIS — O09213 Supervision of pregnancy with history of pre-term labor, third trimester: Secondary | ICD-10-CM | POA: Diagnosis not present

## 2015-07-19 DIAGNOSIS — Z331 Pregnant state, incidental: Secondary | ICD-10-CM

## 2015-07-19 DIAGNOSIS — Z1389 Encounter for screening for other disorder: Secondary | ICD-10-CM

## 2015-07-19 LAB — POCT URINALYSIS DIPSTICK
Blood, UA: NEGATIVE
GLUCOSE UA: NEGATIVE
Ketones, UA: NEGATIVE
LEUKOCYTES UA: NEGATIVE
Nitrite, UA: NEGATIVE
Protein, UA: NEGATIVE

## 2015-07-19 NOTE — Progress Notes (Signed)
Patient ID: Sarah Cross, female   DOB: Dec 30, 1980, 34 y.o.   MRN: [id number]  [id number] [redacted]w[redacted]d Estimated Date of Delivery: 08/05/15  Blood pressure 130/90, pulse 72, weight 178 lb 9.6 oz (81.012 kg), last menstrual period 11/26/2014.   refer to the ob flow sheet for FH and FHR, also BP, Wt, Urine results: negative  Patient reports  + good fetal movement, denies any bleeding and no rupture of membranes symptoms or regular contractions.  Patient complaints: She states she went to the ER 2 days ago due to increased abdominal pressure and mild vaginal discharge with pink tinge. She reports these symptoms have resolved at this time and she has no complaints. She wants to take [facility] pills post partum.  FHR:135 FH: 33cm Edema: none Vaginal secretions: pH 4.5  Questions were answered. Assessment: G2P0101 [redacted]w[redacted]d repeat c/s for   Plan:  F/u in 1 week for continued routine obstetrical care, Will schedule c-section for 07/31/15 at 1 pm no  Times available earlier that week.   By signing my name below, I, Erling Conte, attest that this documentation has been prepared under the direction and in the presence of Jonnie Kind, MD. Electronically Signed: Erling Conte, ED Scribe. 07/19/2015. 9:45 AM.  I personally performed the services described in this documentation, which was SCRIBED in my presence. The recorded information has been reviewed and considered accurate. It has been edited as necessary during review. Jonnie Kind, MD

## 2015-07-20 ENCOUNTER — Inpatient Hospital Stay (HOSPITAL_COMMUNITY)
Admission: AD | Admit: 2015-07-20 | Discharge: 2015-07-23 | DRG: 766 | Disposition: A | Discharge status: Home / Self Care | Payer: Medicaid Other | Source: Ambulatory Visit | Attending: Family Medicine | Admitting: Family Medicine

## 2015-07-20 ENCOUNTER — Encounter (HOSPITAL_COMMUNITY): Payer: Self-pay | Admitting: *Deleted

## 2015-07-20 ENCOUNTER — Encounter (HOSPITAL_COMMUNITY)
Admission: AD | Disposition: A | Discharge status: Home / Self Care | Payer: Self-pay | Source: Ambulatory Visit | Attending: Obstetrics & Gynecology

## 2015-07-20 ENCOUNTER — Inpatient Hospital Stay (HOSPITAL_COMMUNITY): Payer: Medicaid Other | Admitting: Anesthesiology

## 2015-07-20 DIAGNOSIS — Z833 Family history of diabetes mellitus: Secondary | ICD-10-CM

## 2015-07-20 DIAGNOSIS — Z8249 Family history of ischemic heart disease and other diseases of the circulatory system: Secondary | ICD-10-CM | POA: Diagnosis not present

## 2015-07-20 DIAGNOSIS — O289 Unspecified abnormal findings on antenatal screening of mother: Secondary | ICD-10-CM | POA: Diagnosis present

## 2015-07-20 DIAGNOSIS — Z98891 History of uterine scar from previous surgery: Secondary | ICD-10-CM

## 2015-07-20 DIAGNOSIS — O99824 Streptococcus B carrier state complicating childbirth: Secondary | ICD-10-CM | POA: Diagnosis present

## 2015-07-20 DIAGNOSIS — Z8751 Personal history of pre-term labor: Secondary | ICD-10-CM

## 2015-07-20 DIAGNOSIS — O09523 Supervision of elderly multigravida, third trimester: Secondary | ICD-10-CM

## 2015-07-20 DIAGNOSIS — O34219 Maternal care for unspecified type scar from previous cesarean delivery: Principal | ICD-10-CM | POA: Diagnosis present

## 2015-07-20 DIAGNOSIS — Z91040 Latex allergy status: Secondary | ICD-10-CM | POA: Diagnosis not present

## 2015-07-20 DIAGNOSIS — Z3A37 37 weeks gestation of pregnancy: Secondary | ICD-10-CM | POA: Diagnosis not present

## 2015-07-20 DIAGNOSIS — Z8551 Personal history of malignant neoplasm of bladder: Secondary | ICD-10-CM

## 2015-07-20 DIAGNOSIS — Z982 Presence of cerebrospinal fluid drainage device: Secondary | ICD-10-CM

## 2015-07-20 DIAGNOSIS — Z86718 Personal history of other venous thrombosis and embolism: Secondary | ICD-10-CM

## 2015-07-20 DIAGNOSIS — O4292 Full-term premature rupture of membranes, unspecified as to length of time between rupture and onset of labor: Secondary | ICD-10-CM | POA: Diagnosis present

## 2015-07-20 DIAGNOSIS — O9902 Anemia complicating childbirth: Secondary | ICD-10-CM | POA: Diagnosis present

## 2015-07-20 DIAGNOSIS — O134 Gestational [pregnancy-induced] hypertension without significant proteinuria, complicating childbirth: Secondary | ICD-10-CM

## 2015-07-20 DIAGNOSIS — D573 Sickle-cell trait: Secondary | ICD-10-CM | POA: Diagnosis present

## 2015-07-20 DIAGNOSIS — O4290 Premature rupture of membranes, unspecified as to length of time between rupture and onset of labor, unspecified weeks of gestation: Secondary | ICD-10-CM

## 2015-07-20 DIAGNOSIS — O09529 Supervision of elderly multigravida, unspecified trimester: Secondary | ICD-10-CM

## 2015-07-20 DIAGNOSIS — R768 Other specified abnormal immunological findings in serum: Secondary | ICD-10-CM

## 2015-07-20 DIAGNOSIS — O98211 Gonorrhea complicating pregnancy, first trimester: Secondary | ICD-10-CM | POA: Diagnosis present

## 2015-07-20 HISTORY — DX: Personal history of other venous thrombosis and embolism: Z86.718

## 2015-07-20 LAB — URINALYSIS, ROUTINE W REFLEX MICROSCOPIC
Bilirubin Urine: NEGATIVE
GLUCOSE, UA: NEGATIVE mg/dL
HGB URINE DIPSTICK: NEGATIVE
Ketones, ur: NEGATIVE mg/dL
Leukocytes, UA: NEGATIVE
Nitrite: NEGATIVE
PH: 6 (ref 5.0–8.0)
PROTEIN: NEGATIVE mg/dL
Specific Gravity, Urine: 1.01 (ref 1.005–1.030)
Urobilinogen, UA: 0.2 mg/dL (ref 0.0–1.0)

## 2015-07-20 LAB — COMPREHENSIVE METABOLIC PANEL
ALT: 23 U/L (ref 14–54)
ANION GAP: 9 (ref 5–15)
AST: 24 U/L (ref 15–41)
Albumin: 3 g/dL — ABNORMAL LOW (ref 3.5–5.0)
Alkaline Phosphatase: 150 U/L — ABNORMAL HIGH (ref 38–126)
BILIRUBIN TOTAL: 0.8 mg/dL (ref 0.3–1.2)
BUN: 5 mg/dL — AB (ref 6–20)
CO2: 20 mmol/L — ABNORMAL LOW (ref 22–32)
Calcium: 8.7 mg/dL — ABNORMAL LOW (ref 8.9–10.3)
Chloride: 108 mmol/L (ref 101–111)
Creatinine, Ser: 0.69 mg/dL (ref 0.44–1.00)
GFR calc Af Amer: >60 mL/min (ref >60)
Glucose, Bld: 83 mg/dL (ref 65–99)
POTASSIUM: 3.4 mmol/L — AB (ref 3.5–5.1)
Sodium: 137 mmol/L (ref 135–145)
TOTAL PROTEIN: 7 g/dL (ref 6.5–8.1)

## 2015-07-20 LAB — TYPE AND SCREEN
ABO/RH(D): B POS
ANTIBODY SCREEN: NEGATIVE

## 2015-07-20 LAB — RPR: RPR Ser Ql: NONREACTIVE

## 2015-07-20 LAB — CBC
HCT: 34.1 % — ABNORMAL LOW (ref 36.0–46.0)
Hemoglobin: 11.7 g/dL — ABNORMAL LOW (ref 12.0–15.0)
MCH: 27.2 pg (ref 26.0–34.0)
MCHC: 34.3 g/dL (ref 30.0–36.0)
MCV: 79.3 fL (ref 78.0–100.0)
PLATELETS: 266 10*3/uL (ref 150–400)
RBC: 4.3 MIL/uL (ref 3.87–5.11)
RDW: 15 % (ref 11.5–15.5)
WBC: 9.9 10*3/uL (ref 4.0–10.5)

## 2015-07-20 LAB — PROTEIN / CREATININE RATIO, URINE
CREATININE, URINE: 61 mg/dL
Protein Creatinine Ratio: 0.11 mg/mg{Cre} (ref 0.00–0.15)
Total Protein, Urine: 7 mg/dL

## 2015-07-20 LAB — POCT FERN TEST: POCT Fern Test: POSITIVE

## 2015-07-20 LAB — ABO/RH: ABO/RH(D): B POS

## 2015-07-20 SURGERY — Surgical Case
Anesthesia: Spinal

## 2015-07-20 MED ORDER — KETOROLAC TROMETHAMINE 30 MG/ML IJ SOLN: 30.0000 mg | Freq: Four times a day (QID) | INTRAMUSCULAR | Status: AC | PRN

## 2015-07-20 MED ORDER — LANOLIN HYDROUS EX OINT: 1.0000 "application " | TOPICAL_OINTMENT | CUTANEOUS | Status: DC | PRN

## 2015-07-20 MED ORDER — MENTHOL 3 MG MT LOZG: 1.0000 | LOZENGE | OROMUCOSAL | Status: DC | PRN

## 2015-07-20 MED ORDER — LACTATED RINGERS IV BOLUS (SEPSIS): 1000.0000 mL | Freq: Once | INTRAVENOUS | Status: DC

## 2015-07-20 MED ORDER — OXYTOCIN 10 UNIT/ML IJ SOLN
40.0000 [IU] | INTRAVENOUS | Status: DC | PRN
  Administered 2015-07-20: 40 [IU] via INTRAVENOUS

## 2015-07-20 MED ORDER — NALOXONE HCL 0.4 MG/ML IJ SOLN: 0.4000 mg | INTRAMUSCULAR | Status: DC | PRN

## 2015-07-20 MED ORDER — PHENYLEPHRINE 8 MG IN D5W 100 ML (0.08MG/ML) PREMIX OPTIME
INJECTION | INTRAVENOUS | Status: DC | PRN
  Administered 2015-07-20: 60 ug/min via INTRAVENOUS

## 2015-07-20 MED ORDER — NALBUPHINE HCL 10 MG/ML IJ SOLN: 5.0000 mg | INTRAMUSCULAR | Status: DC | PRN

## 2015-07-20 MED ORDER — SCOPOLAMINE 1 MG/3DAYS TD PT72
1.0000 | MEDICATED_PATCH | Freq: Once | TRANSDERMAL | Status: AC
Start: 2015-07-20 — End: 2015-07-23
  Administered 2015-07-20: 1.5 mg via TRANSDERMAL

## 2015-07-20 MED ORDER — MORPHINE SULFATE (PF) 0.5 MG/ML IJ SOLN
INTRAMUSCULAR | Status: DC | PRN
  Administered 2015-07-20: .2 mg via INTRATHECAL

## 2015-07-20 MED ORDER — DIPHENHYDRAMINE HCL 50 MG/ML IJ SOLN: 12.5000 mg | INTRAMUSCULAR | Status: DC | PRN

## 2015-07-20 MED ORDER — PRENATAL MULTIVITAMIN CH
1.0000 | ORAL_TABLET | Freq: Every day | ORAL | Status: DC
  Administered 2015-07-21 – 2015-07-22 (×2): 1 via ORAL
  Filled 2015-07-20 (×2): qty 1

## 2015-07-20 MED ORDER — DIBUCAINE 1 % RE OINT: 1.0000 "application " | TOPICAL_OINTMENT | RECTAL | Status: DC | PRN

## 2015-07-20 MED ORDER — MORPHINE SULFATE (PF) 0.5 MG/ML IJ SOLN
INTRAMUSCULAR | Status: AC
  Filled 2015-07-20: qty 100

## 2015-07-20 MED ORDER — WITCH HAZEL-GLYCERIN EX PADS: 1.0000 "application " | MEDICATED_PAD | CUTANEOUS | Status: DC | PRN

## 2015-07-20 MED ORDER — SODIUM CHLORIDE 0.9 % IJ SOLN: 3.0000 mL | INTRAMUSCULAR | Status: DC | PRN

## 2015-07-20 MED ORDER — LACTATED RINGERS IV SOLN: INTRAVENOUS | Status: DC

## 2015-07-20 MED ORDER — OXYCODONE-ACETAMINOPHEN 5-325 MG PO TABS: 2.0000 | ORAL_TABLET | ORAL | Status: DC | PRN

## 2015-07-20 MED ORDER — CEFAZOLIN SODIUM-DEXTROSE 2-3 GM-% IV SOLR
INTRAVENOUS | Status: DC | PRN
  Administered 2015-07-20: 2 g via INTRAVENOUS

## 2015-07-20 MED ORDER — SENNOSIDES-DOCUSATE SODIUM 8.6-50 MG PO TABS
2.0000 | ORAL_TABLET | ORAL | Status: DC
  Administered 2015-07-20 – 2015-07-22 (×3): 2 via ORAL
  Filled 2015-07-20 (×3): qty 2

## 2015-07-20 MED ORDER — LACTATED RINGERS IV SOLN
INTRAVENOUS | Status: DC
  Administered 2015-07-20 (×3): via INTRAVENOUS

## 2015-07-20 MED ORDER — MEPERIDINE HCL 25 MG/ML IJ SOLN: 6.2500 mg | INTRAMUSCULAR | Status: DC | PRN

## 2015-07-20 MED ORDER — ONDANSETRON HCL 4 MG/2ML IJ SOLN: 4.0000 mg | Freq: Once | INTRAMUSCULAR | Status: DC | PRN

## 2015-07-20 MED ORDER — ONDANSETRON HCL 4 MG/2ML IJ SOLN: 4.0000 mg | Freq: Three times a day (TID) | INTRAMUSCULAR | Status: DC | PRN

## 2015-07-20 MED ORDER — NALBUPHINE HCL 10 MG/ML IJ SOLN: 5.0000 mg | Freq: Once | INTRAMUSCULAR | Status: AC | PRN

## 2015-07-20 MED ORDER — SIMETHICONE 80 MG PO CHEW
80.0000 mg | CHEWABLE_TABLET | ORAL | Status: DC
  Administered 2015-07-20 – 2015-07-22 (×3): 80 mg via ORAL
  Filled 2015-07-20 (×3): qty 1

## 2015-07-20 MED ORDER — FENTANYL CITRATE (PF) 100 MCG/2ML IJ SOLN: 25.0000 ug | INTRAMUSCULAR | Status: DC | PRN

## 2015-07-20 MED ORDER — SODIUM CHLORIDE 0.9 % IR SOLN
Status: DC | PRN
  Administered 2015-07-20: 1000 mL

## 2015-07-20 MED ORDER — CEFAZOLIN SODIUM-DEXTROSE 2-3 GM-% IV SOLR
INTRAVENOUS | Status: AC
  Filled 2015-07-20: qty 50

## 2015-07-20 MED ORDER — DIPHENHYDRAMINE HCL 25 MG PO CAPS
25.0000 mg | ORAL_CAPSULE | ORAL | Status: DC | PRN
  Administered 2015-07-21: 25 mg via ORAL
  Filled 2015-07-20: qty 1

## 2015-07-20 MED ORDER — OXYCODONE-ACETAMINOPHEN 5-325 MG PO TABS
1.0000 | ORAL_TABLET | ORAL | Status: DC | PRN
  Administered 2015-07-22 (×2): 1 via ORAL
  Filled 2015-07-20 (×2): qty 1

## 2015-07-20 MED ORDER — PHENYLEPHRINE HCL 10 MG/ML IJ SOLN: INTRAMUSCULAR | Status: DC | PRN

## 2015-07-20 MED ORDER — SCOPOLAMINE 1 MG/3DAYS TD PT72
MEDICATED_PATCH | TRANSDERMAL | Status: AC
  Filled 2015-07-20: qty 1

## 2015-07-20 MED ORDER — CITRIC ACID-SODIUM CITRATE 334-500 MG/5ML PO SOLN
30.0000 mL | Freq: Once | ORAL | Status: AC
  Administered 2015-07-20: 30 mL via ORAL
  Filled 2015-07-20: qty 15

## 2015-07-20 MED ORDER — KETOROLAC TROMETHAMINE 30 MG/ML IJ SOLN
30.0000 mg | Freq: Four times a day (QID) | INTRAMUSCULAR | Status: AC | PRN
  Administered 2015-07-20: 30 mg via INTRAMUSCULAR

## 2015-07-20 MED ORDER — DIPHENHYDRAMINE HCL 25 MG PO CAPS
25.0000 mg | ORAL_CAPSULE | Freq: Four times a day (QID) | ORAL | Status: DC | PRN
  Filled 2015-07-20: qty 1

## 2015-07-20 MED ORDER — ONDANSETRON HCL 4 MG/2ML IJ SOLN
INTRAMUSCULAR | Status: AC
  Filled 2015-07-20: qty 2

## 2015-07-20 MED ORDER — IBUPROFEN 600 MG PO TABS
600.0000 mg | ORAL_TABLET | Freq: Four times a day (QID) | ORAL | Status: DC
  Administered 2015-07-20 – 2015-07-23 (×11): 600 mg via ORAL
  Filled 2015-07-20 (×11): qty 1

## 2015-07-20 MED ORDER — FENTANYL CITRATE (PF) 100 MCG/2ML IJ SOLN
INTRAMUSCULAR | Status: AC
  Filled 2015-07-20: qty 4

## 2015-07-20 MED ORDER — NALBUPHINE HCL 10 MG/ML IJ SOLN
INTRAMUSCULAR | Status: AC
  Filled 2015-07-20: qty 1

## 2015-07-20 MED ORDER — TETANUS-DIPHTH-ACELL PERTUSSIS 5-2.5-18.5 LF-MCG/0.5 IM SUSP: 0.5000 mL | Freq: Once | INTRAMUSCULAR | Status: DC

## 2015-07-20 MED ORDER — ENOXAPARIN SODIUM 40 MG/0.4ML ~~LOC~~ SOLN
40.0000 mg | SUBCUTANEOUS | Status: DC
  Administered 2015-07-20 – 2015-07-22 (×3): 40 mg via SUBCUTANEOUS
  Filled 2015-07-20 (×3): qty 0.4

## 2015-07-20 MED ORDER — OXYTOCIN 10 UNIT/ML IJ SOLN
INTRAMUSCULAR | Status: AC
Start: 2015-07-20 — End: 2015-07-20
  Filled 2015-07-20: qty 4

## 2015-07-20 MED ORDER — OXYTOCIN 40 UNITS IN LACTATED RINGERS INFUSION - SIMPLE MED: 62.5000 mL/h | INTRAVENOUS | Status: AC

## 2015-07-20 MED ORDER — ACETAMINOPHEN 325 MG PO TABS
650.0000 mg | ORAL_TABLET | ORAL | Status: DC | PRN
  Administered 2015-07-21 – 2015-07-22 (×2): 650 mg via ORAL
  Filled 2015-07-20 (×2): qty 2

## 2015-07-20 MED ORDER — BUPIVACAINE IN DEXTROSE 0.75-8.25 % IT SOLN
INTRATHECAL | Status: DC | PRN
  Administered 2015-07-20: 1.6 mL via INTRATHECAL

## 2015-07-20 MED ORDER — FENTANYL CITRATE (PF) 100 MCG/2ML IJ SOLN
INTRAMUSCULAR | Status: DC | PRN
  Administered 2015-07-20: 10 ug via INTRATHECAL

## 2015-07-20 MED ORDER — ONDANSETRON HCL 4 MG/2ML IJ SOLN
INTRAMUSCULAR | Status: DC | PRN
  Administered 2015-07-20: 4 mg via INTRAVENOUS

## 2015-07-20 MED ORDER — FAMOTIDINE IN NACL 20-0.9 MG/50ML-% IV SOLN
20.0000 mg | Freq: Once | INTRAVENOUS | Status: AC
  Administered 2015-07-20: 20 mg via INTRAVENOUS
  Filled 2015-07-20: qty 50

## 2015-07-20 MED ORDER — SIMETHICONE 80 MG PO CHEW: 80.0000 mg | CHEWABLE_TABLET | ORAL | Status: DC | PRN

## 2015-07-20 MED ORDER — SIMETHICONE 80 MG PO CHEW
80.0000 mg | CHEWABLE_TABLET | Freq: Three times a day (TID) | ORAL | Status: DC
  Administered 2015-07-20 – 2015-07-22 (×6): 80 mg via ORAL
  Filled 2015-07-20 (×6): qty 1

## 2015-07-20 MED ORDER — KETOROLAC TROMETHAMINE 30 MG/ML IJ SOLN
INTRAMUSCULAR | Status: AC
  Administered 2015-07-20: 30 mg via INTRAMUSCULAR
  Filled 2015-07-20: qty 1

## 2015-07-20 MED ORDER — NALBUPHINE HCL 10 MG/ML IJ SOLN
5.0000 mg | Freq: Once | INTRAMUSCULAR | Status: AC | PRN
  Administered 2015-07-20: 5 mg via SUBCUTANEOUS

## 2015-07-20 MED ORDER — PENICILLIN G POTASSIUM 5000000 UNITS IJ SOLR
5.0000 10*6.[IU] | Freq: Once | INTRAVENOUS | Status: AC
  Administered 2015-07-20: 5 10*6.[IU] via INTRAVENOUS
  Filled 2015-07-20: qty 5

## 2015-07-20 MED ORDER — NALOXONE HCL 1 MG/ML IJ SOLN
1.0000 ug/kg/h | INTRAMUSCULAR | Status: DC | PRN
  Filled 2015-07-20: qty 2

## 2015-07-20 MED ORDER — PHENYLEPHRINE 8 MG IN D5W 100 ML (0.08MG/ML) PREMIX OPTIME
INJECTION | INTRAVENOUS | Status: AC
  Filled 2015-07-20: qty 100

## 2015-07-20 SURGICAL SUPPLY — 38 items
APL SKNCLS STERI-STRIP NONHPOA (GAUZE/BANDAGES/DRESSINGS)
BENZOIN TINCTURE PRP APPL 2/3 (GAUZE/BANDAGES/DRESSINGS) IMPLANT
CLAMP CORD UMBIL (MISCELLANEOUS) IMPLANT
CLOSURE WOUND 1/2 X4 (GAUZE/BANDAGES/DRESSINGS)
CLOTH BEACON ORANGE TIMEOUT ST (SAFETY) ×3 IMPLANT
DRAPE SHEET LG 3/4 BI-LAMINATE (DRAPES) IMPLANT
DRSG OPSITE POSTOP 4X10 (GAUZE/BANDAGES/DRESSINGS) ×3 IMPLANT
DURAPREP 26ML APPLICATOR (WOUND CARE) ×3 IMPLANT
ELECT REM PT RETURN 9FT ADLT (ELECTROSURGICAL) ×3
ELECTRODE REM PT RTRN 9FT ADLT (ELECTROSURGICAL) ×1 IMPLANT
EXTRACTOR VACUUM KIWI (MISCELLANEOUS) IMPLANT
GLOVE BIO SURGEON ST LM GN SZ9 (GLOVE) ×3 IMPLANT
GLOVE BIOGEL PI IND STRL 9 (GLOVE) ×1 IMPLANT
GLOVE BIOGEL PI INDICATOR 9 (GLOVE) ×2
GOWN STRL REUS W/TWL 2XL LVL3 (GOWN DISPOSABLE) ×3 IMPLANT
GOWN STRL REUS W/TWL LRG LVL3 (GOWN DISPOSABLE) ×3 IMPLANT
HEMOSTAT SURGICEL 2X3 (HEMOSTASIS) ×2 IMPLANT
NDL HYPO 25X5/8 SAFETYGLIDE (NEEDLE) IMPLANT
NEEDLE HYPO 25X5/8 SAFETYGLIDE (NEEDLE) IMPLANT
NS IRRIG 1000ML POUR BTL (IV SOLUTION) ×3 IMPLANT
PACK C SECTION WH (CUSTOM PROCEDURE TRAY) ×3 IMPLANT
PAD ABD 8X7 1/2 STERILE (GAUZE/BANDAGES/DRESSINGS) ×2 IMPLANT
PAD OB MATERNITY 4.3X12.25 (PERSONAL CARE ITEMS) ×3 IMPLANT
PENCIL SMOKE EVAC W/HOLSTER (ELECTROSURGICAL) ×3 IMPLANT
RTRCTR C-SECT PINK 25CM LRG (MISCELLANEOUS) ×2 IMPLANT
RTRCTR C-SECT PINK 34CM XLRG (MISCELLANEOUS) IMPLANT
SPONGE GAUZE 4X4 12PLY STER LF (GAUZE/BANDAGES/DRESSINGS) ×2 IMPLANT
STRIP CLOSURE SKIN 1/2X4 (GAUZE/BANDAGES/DRESSINGS) IMPLANT
SUT MNCRL 0 VIOLET CTX 36 (SUTURE) ×2 IMPLANT
SUT MONOCRYL 0 CTX 36 (SUTURE) ×4
SUT VIC AB 0 CT1 27 (SUTURE) ×3
SUT VIC AB 0 CT1 27XBRD ANBCTR (SUTURE) ×1 IMPLANT
SUT VIC AB 2-0 CT1 27 (SUTURE) ×3
SUT VIC AB 2-0 CT1 TAPERPNT 27 (SUTURE) ×1 IMPLANT
SUT VIC AB 4-0 KS 27 (SUTURE) ×3 IMPLANT
SYR BULB IRRIGATION 50ML (SYRINGE) IMPLANT
TOWEL OR 17X24 6PK STRL BLUE (TOWEL DISPOSABLE) ×3 IMPLANT
TRAY FOLEY CATH SILVER 14FR (SET/KITS/TRAYS/PACK) ×3 IMPLANT

## 2015-07-20 NOTE — Anesthesia Procedure Notes (Signed)
Spinal Patient location during procedure: OR Staffing Anesthesiologist: Jocie Meroney Performed by: anesthesiologist  Preanesthetic Checklist Completed: patient identified, site marked, surgical consent, pre-op evaluation, timeout performed, IV checked, risks and benefits discussed and monitors and equipment checked Spinal Block Patient position: sitting Prep: DuraPrep Patient monitoring: continuous pulse ox, blood pressure and heart rate Approach: midline Location: L3-4 Injection technique: single-shot Needle Needle type: Sprotte  Needle gauge: 24 G Needle length: 9 cm Additional Notes Functioning IV was confirmed and monitors were applied. Sterile prep and drape, including hand hygiene, mask and sterile gloves were used. The patient was positioned and the spine was prepped. The skin was anesthetized with lidocaine.  Free flow of clear CSF was obtained prior to injecting local anesthetic into the CSF.  The spinal needle aspirated freely following injection.  The needle was carefully withdrawn.  The patient tolerated the procedure well. Consent was obtained prior to procedure with all questions answered and concerns addressed. Risks including but not limited to bleeding, infection, nerve damage, paralysis, failed block, inadequate analgesia, allergic reaction, high spinal, itching and headache were discussed and the patient wished to proceed.   Sarah Space, MD     

## 2015-07-20 NOTE — Lactation Note (Signed)
This note was copied from the chart of Sarah [employer]. Lactation Consultation Note  Patient Name: Sarah Cross HUTML'Y Date: 07/20/2015 Reason for consult: Initial assessment   Met with mom of 20 hour old infant. Baby is 37w 5d and >6 lb. She has had 2 BF for 13-35 minutes and 4 voids since birth. Mom is a G2 and BF her 36 yo son for 3-4 months and then pumped EBM for him. Discussed supply and demand with need to feed 8-12 x in 24 hours at first feeding cues. Asked mom to awaken at 3 hours to feed due to size and Gestational age and if infant wont feed to place STS. Infant awakened since had been 4.5 hours since last feed, she awakened easily. Assisted mom in latching infant to left breast in cross cradle hold. Infant latched easily with flanged lips, rhythmic sucking and intermittent swallows. Enc mom to awaken as needed to have a good feeding with at least 15 minutes of active sucking. Discussed NL NB feeding behavior, feeding cues, supply and demand, I/O, awakening techniques, massage/compression of breast with feeding, and feeding 8-12 x in 24 hours, awakening at 3 hours as needed. Mom is able to  Hand express colostrum. Mom is active with [facility] in [facility] and has an appt on 10/24. She was asking about renting a pump, told her if medically indicated we could rent her a pump at D/C with $30 deposit. Mom voiced understanding to teaching although was drowsy. Enc mom to place baby in crib after feeding and to take a nap between feeds. FOB in room and dozing on couch, encouraged him to assist mom after feeding to place baby in crib, he voiced he would do so. [facility] Brochure given informed of op phone #, OP appointments, BF Resources and BF Support Groups. Mom reported she had a LC at her [facility] office. Advised mom to call with questions/concerns prn.     Maternal Data Formula Feeding for Exclusion: No Has patient been taught Hand Expression?: Yes Does the patient have breastfeeding  experience prior to this delivery?: Yes  Feeding Feeding Type: Breast Fed  LATCH Score/Interventions Latch: Grasps breast easily, tongue down, lips flanged, rhythmical sucking. Intervention(s): Assist with latch;Adjust position;Breast massage;Breast compression  Audible Swallowing: Spontaneous and intermittent Intervention(s): Skin to skin;Hand expression  Type of Nipple: Everted at rest and after stimulation  Comfort (Breast/Nipple): Soft / non-tender     Hold (Positioning): Assistance needed to correctly position infant at breast and maintain latch. Intervention(s): Breastfeeding basics reviewed;Support Pillows;Position options;Skin to skin  LATCH Score: 9  Lactation Tools Discussed/Used WIC Program: Yes [facility])   Consult Status Consult Status: PRN Follow-up type: In-patient    Sarah Cross 07/20/2015, 6:37 PM

## 2015-07-20 NOTE — Progress Notes (Signed)
CRNA in to attempt IV start

## 2015-07-20 NOTE — H&P (Signed)
Sarah Cross is a 34 y.o. female presenting for SROM clear at 61. Denies contractions, VB. -Prior C/S in 2005. Plans repeat. Does not want BTL. Last solid food 2030. Last liquid was sips of water at 0330.  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 No complications.  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 On Acyclovir for prophylaxis. No Prodrome or sores today.   - Latex allergy  Clinic [facility]  Initiated Care at  [place], [place], 1st baby, dating  Dating By 9wk u/s  Pap 01/16/15: neg w/ -HRHPV  GC/CT Initial:   +/-  POC:             36+wks:  Genetic Screen NT/IT: 1L140 OSB/1:6 DSR CfDNA neg  CF screen neg  Anatomic [place] normal  Flu vaccine   Tdap Recommended ~ 28wks  Glucose Screen  2 hr  82/132/92  GBS POS  Feed Preference Breast/bottle  Contraception POP's  Circumcision n/a  Childbirth Classes   Pediatrician [place]   Patient Active Problem List   Diagnosis Date Noted  . History of DVT of lower extremity   . 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 HSV-2 seropositive 07/02/2015  . Abnormal finding on antenatal screening of mother 02/23/2015  . Ovarian cyst, left 01/28/2015  . Gonorrhea affecting pregnancy in first trimester, antepartum 01/19/2015  . Sickle cell trait ([place]) 01/19/2015  . Supervision of normal pregnancy 01/16/2015  . AMA (advanced maternal age) multigravida 35+ 01/16/2015  . History of preterm delivery 01/16/2015  . H/O cesarean section complicating pregnancy 11/94/1740  . Nausea 12/26/2014  . 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 Dysplasia of cervix, unspecified 04/07/2013  . 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 Endometriosis 02/13/2013    History OB History    Gravida Para Term Preterm AB TAB SAB Ectopic Multiple Living   2 1  1      1      Past Medical  History  Diagnosis Date  . Endometriosis   . Hydrocephalus in newborn [facility])   . Abnormal Pap smear   . Sickle cell anemia (HCC)     has trait  . 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 Pregnant 12/26/2014  . Vaginal Pap smear, abnormal   . Nausea 12/26/2014  . Spotting during pregnancy in first trimester 12/26/2014  . History of DVT of lower extremity 2008    1 week after bladder surgery   Past Surgical History  Procedure Laterality Date  . Bladder tumor excision    . Csf shunt    . Cesarean section     Family History: family history includes Cancer in her mother and paternal grandfather; Diabetes in her maternal grandmother, mother, paternal grandmother, and sister; Heart disease in her maternal grandmother, paternal grandmother, and sister; Hypertension in her father; Thyroid disease in her sister. Social History:  reports that she has never smoked. She has never used smokeless tobacco. She reports that she does not drink alcohol or use illicit drugs.   Prenatal Transfer Tool  Maternal Diabetes: No Genetic Screening: Abnormal:  Results: Elevated risk of Trisomy 21 Maternal Ultrasounds/Referrals: Normal Fetal Ultrasounds or other Referrals:  None Maternal Substance Abuse:  No Significant Maternal Medications:  Meds include: Other:  Significant Maternal Lab Results:  Lab values include: Group B  Strep positive Other Comments:  Pajaros trait (FB stutus unknown), HSV 2+, on acyclovir, Pos chlamydia in pregnancy, DSR 1:6. Normal NIPS.   Review of Systems  Constitutional: Negative for fever and chills.  Eyes: Negative for double vision.  Cardiovascular: Negative for leg swelling.  Gastrointestinal: Negative for abdominal pain.       Pos LOF. No VB.   Neurological: Negative for headaches.    Dilation: Fingertip Effacement (%): 50 Station: -2 Exam by:: Joelene Millin Whitehurst-Boyd RN Blood pressure 141/83, pulse 90, temperature 98.8 F (37.1 C), temperature source Oral,  resp. rate 16, height 5\' 2"  (1.575 m), weight 178 lb (80.74 kg), last menstrual period 11/26/2014, SpO2 100 %. Maternal Exam:  Uterine Assessment: Contraction strength is mild.  Contraction frequency is rare.   Abdomen: Patient reports no abdominal tenderness. Surgical scars: low transverse.   Fundal height is AGA.   Fetal presentation: vertex  Introitus: Normal vulva. Ferning test: positive.  Amniotic fluid character: clear.  Cervix: Cervix evaluated by digital exam.     Fetal Exam Fetal Monitor Review: Mode: ultrasound.   Baseline rate: 140.  Variability: moderate (6-25 bpm).   Pattern: accelerations present and no decelerations.    Fetal State Assessment: Category I - tracings are normal.     Physical Exam  Nursing note and vitals reviewed. Constitutional: She is oriented to person, place, and time. She appears well-developed and well-nourished. No distress.  HENT:  Head: Normocephalic.  Eyes: Conjunctivae are normal.  Cardiovascular: Normal rate, regular rhythm and normal heart sounds.   Respiratory: Effort normal and breath sounds normal. No respiratory distress.  GI: Soft. There is no tenderness.  Genitourinary: Vagina normal.  Musculoskeletal: She exhibits no edema or tenderness.  Neurological: She is alert and oriented to person, place, and time. She has normal reflexes.  Skin: Skin is warm and dry.  Psychiatric: She has a normal mood and affect.    Prenatal labs: ABO, Rh: B/--/-- (04/06 1647) Antibody: Negative (04/06 1647) Rubella:   RPR: Non Reactive (08/03 0909)  HBsAg: Negative (04/06 1647)  HIV:   NR 01/16/2015 GBS: Positive (09/30 0000)   Assessment: 1. Labor: PROM 2. Fetal Wellbeing: Category I  3. Pain Control: None 4. GBS: Pos 5. 37.5 week IUP 6. Hx DVT after surgery in 2008. No Anticoagulant therapy in pregnancy 7. VP shunt 8. Prior C/S, plans repeat 9. Latex allergy 10. Transient HTN in pregnancy  Plan:  1. Admit to BS per consult with  MD 2. Prep OR 3. NPO  4. PCN 5. Informed anesthesia of Hx VP shunt, Latex allergy and Hx DVT 6. Pre-E labs  Manya Silvas 07/20/2015, 7:38 AM

## 2015-07-20 NOTE — Anesthesia Preprocedure Evaluation (Addendum)
Anesthesia Evaluation  Patient identified by MRN, date of birth, ID band Patient awake    Reviewed: Allergy & Precautions, NPO status , Patient's Chart, lab work & pertinent test results  History of Anesthesia Complications Negative for: history of anesthetic complications  Airway Mallampati: II  TM Distance: >3 FB Neck ROM: Full    Dental no notable dental hx. (+) Dental Advisory Given   Pulmonary neg pulmonary ROS,    Pulmonary exam normal breath sounds clear to auscultation       Cardiovascular negative cardio ROS Normal cardiovascular exam Rhythm:Regular Rate:Normal     Neuro/Psych VP shunt in place secondary to hydrocephalus as a child, last revision was when she was 6 and no problems since. Denies all signs and symptoms of increased ICP negative psych ROS   GI/Hepatic negative GI ROS, Neg liver ROS,   Endo/Other  obesity  Renal/GU negative Renal ROS   Hx of bladder cancer s/p resection, did have a DVT at that time and was on anticoagulation, finished course and is not currently on any anticoagulants negative genitourinary   Musculoskeletal negative musculoskeletal ROS (+)   Abdominal   Peds negative pediatric ROS (+)  Hematology negative hematology ROS (+)   Anesthesia Other Findings   Reproductive/Obstetrics (+) Pregnancy                             Anesthesia Physical Anesthesia Plan  ASA: III  Anesthesia Plan: Spinal   Post-op Pain Management:    Induction: Intravenous  Airway Management Planned:   Additional Equipment:   Intra-op Plan:   Post-operative Plan:   Informed Consent: I have reviewed the patients History and Physical, chart, labs and discussed the procedure including the risks, benefits and alternatives for the proposed anesthesia with the patient or authorized representative who has indicated his/her understanding and acceptance.   Dental advisory  given  Plan Discussed with: CRNA  Anesthesia Plan Comments: (Had a prior C/S with neuraxial anesthesia and no problems. Very difficult IV access requiring ultrasound. Patient refused to have a central line placed. )       Anesthesia Quick Evaluation

## 2015-07-20 NOTE — Anesthesia Postprocedure Evaluation (Signed)
  Anesthesia Post-op Note  Patient: Sarah Cross  Procedure(s) Performed: Procedure(s) (LRB): REPEAT CESAREAN SECTION (N/A)  Patient Location: PACU  Anesthesia Type: Spinal  Level of Consciousness: awake and alert   Airway and Oxygen Therapy: Patient Spontanous Breathing  Post-op Pain: mild  Post-op Assessment: Post-op Vital signs reviewed, Patient's Cardiovascular Status Stable, Respiratory Function Stable, Patent Airway and No signs of Nausea or vomiting  Last Vitals:  Filed Vitals:   07/20/15 1300  BP: 122/62  Pulse: 71  Temp: 36.8 C  Resp: 16    Post-op Vital Signs: stable   Complications: No apparent anesthesia complications

## 2015-07-20 NOTE — Transfer of Care (Signed)
Immediate Anesthesia Transfer of Care Note  Patient: Sarah Cross  Procedure(s) Performed: Procedure(s): REPEAT CESAREAN SECTION (N/A)  Patient Location: PACU  Anesthesia Type:Spinal  Level of Consciousness: awake, alert  and oriented  Airway & Oxygen Therapy: Patient Spontanous Breathing  Post-op Assessment: Report given to RN  Post vital signs: Reviewed  Last Vitals:  Filed Vitals:   07/20/15 0853  BP: 137/79  Pulse: 77  Temp: 37.3 C  Resp:     Complications: No apparent anesthesia complications

## 2015-07-20 NOTE — Op Note (Signed)
[name] PROCEDURE DATE: 07/20/2015  PREOPERATIVE DIAGNOSIS: Intrauterine pregnancy at  [redacted]w[redacted]d weeks gestation with SROM; patient declines vag del attempt  POSTOPERATIVE DIAGNOSIS: The same  PROCEDURE:     Cesarean Section  SURGEON:  Dr. Vickii Chafe Vivek Grealish  ASSISTANT: none  INDICATIONS: Sarah Cross is a 34 y.o. G2P0101 at [redacted]w[redacted]d scheduled for cesarean section secondary to SROM and patient declines vag del attempt.  The risks of cesarean section discussed with the patient included but were not limited to: bleeding which may require transfusion or reoperation; infection which may require antibiotics; injury to bowel, bladder, ureters or other surrounding organs; injury to the fetus; need for additional procedures including hysterectomy in the event of a life-threatening hemorrhage; placental abnormalities wth subsequent pregnancies, incisional problems, thromboembolic phenomenon and other postoperative/anesthesia complications. The patient concurred with the proposed plan, giving informed written consent for the procedure.    FINDINGS:  Viable female infant in cephalic presentation.  Apgars 8 and 9.  Clear amniotic fluid.  Intact placenta, three vessel cord.  Normal uterus, fallopian tubes and ovaries bilaterally.  ANESTHESIA:    Spinal INTRAVENOUS FLUIDS:2500 ml ESTIMATED BLOOD LOSS: 800 ml URINE OUTPUT:  100 ml SPECIMENS: Placenta sent to L&D COMPLICATIONS: None immediate  PROCEDURE IN DETAIL:  The patient received intravenous antibiotics and had sequential compression devices applied to her lower extremities while in the preoperative area.  She was then taken to the operating room where anesthesia was induced and was found to be adequate. A foley catheter was placed into her bladder and attached to Sarah Cross gravity. She was then placed in a dorsal supine position with a leftward tilt, and prepped and draped in a sterile manner. After an adequate timeout was performed, a  Pfannenstiel skin incision was made with scalpel and carried through to the underlying layer of fascia. The fascia was incised in the midline and this incision was extended bilaterally using the Mayo scissors. Kocher clamps were applied to the superior aspect of the fascial incision and the underlying rectus muscles were dissected off bluntly. A similar process was carried out on the inferior aspect of the facial incision. The rectus muscles were separated in the midline bluntly and the peritoneum was entered bluntly. The Alexis self-retaining retractor was introduced into the abdominal cavity. Attention was turned to the lower uterine segment where a transverse hysterotomy was made with a scalpel and extended bilaterally bluntly. The infant was successfully delivered, and cord was clamped and cut and infant was handed over to awaiting neonatology team. Uterine massage was then administered and the placenta delivered intact with three-vessel cord. The uterus was cleared of clot and debris.  The hysterotomy was closed with 0 Vicryl in a running locked fashion, and an imbricating layer was also placed with a 0 Vicryl. Overall, excellent hemostasis was noted. The pelvis copiously irrigated and cleared of all clot and debris. Hemostasis was confirmed on all surfaces.  The peritoneum and the muscles were reapproximated using 0 vicryl interrupted stitches. The fascia was then closed using 0 Vicryl in a running fashion.  The subcutaneous layer was reapproximated with plain gut and the skin was closed in a subcuticular fashion using 3.0 Vicryl. The patient tolerated the procedure well. Sponge, lap, instrument and needle counts were correct x 2. She was taken to the recovery room in stable condition.    Sarah Cross,PEGGYMD  07/20/2015 9:37 AM

## 2015-07-20 NOTE — MAU Note (Signed)
Water broke about 04:55

## 2015-07-20 NOTE — Progress Notes (Signed)
Still no IV access. [place] attempted X 2.  Dr. Jillyn Hidden now at bedside with U/S.

## 2015-07-21 LAB — CBC
HEMATOCRIT: 25.3 % — AB (ref 36.0–46.0)
HEMOGLOBIN: 8.8 g/dL — AB (ref 12.0–15.0)
MCH: 27.7 pg (ref 26.0–34.0)
MCHC: 34.8 g/dL (ref 30.0–36.0)
MCV: 79.6 fL (ref 78.0–100.0)
Platelets: 250 10*3/uL (ref 150–400)
RBC: 3.18 MIL/uL — ABNORMAL LOW (ref 3.87–5.11)
RDW: 14.8 % (ref 11.5–15.5)
WBC: 13.5 10*3/uL — AB (ref 4.0–10.5)

## 2015-07-21 MED ORDER — INFLUENZA VAC SPLIT QUAD 0.5 ML IM SUSY
0.5000 mL | PREFILLED_SYRINGE | INTRAMUSCULAR | Status: AC
  Administered 2015-07-21: 0.5 mL via INTRAMUSCULAR
  Filled 2015-07-21: qty 0.5

## 2015-07-21 NOTE — Progress Notes (Signed)
Subjective: Postpartum Day 1: Cesarean Delivery Patient reports incisional pain and tolerating PO.    Objective: Vital signs in last 24 hours: Temp:  [97.8 F (36.6 C)-99.6 F (37.6 C)] 98.6 F (37 C) (10/09 0418) Pulse Rate:  [58-87] 72 (10/09 0418) Resp:  [0-76] 76 (10/09 0418) BP: (105-137)/(40-83) 107/44 mmHg (10/09 0418) SpO2:  [89 %-100 %] 98 % (10/09 0418)  Physical Exam:  General: alert, cooperative and no distress Lochia: appropriate Uterine Fundus: firm Incision: healing well, no significant drainage DVT Evaluation: No evidence of DVT seen on physical exam.  SCDs on    Recent Labs  07/20/15 0654 07/21/15 0515  HGB 11.7* 8.8*  HCT 34.1* 25.3*    Assessment/Plan: Status post Cesarean section. Doing well postoperatively.  Continue current care.  [facility] 07/21/2015, 7:39 AM

## 2015-07-21 NOTE — Lactation Note (Signed)
This note was copied from the chart of Sarah Cross. Lactation Consultation Note  Patient Name: Sarah Cross [id number] Date: 07/21/2015 Reason for consult: Follow-up assessment;Infant < 6lbs   Follow up with mom . Infant with 9 BF for 10-40 minutes, 3 voids and 3 stools , and 5 % weigh loss in last 24 hours. Mom reports she feels BF is going well. Mom is in a lot of incisional pain currently, moms nurse called for pain medication. She has a lot of visitors in the room currently also. She reports infant fed recently, infant is asleep. Enc mom to continue feeding infant 8-12 x in 24 hours with no longer than 3 hours between feeds. Mom voiced understanding. Mom reports she does not have any nipple soreness today. Will follow up tomorrow  And PRN.    Maternal Data Formula Feeding for Exclusion: No  Feeding Feeding Type: Breast Fed Length of feed: 25 min  LATCH Score/Interventions                      Lactation Tools Discussed/Used     Consult Status Consult Status: Follow-up Date: 07/22/15 Follow-up type: In-patient    Debby Freiberg Ariba Lehnen 07/21/2015, 2:39 PM

## 2015-07-22 ENCOUNTER — Encounter (HOSPITAL_COMMUNITY): Payer: Self-pay | Admitting: Obstetrics and Gynecology

## 2015-07-22 NOTE — Progress Notes (Signed)
Subjective: Postpartum Day 2: Cesarean Delivery Patient reports incisional pain, tolerating PO, + flatus and no problems voiding.    Objective: Vital signs in last 24 hours: Temp:  [98 F (36.7 C)-98.6 F (37 C)] 98.6 F (37 C) (10/09 1828) Pulse Rate:  [72-87] 80 (10/09 1828) Resp:  [18-76] 18 (10/09 1828) BP: (107-122)/(44-66) 111/66 mmHg (10/09 1828) SpO2:  [98 %] 98 % (10/09 0841)  Physical Exam:  General: alert, cooperative, appears stated age and no distress Lochia: appropriate Uterine Fundus: firm Incision: healing well, no significant drainage, no dehiscence, no significant erythema DVT Evaluation: No evidence of DVT seen on physical exam. Negative Homan's sign. No cords or calf tenderness.   Recent Labs  07/20/15 0654 07/21/15 0515  HGB 11.7* 8.8*  HCT 34.1* 25.3*    Assessment/Plan: Status post Cesarean section. Doing well postoperatively.  Continue current care.  LAWSON, MARIE DARLENE 07/22/2015, 3:55 AM

## 2015-07-22 NOTE — Lactation Note (Signed)
This note was copied from the chart of Sarah Cross. Lactation Consultation Note  Patient Name: Sarah Cross Date: 07/22/2015 Reason for consult: Follow-up assessment;Infant weight loss;Infant < 6lbs Mom very concerned with baby's weight loss and frustrated reports "baby is not getting anything at the breast and I get nothing with pumping".  Mom is supplementing baby after feedings. Reviewed with Mom that with pumping she may not obtain any volume for 2-3 days. She just started pumping this am. Reviewed with Mom the importance of baby being at the breast so baby will learn how to BF and pumping now for extra stimulation since baby is early term and less than 5 lbs. Advised we recommend limiting time at the breast so minimize baby burning calories with BF. Mom is demonstrating very little patience with breastfeeding at this time. Discussed the following feeding plan: Try to BF with each feeding for 15 minutes 1 breast, then supplement baby with minimum of 20 ml but up to 30 ml with each feeding every 3 hours, then post pump for 15 minutes both breasts. Alternate breast with the BF each feeding. Mom agreed to plan but LC not sure she is committed. Advised Mom she could pump/bottle feed if this was too overwhelming. At this feeding, baby demonstrated mostly non-nutritive suckling, few good suckling bursts but not consistent. Encouraged to call for assist with the latch.   Maternal Data    Feeding Feeding Type: Formula Nipple Type: Slow - flow Length of feed: 10 min (off/on)  LATCH Score/Interventions Latch: Repeated attempts needed to sustain latch, nipple held in mouth throughout feeding, stimulation needed to elicit sucking reflex. Intervention(s): Adjust position;Assist with latch;Breast massage;Breast compression  Audible Swallowing: None  Type of Nipple: Everted at rest and after stimulation  Comfort (Breast/Nipple): Soft / non-tender     Hold  (Positioning): Assistance needed to correctly position infant at breast and maintain latch. Intervention(s): Breastfeeding basics reviewed;Support Pillows;Position options;Skin to skin  LATCH Score: 6  Lactation Tools Discussed/Used Tools: Pump Breast pump type: Double-Electric Breast Pump WIC Program: Yes   Consult Status Consult Status: Follow-up Date: 07/23/15 Follow-up type: In-patient    Katrine Coho 07/22/2015, 12:40 PM

## 2015-07-23 DIAGNOSIS — Z98891 History of uterine scar from previous surgery: Secondary | ICD-10-CM

## 2015-07-23 MED ORDER — ENOXAPARIN SODIUM 40 MG/0.4ML ~~LOC~~ SOLN: 40.0000 mg | SUBCUTANEOUS | Status: DC

## 2015-07-23 MED ORDER — OXYCODONE-ACETAMINOPHEN 5-325 MG PO TABS: 1.0000 | ORAL_TABLET | ORAL | Status: DC | PRN

## 2015-07-23 MED ORDER — IBUPROFEN 600 MG PO TABS: 600.0000 mg | ORAL_TABLET | Freq: Four times a day (QID) | ORAL | Status: DC

## 2015-07-23 NOTE — Lactation Note (Addendum)
This note was copied from the chart of Sarah [employer]. Lactation Consultation Note  Patient Name: Sarah Cross XKGYJ'E Date: 07/23/2015 Reason for consult: Follow-up assessment;Infant < 6lbs;Other (Comment) (Early Term 37 5/7 weeks )  Baby is 25 hours old and for D/C today. Voids and stools adequate for age. 6% weight loss, Breast feeding  And supplementing after wards . Average amount 20 ml. Latch score range - 04-19-09-6-6-6- . Per mom has  only pumped x1 in the last 24 hours with only drops. Per mom feeling discouraged due to not getting any milk when pumping. [place] asked  Mom if she was watching the pump pieces when pumping , and she said "Yes". LC recommended centering the  pumps pieces and not to watch them. LC mentioned to mom for most moms it makes a big difference and helps the mother relax,  Therefore enhances let down.  Sore nipple and engorgement prevention and tx reviewed. Per mom is [facility] , and has already contacted them for apt. This Thursday for a DEBP. LC recommended to mom  To call [place] back this am and request a WIC pump for today. Also per mom did not want to have to come back to [facility] to return a DEBP .  [place] will FAX WIC DEBP LOANER form to [facility] this am . MOM aware.  [place] reminded mom and family to make sure the entire DEBP kit is packed up at D/C.  Mother informed of post-discharge support and given phone number to the lactation department, including services for phone call assistance;  out-patient appointments; and breastfeeding support group. List of other breastfeeding resources in the community given in the handout.  Encouraged mother to call for problems or concerns related to breastfeeding.  Addendum: 2nd visit this am , LC completed Breast feeding teaching. LC also offered mom a LC O/P apt and mom declined and said I have  Already scheduled with lactation consultant near where I live. When mom was asked the name of the [place] , mom could not  remember. LC stressed the importance of weight checks weekly until gaining well.     It  Maternal Data    Feeding Feeding Type:  (per mom last feeding at 0715 ) Length of feed: 15 min  LATCH Score/Interventions( This latch score was done by the 11-7 RN )  Latch: Repeated attempts needed to sustain latch, nipple held in mouth throughout feeding, stimulation needed to elicit sucking reflex. Intervention(s): Adjust position  Audible Swallowing: None Intervention(s): Skin to skin  Type of Nipple: Everted at rest and after stimulation  Comfort (Breast/Nipple): Soft / non-tender     Hold (Positioning): Assistance needed to correctly position infant at breast and maintain latch. Intervention(s): Breastfeeding basics reviewed  LATCH Score: 6  Lactation Tools Discussed/Used WIC Program: Yes (per mom [place] , [place] Faxed request for [employer] )   Consult Status Consult Status: Follow-up Date: 07/23/15 Follow-up type: In-patient    Myer Haff 07/23/2015, 9:31 AM

## 2015-07-23 NOTE — Discharge Instructions (Signed)
Cesarean Delivery, Care After  Refer to this sheet in the next few weeks. These instructions provide you with information on caring for yourself after your procedure. Your health care provider may also give you specific instructions. Your treatment has been planned according to current medical practices, but problems sometimes occur. Call your health care provider if you have any problems or questions after you go home.  HOME CARE INSTRUCTIONS   Only take over-the-counter or prescription medications as directed by your health care provider.   Do not drink alcohol, especially if you are breastfeeding or taking medication to relieve pain.   Do not chew or smoke tobacco.   Continue to use good perineal care. Good perineal care includes:    Wiping your perineum from front to back.    Keeping your perineum clean.   Check your surgical cut (incision) daily for increased redness, drainage, swelling, or separation of skin.   Clean your incision gently with soap and water every day, and then pat it dry. If your health care provider says it is okay, leave the incision uncovered. Use a bandage (dressing) if the incision is draining fluid or appears irritated. If the adhesive strips across the incision do not fall off within 7 days, carefully peel them off.   Hug a pillow when coughing or sneezing until your incision is healed. This helps to relieve pain.   Do not use tampons or douche until your health care provider says it is okay.   Shower, wash your hair, and take tub baths as directed by your health care provider.   Wear a well-fitting bra that provides breast support.   Limit wearing support panties or control-top hose.   Drink enough fluids to keep your urine clear or pale yellow.   Eat high-fiber foods such as whole grain cereals and breads, brown rice, beans, and fresh fruits and vegetables every day. These foods may help prevent or relieve constipation.   Resume activities such as climbing stairs,  driving, lifting, exercising, or traveling as directed by your health care provider.   Talk to your health care provider about resuming sexual activities. This is dependent upon your risk of infection, your rate of healing, and your comfort and desire to resume sexual activity.   Try to have someone help you with your household activities and your newborn for at least a few days after you leave the hospital.   Rest as much as possible. Try to rest or take a nap when your newborn is sleeping.   Increase your activities gradually.   Keep all of your scheduled postpartum appointments. It is very important to keep your scheduled follow-up appointments. At these appointments, your health care provider will be checking to make sure that you are healing physically and emotionally.  SEEK MEDICAL CARE IF:    You are passing large clots from your vagina. Save any clots to show your health care provider.   You have a foul smelling discharge from your vagina.   You have trouble urinating.   You are urinating frequently.   You have pain when you urinate.   You have a change in your bowel movements.   You have increasing redness, pain, or swelling near your incision.   You have pus draining from your incision.   Your incision is separating.   You have painful, hard, or reddened breasts.   You have a severe headache.   You have blurred vision or see spots.   You feel sad   or depressed.   You have thoughts of hurting yourself or your newborn.   You have questions about your care, the care of your newborn, or medications.   You are dizzy or light-headed.   You have a rash.   You have pain, redness, or swelling at the site of the removed intravenous access (IV) tube.   You have nausea or vomiting.   You stopped breastfeeding and have not had a menstrual period within 12 weeks of stopping.   You are not breastfeeding and have not had a menstrual period within 12 weeks of delivery.   You have a fever.  SEEK  IMMEDIATE MEDICAL CARE IF:   You have persistent pain.   You have chest pain.   You have shortness of breath.   You faint.   You have leg pain.   You have stomach pain.   Your vaginal bleeding saturates 2 or more sanitary pads in 1 hour.  MAKE SURE YOU:    Understand these instructions.   Will watch your condition.   Will get help right away if you are not doing well or get worse.     This information is not intended to replace advice given to you by your health care provider. Make sure you discuss any questions you have with your health care provider.     Document Released: 06/20/2002 Document Revised: 10/19/2014 Document Reviewed: 05/25/2012  Elsevier Interactive Patient Education 2016 [employer]vier Inc[employer]

## 2015-07-23 NOTE — Discharge Summary (Signed)
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   34 y.o. yo G2P1102 at [redacted]w[redacted]d was admitted in [facility] on 07/20/2015. Patient had a labor course significant for PROM, rLTCS. Membrane Rupture Time/Date: 4:55 AM ,07/20/2015   The patient went for cesarean section due to Elective Repeat, and delivered a Viable infant,07/20/2015  Details of operation can be found in separate operative note. Patient had an uncomplicated postpartum course. Given her history of DVT after bladder surgery she received chemoprophylaxis with Lovenox while in the hospital and this will be continued for 6 weeks postpartum.She is ambulating,tolerating a regular diet, passing flatus, and urinating well.  Patient is discharged home in stable condition 07/23/2015.    Physical exam  Filed Vitals:   07/22/15 0610 07/22/15 1755 07/23/15 0624 07/23/15 0900  BP: 117/66 123/70 110/68 114/72  Pulse: 73 72 85 81  Temp: 98 F (36.7 C) 98 F (36.7 C) 98.8 F (37.1 C) 98.7 F (37.1 C)  TempSrc: Oral Oral  Oral Oral  Resp: 18 18 18 18   Height:      Weight:      SpO2:       General: alert, cooperative and no distress. Walking around room without problem.  CV: Regular rate. Lungs: normal WOB Lochia: appropriate Uterine Fundus: firm Incision: Healing well with no significant drainage, No significant erythema, Dressing is clean, dry, and intact DVT Evaluation: No evidence of DVT seen on physical exam. Negative Homan's sign. No significant calf/ankle edema. Labs: Lab Results  Component Value Date   WBC 13.5* 07/21/2015   HGB 8.8* 07/21/2015   HCT 25.3* 07/21/2015   MCV 79.6 07/21/2015   PLT 250 07/21/2015   CMP Latest Ref Rng 07/20/2015  Glucose 65 - 99 mg/dL 83  BUN 6 - 20 mg/dL 5(L)  Creatinine 0.44 - 1.00 mg/dL 0.69  Sodium 135 - 145 mmol/L 137  Potassium 3.5 - 5.1 mmol/L 3.4(L)  Chloride 101 - 111 mmol/L 108  CO2 22 - 32 mmol/L 20(L)  Calcium 8.9 - 10.3 mg/dL 8.7(L)  Total Protein 6.5 - 8.1 g/dL 7.0  Total Bilirubin 0.3 - 1.2 mg/dL 0.8  Alkaline Phos 38 - 126  U/L 150(H)  AST 15 - 41 U/L 24  ALT 14 - 54 U/L 23    Discharge instruction: per After Visit Summary and "Baby and Me Booklet".  Medications:  Current facility-administered medications:  .  acetaminophen (TYLENOL) tablet 650 mg, 650 mg, Oral, Q4H PRN, Peggy Constant, MD, 650 mg at 07/22/15 1031 .  witch hazel-glycerin (TUCKS) pad 1 application, 1 application, Topical, PRN **AND** dibucaine (NUPERCAINAL) 1 % rectal ointment 1 application, 1 application, Rectal, PRN, Peggy Constant, MD .  diphenhydrAMINE (BENADRYL) injection 12.5 mg, 12.5 mg, Intravenous, Q4H PRN **OR** diphenhydrAMINE (BENADRYL) capsule 25 mg, 25 mg, Oral, Q4H PRN, Lauretta Grill, MD, 25 mg at 07/21/15 0013 .  diphenhydrAMINE (BENADRYL) capsule 25 mg, 25 mg, Oral, Q6H PRN, Peggy Constant, MD .  enoxaparin (LOVENOX) injection 40 mg, 40 mg, Subcutaneous, Q24H, Peggy Constant, MD, 40 mg at 07/22/15 2340 .  ibuprofen (ADVIL,MOTRIN) tablet 600 mg, 600 mg, Oral,  4 times per day, Mora Bellman, MD, 600 mg at 07/23/15 0619 .  lactated ringers infusion, , Intravenous, Continuous, [place], [place], Last Rate: 125 mL/hr at 07/20/15 0847 .  lactated ringers infusion, , Intravenous, Continuous, Peggy Constant, MD .  lanolin ointment 1 application, 1 application, Topical, PRN, Peggy Constant, MD .  menthol-cetylpyridinium (CEPACOL) lozenge 3 mg, 1 lozenge, Oral, Q2H PRN, Peggy Constant, MD .  nalbuphine (NUBAIN) injection 5 mg, 5 mg, Intravenous, Q4H PRN **OR** nalbuphine (NUBAIN) injection 5 mg, 5 mg, Subcutaneous, Q4H PRN, Lauretta Grill, MD .  naloxone [facility]) 2 mg in dextrose 5 % 250 mL infusion, 1-4 mcg/kg/hr, Intravenous, Continuous PRN, Lauretta Grill, MD .  naloxone [facility]) injection 0.4 mg, 0.4 mg, Intravenous, PRN **AND** sodium chloride 0.9 % injection 3 mL, 3 mL, Intravenous, PRN, Lauretta Grill, MD .  ondansetron [facility]) injection 4 mg, 4 mg, Intravenous, Q8H PRN, Lauretta Grill, MD .  oxyCODONE-acetaminophen (PERCOCET/ROXICET) 5-325 MG per tablet 1 tablet, 1 tablet, Oral, Q4H PRN, Mora Bellman, MD, 1 tablet at 07/22/15 2044 .  oxyCODONE-acetaminophen (PERCOCET/ROXICET) 5-325 MG per tablet 2 tablet, 2 tablet, Oral, Q4H PRN, Peggy Constant, MD .  prenatal multivitamin tablet 1 tablet, 1 tablet, Oral, Q1200, Mora Bellman, MD, 1 tablet at 07/22/15 1158 .  senna-docusate (Senokot-S) tablet 2 tablet, 2 tablet, Oral, Q24H, Mora Bellman, MD, 2 tablet at 07/22/15 2338 .  simethicone (MYLICON) chewable tablet 80 mg, 80 mg, Oral, TID PC, Peggy Constant, MD, 80 mg at 07/22/15 1745 .  simethicone (MYLICON) chewable tablet 80 mg, 80 mg, Oral, Q24H, Peggy Constant, MD, 80 mg at 07/22/15 2339 .  simethicone (MYLICON) chewable tablet 80 mg, 80 mg, Oral, PRN, Peggy Constant, MD .  Tdap (BOOSTRIX) injection 0.5 mL, 0.5 mL, Intramuscular, Once, Mora Bellman, MD  Current outpatient prescriptions:  .  acetaminophen (TYLENOL) 500 MG tablet, Take 500 mg by mouth daily as needed  for moderate pain. , Disp: , Rfl:  .  acyclovir (ZOVIRAX) 400 MG tablet, Take 1 tablet (400 mg total) by mouth 3 (three) times daily., Disp: 90 tablet, Rfl: 2 .  docusate sodium (COLACE) 100 MG capsule, Take 100-300 mg by mouth daily as needed for mild constipation., Disp: , Rfl:  .  omeprazole (PRILOSEC) 20 MG capsule, Take 1 capsule (20 mg total) by mouth daily. (Patient taking differently: Take 20 mg by mouth as needed (acid reflux). ), Disp: 30 capsule, Rfl: 0 .  Prenatal Vit-Fe Fumarate-FA (PRENATAL MULTIVITAMIN) TABS tablet, Take 1 tablet by mouth daily at 12 noon., Disp: , Rfl:  .  enoxaparin (LOVENOX) 40 MG/0.4ML injection, Inject 0.4 mLs (40 mg total) into the skin daily., Disp: 16.8 mL, Rfl: 0 .  ibuprofen (ADVIL,MOTRIN) 600 MG tablet, Take 1 tablet (600 mg total) by mouth every 6 (six) hours., Disp: 30 tablet, Rfl: 0 .  oxyCODONE-acetaminophen (PERCOCET/ROXICET) 5-325 MG tablet, Take 1 tablet by mouth every 4 (four) hours as needed (for pain scale 4-7)., Disp: 20 tablet, Rfl: 0  Diet: routine diet  Activity: Advance as tolerated. Pelvic rest for 6 weeks.   Outpatient follow up:2 weeks  Postpartum contraception: Progesterone only pills  Newborn Data: Live born female  Birth Weight: 5 lb 7.1 oz (2470 g) APGAR: 8, 9  Baby Feeding: Bottle and Breast Disposition:home with mother   07/23/2015 Caren Macadam, MD

## 2015-07-26 ENCOUNTER — Encounter: Payer: Medicaid Other | Admitting: Obstetrics and Gynecology

## 2015-07-26 ENCOUNTER — Ambulatory Visit (INDEPENDENT_AMBULATORY_CARE_PROVIDER_SITE_OTHER): Payer: Medicaid Other | Admitting: Obstetrics and Gynecology

## 2015-07-26 ENCOUNTER — Encounter: Payer: Self-pay | Admitting: Obstetrics and Gynecology

## 2015-07-26 DIAGNOSIS — Z9889 Other specified postprocedural states: Secondary | ICD-10-CM

## 2015-07-26 MED ORDER — HYDROCHLOROTHIAZIDE 25 MG PO TABS: 25.0000 mg | ORAL_TABLET | Freq: Every day | ORAL | Status: DC

## 2015-07-26 NOTE — Progress Notes (Signed)
Patient ID: Sarah Cross, female   DOB: 1981-05-30, 34 y.o.   MRN: [id number]   Subjective:  Sarah Cross is a 34 y.o. female now 1 weeks status post cesaerean section.   Pt here today for post op visit. Pt states that she is in a lot of pain and has a lot of swelling. Pt states that the percocet is not helping and she needs something for fluid  Review of Systems Negative except for pain at the incision site with associated swelling. She also states she needs something for fluid in her feet. She is breastfeeding and bottle feeding.   Diet:   normal   Bowel movements : normal.  Pain is not well controlled.  Medications being used: Percocet.  Objective:  LMP 11/26/2014 General:Well developed, well nourished.  No acute distress. Abdomen: Bowel sounds normal, soft, tender Pelvic Exam: not done Incision(s):   Healing well, no drainage, no erythema, no hernia, no swelling, no dehiscence,  legs 4+ edema of lower legs and feet   Assessment:  Post-Op 1 weeks s/p cesaerean section by Faculty practice  After srom at 37+ wk   Doing well postoperatively.   Plan:  Routine Post Op Will give her HCTZ 25mg  x 7 days  By signing my name below, I, Erling Conte, attest that this documentation has been prepared under the direction and in the presence of Jonnie Kind, MD. Electronically Signed: Erling Conte, ED Scribe. 07/26/2015. 9:45 AM.  I personally performed the services described in this documentation, which was SCRIBED in my presence. The recorded information has been reviewed and considered accurate. It has been edited as necessary during review. Jonnie Kind, MD

## 2015-07-26 NOTE — Progress Notes (Signed)
Patient ID: Sarah Cross, female   DOB: [birth year], 34 y.o.   MRN: [id number] Pt here today for post op visit. Pt states that she is in a lot of pain and has a lot of swelling. Pt states that the percocet is not helping and she needs something for fluid.

## 2015-07-29 ENCOUNTER — Telehealth: Payer: Self-pay | Admitting: Obstetrics and Gynecology

## 2015-07-29 NOTE — Telephone Encounter (Signed)
Pt states was taken Lovenox injections and still has refills at the pharmacy. Does pt need to continue injections?

## 2015-07-29 NOTE — Telephone Encounter (Signed)
Left Msg for pt to continue med Lovenox x 4wk pp. 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 Pt to let us know the dose, of her Rx, as it is not in EPIC>

## 2015-07-30 ENCOUNTER — Telehealth: Payer: Self-pay | Admitting: Obstetrics and Gynecology

## 2015-07-30 NOTE — Telephone Encounter (Signed)
Take lovenox for 6 weeks.

## 2015-07-31 ENCOUNTER — Inpatient Hospital Stay (HOSPITAL_COMMUNITY): Admit: 2015-07-31 | Payer: Medicaid Other | Admitting: Obstetrics and Gynecology

## 2015-07-31 NOTE — Telephone Encounter (Signed)
Pt aware to continue to take Lovenox for 6 weeks and follow up at her Postpartum visit.

## 2015-08-14 ENCOUNTER — Telehealth: Payer: Self-pay | Admitting: *Deleted

## 2015-08-14 NOTE — Telephone Encounter (Signed)
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 Pt informed will not need to continue the Acyclovir post delivery as long as she is not having a actual outbreak. Pt asked is Acyclovir safe to take while breastfeeding? Pt informed per Derrek Monaco, NP, Acyclovir is safe to take while breastfeeding.

## 2015-08-22 ENCOUNTER — Encounter: Payer: Self-pay | Admitting: Advanced Practice Midwife

## 2015-08-22 ENCOUNTER — Ambulatory Visit: Payer: Medicaid Other | Admitting: Advanced Practice Midwife

## 2015-08-22 ENCOUNTER — Ambulatory Visit (INDEPENDENT_AMBULATORY_CARE_PROVIDER_SITE_OTHER): Payer: Medicaid Other | Admitting: Advanced Practice Midwife

## 2015-08-22 VITALS — BP 132/82 | HR 56 | Ht 62.0 in | Wt 156.0 lb

## 2015-08-22 DIAGNOSIS — Z3202 Encounter for pregnancy test, result negative: Secondary | ICD-10-CM | POA: Diagnosis not present

## 2015-08-22 LAB — POCT URINE PREGNANCY: Preg Test, Ur: NEGATIVE

## 2015-08-22 MED ORDER — NORETHINDRONE 0.35 MG PO TABS: 1.0000 | ORAL_TABLET | Freq: Every day | ORAL | Status: DC

## 2015-08-22 MED ORDER — ENOXAPARIN SODIUM 40 MG/0.4ML ~~LOC~~ SOLN: 40.0000 mg | SUBCUTANEOUS | Status: AC

## 2015-08-22 NOTE — Progress Notes (Signed)
  Sarah Cross is a 34 y.o. who presents for a postpartum visit. She is 4 weeks postpartum following a low cervical transverse Cesarean section. I have fully reviewed the prenatal and intrapartum course. The delivery was at 37.5 gestational weeks. She had SROM and elective repeat CS. Anesthesia: spinal. Postpartum course has been uneventful. Baby's course has been uneventful. Baby is feeding by breast. Bleeding: staining only. Bowel function is normal. Bladder function is normal. Patient is not sexually active. Contraception method is oral progesterone-only contraceptive. Postpartum depression screening: negative.   Current outpatient prescriptions:  .  acetaminophen (TYLENOL) 500 MG tablet, Take 500 mg by mouth daily as needed for moderate pain. , Disp: , Rfl:  .  docusate sodium (COLACE) 100 MG capsule, Take 100-300 mg by mouth daily as needed for mild constipation., Disp: , Rfl:  .  omeprazole (PRILOSEC) 20 MG capsule, Take 1 capsule (20 mg total) by mouth daily., Disp: 30 capsule, Rfl: 0 .  Prenatal Vit-Fe Fumarate-FA (PRENATAL MULTIVITAMIN) TABS tablet, Take 1 tablet by mouth daily at 12 noon., Disp: , Rfl:  .  enoxaparin (LOVENOX) 40 MG/0.4ML injection, Inject 0.4 mLs (40 mg total) into the skin daily., Disp: 16.8 mL, Rfl: 0 .  norethindrone (MICRONOR,CAMILA,ERRIN) 0.35 MG tablet, Take 1 tablet (0.35 mg total) by mouth daily. Start on the Sunday after the baby turns 54 weeks old., Disp: 1 Package, Rfl: 11  Review of Systems   Constitutional: Negative for fever and chills Eyes: Negative for visual disturbances Respiratory: Negative for shortness of breath, dyspnea Cardiovascular: Negative for chest pain or palpitations  Gastrointestinal: Negative for vomiting, diarrhea and constipation Genitourinary: Negative for dysuria and urgency Musculoskeletal: Negative for back pain, joint pain, myalgias  Neurological: Negative for dizziness and headaches   Objective:     Filed Vitals:   08/22/15 0902  BP: 132/82  Pulse: 56   General:  alert, cooperative and no distress   Breasts:  negative  Lungs: clear to auscultation bilaterally  Heart:  regular rate and rhythm  Abdomen: Soft, nontender, well healed   Vulva:  normal  Vagina: normal vagina  Cervix:  closed  Corpus: Well involuted     Rectal Exam: no hemorrhoids        Assessment:    normal postpartum exam.  Plan:    1. Contraception: oral progesterone-only contraceptive  --start when baby is 45 weeks old 2.  Continue lovenox until baby is 51 weeks old         2. Follow up in:   or as needed.

## 2015-10-11 ENCOUNTER — Telehealth: Payer: Self-pay | Admitting: *Deleted

## 2015-10-11 NOTE — Telephone Encounter (Signed)
Pt states saw Manus Gunning on 11/10 for her postpartum visits and was given a note to return to work on 09/16/2015 from [facility]. Pt states she lost the work note, can she get another one.  Pt informed note completed and left at front desk for pick up.

## 2015-11-26 ENCOUNTER — Other Ambulatory Visit: Payer: Self-pay | Admitting: *Deleted

## 2015-11-26 MED ORDER — ACYCLOVIR 400 MG PO TABS: 400.0000 mg | ORAL_TABLET | Freq: Two times a day (BID) | ORAL | Status: DC

## 2016-08-04 ENCOUNTER — Ambulatory Visit: Payer: Medicaid Other | Admitting: Advanced Practice Midwife

## 2016-08-17 ENCOUNTER — Other Ambulatory Visit: Payer: Medicaid Other | Admitting: Adult Health

## 2017-06-26 ENCOUNTER — Emergency Department (HOSPITAL_COMMUNITY): Payer: Self-pay

## 2017-06-26 ENCOUNTER — Encounter (HOSPITAL_COMMUNITY): Payer: Self-pay | Admitting: Emergency Medicine

## 2017-06-26 ENCOUNTER — Emergency Department (HOSPITAL_COMMUNITY)
Admission: EM | Admit: 2017-06-26 | Discharge: 2017-06-26 | Disposition: A | Discharge status: Home / Self Care | Payer: Self-pay | Attending: Emergency Medicine | Admitting: Emergency Medicine

## 2017-06-26 DIAGNOSIS — Z91040 Latex allergy status: Secondary | ICD-10-CM | POA: Insufficient documentation

## 2017-06-26 DIAGNOSIS — R1010 Upper abdominal pain, unspecified: Secondary | ICD-10-CM | POA: Insufficient documentation

## 2017-06-26 LAB — CBC WITH DIFFERENTIAL/PLATELET
BASOS PCT: 0 %
Basophils Absolute: 0 10*3/uL (ref 0.0–0.1)
Eosinophils Absolute: 0.1 10*3/uL (ref 0.0–0.7)
Eosinophils Relative: 1 %
HEMATOCRIT: 43.6 % (ref 36.0–46.0)
HEMOGLOBIN: 14.9 g/dL (ref 12.0–15.0)
LYMPHS PCT: 37 %
Lymphs Abs: 3.1 10*3/uL (ref 0.7–4.0)
MCH: 28.1 pg (ref 26.0–34.0)
MCHC: 34.2 g/dL (ref 30.0–36.0)
MCV: 82.1 fL (ref 78.0–100.0)
MONO ABS: 0.5 10*3/uL (ref 0.1–1.0)
MONOS PCT: 5 %
NEUTROS ABS: 4.7 10*3/uL (ref 1.7–7.7)
NEUTROS PCT: 57 %
Platelets: 288 10*3/uL (ref 150–400)
RBC: 5.31 MIL/uL — ABNORMAL HIGH (ref 3.87–5.11)
RDW: 14.8 % (ref 11.5–15.5)
WBC: 8.4 10*3/uL (ref 4.0–10.5)

## 2017-06-26 LAB — COMPREHENSIVE METABOLIC PANEL
ALBUMIN: 4.6 g/dL (ref 3.5–5.0)
ALK PHOS: 71 U/L (ref 38–126)
ALT: 17 U/L (ref 14–54)
ANION GAP: 9 (ref 5–15)
AST: 22 U/L (ref 15–41)
BILIRUBIN TOTAL: 0.7 mg/dL (ref 0.3–1.2)
BUN: 8 mg/dL (ref 6–20)
CHLORIDE: 105 mmol/L (ref 101–111)
CO2: 27 mmol/L (ref 22–32)
CREATININE: 0.69 mg/dL (ref 0.44–1.00)
Calcium: 9.2 mg/dL (ref 8.9–10.3)
GFR calc Af Amer: >60 mL/min (ref >60)
Glucose, Bld: 84 mg/dL (ref 65–99)
POTASSIUM: 3.5 mmol/L (ref 3.5–5.1)
Sodium: 141 mmol/L (ref 135–145)
Total Protein: 8.6 g/dL — ABNORMAL HIGH (ref 6.5–8.1)

## 2017-06-26 LAB — I-STAT BETA HCG BLOOD, ED (MC, WL, AP ONLY)

## 2017-06-26 LAB — URINALYSIS, ROUTINE W REFLEX MICROSCOPIC
BILIRUBIN URINE: NEGATIVE
Glucose, UA: NEGATIVE mg/dL
Hgb urine dipstick: NEGATIVE
KETONES UR: NEGATIVE mg/dL
Leukocytes, UA: NEGATIVE
NITRITE: NEGATIVE
PH: 7 (ref 5.0–8.0)
Protein, ur: NEGATIVE mg/dL
Specific Gravity, Urine: 1.009 (ref 1.005–1.030)

## 2017-06-26 LAB — LIPASE, BLOOD: Lipase: 27 U/L (ref 11–51)

## 2017-06-26 MED ORDER — ONDANSETRON HCL 4 MG/2ML IJ SOLN
4.0000 mg | Freq: Once | INTRAMUSCULAR | Status: AC
  Administered 2017-06-26: 4 mg via INTRAVENOUS
  Filled 2017-06-26: qty 2

## 2017-06-26 MED ORDER — HYDROMORPHONE HCL 1 MG/ML IJ SOLN
1.0000 mg | Freq: Once | INTRAMUSCULAR | Status: AC
  Administered 2017-06-26: 1 mg via INTRAVENOUS
  Filled 2017-06-26: qty 1

## 2017-06-26 MED ORDER — TRAMADOL HCL 50 MG PO TABS: 50.0000 mg | ORAL_TABLET | Freq: Four times a day (QID) | ORAL | 0 refills | Status: DC | PRN

## 2017-06-26 MED ORDER — RANITIDINE HCL 150 MG PO TABS: 150.0000 mg | ORAL_TABLET | Freq: Two times a day (BID) | ORAL | 0 refills | Status: DC

## 2017-06-26 MED ORDER — SODIUM CHLORIDE 0.9 % IV BOLUS (SEPSIS)
1000.0000 mL | Freq: Once | INTRAVENOUS | Status: AC
  Administered 2017-06-26: 1000 mL via INTRAVENOUS

## 2017-06-26 MED ORDER — IOPAMIDOL (ISOVUE-300) INJECTION 61%
100.0000 mL | Freq: Once | INTRAVENOUS | Status: AC | PRN
  Administered 2017-06-26: 100 mL via INTRAVENOUS

## 2017-06-26 NOTE — Discharge Instructions (Signed)
Follow-up with Dr. Arnoldo Morale to check out your umbilicus. Follow-up with your family doctor if not improving

## 2017-06-26 NOTE — ED Triage Notes (Signed)
Pt c/o of mid ABD pain for a month. No N/V/D or fevers. Last bowel movement today.

## 2017-06-26 NOTE — ED Provider Notes (Signed)
[place] DEPT Provider Note   CSN: [id number] Arrival date & time: 06/26/17  1639     History   Chief Complaint Chief Complaint  Patient presents with  . Abdominal Pain    HPI Sarah Cross is a 36 y.o. female.  Patient complains of epigastric abdominal pain   The history is provided by the patient.  Abdominal Pain   This is a recurrent problem. The current episode started more than 1 week ago. The problem occurs daily. The problem has not changed since onset.The pain is associated with an unknown factor. The pain is located in the generalized abdominal region. The quality of the pain is aching. The pain is at a severity of 3/10. The pain is moderate. Pertinent negatives include diarrhea, frequency, hematuria and headaches.    Past Medical History:  Diagnosis Date  . Abnormal Pap smear   . Endometriosis   . History of DVT of lower extremity 2008   1 week after bladder surgery  . 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 Nausea 12/26/2014  . Pregnant 12/26/2014  . Sickle cell anemia (HCC)    has trait  . Spotting during pregnancy in first trimester 12/26/2014  . Vaginal Pap smear, abnormal     Patient Active Problem List   Diagnosis Date Noted  . History of DVT of lower extremity   . 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 Ovarian cyst, left 01/28/2015  . Gonorrhea affecting pregnancy in first trimester, antepartum 01/19/2015  . Sickle cell trait ([place]) 01/19/2015  . H/O cesarean section complicating pregnancy 85/11/7739  . Nausea 12/26/2014  . 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 Dysplasia of cervix, unspecified 04/07/2013  . 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 Endometriosis 02/13/2013    Past Surgical History:  Procedure Laterality Date  . BLADDER TUMOR EXCISION    . CESAREAN SECTION    . CESAREAN SECTION N/A 07/20/2015   Procedure: REPEAT CESAREAN SECTION;  Surgeon: Jonnie Kind,  MD;  Location: [facility];  Service: Obstetrics;  Laterality: N/A;  . CSF SHUNT      OB History    Gravida Para Term Preterm AB Living   2 2 1 1   2    SAB TAB Ectopic Multiple Live Births         0 2       Home Medications    Prior to Admission medications   Medication Sig Start Date End Date Taking? Authorizing Provider  ranitidine (ZANTAC) 150 MG tablet Take 1 tablet (150 mg total) by mouth 2 (two) times daily. 06/26/17   Milton Ferguson, MD  traMADol (ULTRAM) 50 MG tablet Take 1 tablet (50 mg total) by mouth every 6 (six) hours as needed. 06/26/17   Milton Ferguson, MD    Family History Family History  Problem Relation Age of Onset  . Diabetes Mother   . Cancer Mother        lung  . Hypertension Father   . Heart disease Maternal Grandmother   . Diabetes Maternal Grandmother   . Heart disease Paternal Grandmother   . Diabetes Paternal Grandmother   . Cancer Paternal Grandfather   . Diabetes Sister   . Heart disease Sister   . Thyroid disease Sister     Social History Social History  Substance Use Topics  . Smoking status: Never Smoker  . Smokeless tobacco: Never Used  . Alcohol use No  Allergies   Latex   Review of Systems Review of Systems  Constitutional: Negative for appetite change and fatigue.  HENT: Negative for congestion, ear discharge and sinus pressure.   Eyes: Negative for discharge.  Respiratory: Negative for cough.   Cardiovascular: Negative for chest pain.  Gastrointestinal: Positive for abdominal pain. Negative for diarrhea.  Genitourinary: Negative for frequency and hematuria.  Musculoskeletal: Negative for back pain.  Skin: Negative for rash.  Neurological: Negative for seizures and headaches.  Psychiatric/Behavioral: Negative for hallucinations.     Physical Exam Updated Vital Signs BP (!) 144/78   Pulse (!) 53   Temp 98.2 F (36.8 C) (Oral)   Resp 18   Ht 5\' 2"  (1.575 m)   Wt 71.2 kg (157 lb)   LMP 06/21/2017   SpO2 98%    BMI 28.72 kg/m   Physical Exam  Constitutional: She is oriented to person, place, and time. She appears well-developed.  HENT:  Head: Normocephalic.  Eyes: Conjunctivae and EOM are normal. No scleral icterus.  Neck: Neck supple. No thyromegaly present.  Cardiovascular: Normal rate and regular rhythm.  Exam reveals no gallop and no friction rub.   No murmur heard. Pulmonary/Chest: No stridor. She has no wheezes. She has no rales. She exhibits no tenderness.  Abdominal: She exhibits no distension. There is tenderness. There is no rebound.  Patient has a tender epigastric area and also very tender umbilicus  Musculoskeletal: Normal range of motion. She exhibits no edema.  Lymphadenopathy:    She has no cervical adenopathy.  Neurological: She is oriented to person, place, and time. She exhibits normal muscle tone. Coordination normal.  Skin: No rash noted. No erythema.  Psychiatric: She has a normal mood and affect. Her behavior is normal.     ED Treatments / Results  Labs (all labs ordered are listed, but only abnormal results are displayed) Labs Reviewed  CBC WITH DIFFERENTIAL/PLATELET - Abnormal; Notable for the following:       Result Value   RBC 5.31 (*)    All other components within normal limits  COMPREHENSIVE METABOLIC PANEL - Abnormal; Notable for the following:    Total Protein 8.6 (*)    All other components within normal limits  URINALYSIS, ROUTINE W REFLEX MICROSCOPIC - Abnormal; Notable for the following:    Color, Urine STRAW (*)    All other components within normal limits  LIPASE, BLOOD  I-STAT BETA HCG BLOOD, ED ([facility], [facility], [facility] ONLY)    EKG  EKG Interpretation None       Radiology Ct Abdomen Pelvis W Contrast  Result Date: 06/26/2017 CLINICAL DATA:  Abdominal pain EXAM: CT ABDOMEN AND PELVIS WITH CONTRAST TECHNIQUE: Multidetector CT imaging of the abdomen and pelvis was performed using the standard protocol following bolus administration of intravenous  contrast. CONTRAST:  134mL ISOVUE-300 IOPAMIDOL (ISOVUE-300) INJECTION 61% COMPARISON:  05/22/2007 FINDINGS: Lower chest: Subsegmental atelectasis identified in the lung bases. Hepatobiliary: There are few scattered low-attenuation foci within the liver which are too small to reliably characterize. The largest has a mean diameter of 8 mm. Gallbladder appears normal. Pancreas: Unremarkable. No pancreatic ductal dilatation or surrounding inflammatory changes. Spleen: The spleen appears normal. Adrenals/Urinary Tract: The adrenal glands are normal. Several left kidney cysts. No kidney mass or hydronephrosis. There is mild focal area of increased soft tissue within the left posterior bladder base measuring 3 x 1.1 cm. This is in the region of previous tumor resection. Stomach/Bowel: The stomach is normal. The small bowel loops  have a normal course and caliber. No obstruction. The appendix is visualized and appears normal. Unremarkable appearance of the colon. Vascular/Lymphatic: Normal appearance of the abdominal aorta. No aneurysm. The inferior vena cava appears atretic below level of the kidneys and there are extensive varicosities identified extending into the pelvis and lower extremities. 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 Reproductive: Uterus and bilateral adnexa are unremarkable. Other: There is a right-sided ventricular lobe peritoneal shunt catheter which crosses the midline. The tip terminates adjacent to the greater curvature of the stomach. A small amount of fluid around the spleen is again noted. Likely related to shunt catheter. Musculoskeletal: No acute or significant osseous findings. IMPRESSION: 1. No acute findings identified within the abdomen or pelvis. 2. 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 3. Ventriculoperitoneal shunt tubing noted. Electronically Signed   By: Kerby Moors M.D.   On: 06/26/2017 19:41     Procedures Procedures (including critical care time)  Medications Ordered in ED Medications  HYDROmorphone (DILAUDID) injection 1 mg (1 mg Intravenous Given 06/26/17 1807)  ondansetron (ZOFRAN) injection 4 mg (4 mg Intravenous Given 06/26/17 1807)  sodium chloride 0.9 % bolus 1,000 mL (1,000 mLs Intravenous New Bag/Given 06/26/17 1807)  iopamidol (ISOVUE-300) 61 % injection 100 mL (100 mLs Intravenous Contrast Given 06/26/17 1913)     Initial Impression / Assessment and Plan / ED Course  I have reviewed the triage vital signs and the nursing notes.  Pertinent labs & imaging results that were available during my care of the patient were reviewed by me and considered in my medical decision making (see chart for details).     lAbs and CT scan unremarkable.  Patient will be placed on protonic given pain medicine and referred to surgery to check out her umbilicus I suspect there is a small hernia and follow-up with her family doctor  Final Clinical Impressions(s) / ED Diagnoses   Final diagnoses:  Pain of upper abdomen    New Prescriptions New Prescriptions   RANITIDINE (ZANTAC) 150 MG TABLET    Take 1 tablet (150 mg total) by mouth 2 (two) times daily.   TRAMADOL (ULTRAM) 50 MG TABLET    Take 1 tablet (50 mg total) by mouth every 6 (six) hours as needed.     Milton Ferguson, MD 06/26/17 2025

## 2018-04-07 ENCOUNTER — Other Ambulatory Visit: Payer: Self-pay | Admitting: Adult Health

## 2018-04-13 ENCOUNTER — Other Ambulatory Visit: Payer: Self-pay | Admitting: Adult Health

## 2018-06-16 ENCOUNTER — Other Ambulatory Visit: Payer: Self-pay | Admitting: Adult Health

## 2018-07-26 ENCOUNTER — Other Ambulatory Visit: Payer: Self-pay | Admitting: Adult Health

## 2018-08-05 ENCOUNTER — Ambulatory Visit (INDEPENDENT_AMBULATORY_CARE_PROVIDER_SITE_OTHER): Payer: Medicaid Other | Admitting: Adult Health

## 2018-08-05 ENCOUNTER — Encounter: Payer: Self-pay | Admitting: Adult Health

## 2018-08-05 VITALS — BP 154/85 | HR 59 | Ht 61.0 in | Wt 157.5 lb

## 2018-08-05 DIAGNOSIS — D492 Neoplasm of unspecified behavior of bone, soft tissue, and skin: Secondary | ICD-10-CM

## 2018-08-05 DIAGNOSIS — Z0000 Encounter for general adult medical examination without abnormal findings: Secondary | ICD-10-CM

## 2018-08-05 DIAGNOSIS — Z01419 Encounter for gynecological examination (general) (routine) without abnormal findings: Secondary | ICD-10-CM

## 2018-08-05 DIAGNOSIS — Z124 Encounter for screening for malignant neoplasm of cervix: Secondary | ICD-10-CM

## 2018-08-05 DIAGNOSIS — Z3202 Encounter for pregnancy test, result negative: Secondary | ICD-10-CM | POA: Diagnosis not present

## 2018-08-05 DIAGNOSIS — N921 Excessive and frequent menstruation with irregular cycle: Secondary | ICD-10-CM

## 2018-08-05 LAB — POCT URINE PREGNANCY: Preg Test, Ur: NEGATIVE

## 2018-08-05 NOTE — Progress Notes (Signed)
Patient ID: Sarah Cross, female   DOB: 1981/08/07, 37 y.o.   MRN: [id number] History of Present Illness: Sarah Cross is a 37 year old black female in for a well woman gyn exam and pap.She hopes to graduate from [employer] in May.  PCP is Dr Hilma Favors.   Current Medications, Allergies, Past Medical History, Past Surgical History, Family History and Social History were reviewed in [employer] record.     Review of Systems:  Patient denies any headaches, hearing loss, fatigue, blurred vision, shortness of breath, chest pain, abdominal pain, problems with bowel movements, urination, or intercourse. No joint pain or mood swings. Periods heavy at times and irregular, PMP was changing every 2 hours.   Physical Exam:BP (!) 154/85 (BP Location: Right Arm, Patient Position: Sitting, Cuff Size: Normal)   Pulse (!) 59   Ht 5\' 1"  (1.549 m)   Wt 157 lb 8 oz (71.4 kg)   LMP 07/28/2018 (Approximate)   Breastfeeding? No   BMI 29.76 kg/m UPT negative. General:  Well developed, well nourished, no acute distress Skin:  Warm and dry Neck:  Midline trachea, normal thyroid, good ROM, no lymphadenopathy Lungs; Clear to auscultation bilaterally Breast:  No dominant palpable mass, retraction, or nipple discharge Cardiovascular: Regular rate and rhythm Abdomen:  Soft, non tender, no hepatosplenomegaly, has abnormal growth of umbilicus, like keloid, no surgery or piercing, happed after last baby, Dr Elonda Husky to co examine. Pelvic:  External genitalia is normal in appearance, no lesions.  The vagina is normal in appearance. Urethra has no lesions or masses. The cervix is bulbous. Pap with GC/CHL and HPV performed. Uterus is felt to be normal size, shape, and contour.  No adnexal masses or tenderness noted.Bladder is non tender, no masses felt. Extremities/musculoskeletal:  No swelling or varicosities noted, no clubbing or cyanosis Psych:  No mood changes, alert and cooperative,seems happy PHQ 2 score  0. Examination chaperoned by Sarah Hotter, RN.  Impression: 1. Encounter for gynecological examination with Papanicolaou smear of cervix   2. Routine cervical smear   3. Pregnancy examination or test, negative result   4. Menorrhagia with irregular cycle   5. Abnormal skin growth       Plan: Referred to Dr Arnoldo Morale to evaluate abnormal growth of umbilicus  Return in 1 week for GYN [place] and see me 2-3 days later Physical in 1 year Pap in 3 if normal Labs with PCP Mammogram at 38

## 2018-08-10 LAB — CYTOLOGY - PAP
Adequacy: ABSENT
Chlamydia: NEGATIVE
Diagnosis: NEGATIVE
HPV: NOT DETECTED
Neisseria Gonorrhea: NEGATIVE

## 2018-08-12 ENCOUNTER — Other Ambulatory Visit: Payer: Medicaid Other

## 2018-08-15 ENCOUNTER — Ambulatory Visit: Payer: Medicaid Other | Admitting: Adult Health

## 2018-09-02 ENCOUNTER — Ambulatory Visit: Payer: Medicaid Other | Admitting: Adult Health

## 2018-09-06 ENCOUNTER — Ambulatory Visit: Payer: Medicaid Other | Admitting: General Surgery

## 2018-09-07 ENCOUNTER — Telehealth: Payer: Self-pay | Admitting: *Deleted

## 2018-09-12 ENCOUNTER — Telehealth: Payer: Self-pay | Admitting: *Deleted

## 2018-09-12 NOTE — Telephone Encounter (Signed)
Left message that she no showed for appt with Dr Arnoldo Morale, if wants to be referred again will have to redo as they cancelled refral.

## 2018-09-12 NOTE — Telephone Encounter (Signed)
Pt was a no show at Dr. Arnoldo Morale office.  We referred pt.  09-12-18  AS

## 2019-10-25 ENCOUNTER — Ambulatory Visit: Payer: Medicaid Other | Attending: Internal Medicine

## 2019-10-25 ENCOUNTER — Other Ambulatory Visit: Payer: Self-pay

## 2019-10-25 DIAGNOSIS — Z20822 Contact with and (suspected) exposure to covid-19: Secondary | ICD-10-CM

## 2019-10-26 LAB — NOVEL CORONAVIRUS, NAA: SARS-CoV-2, NAA: NOT DETECTED

## 2019-10-27 ENCOUNTER — Ambulatory Visit: Payer: Self-pay

## 2019-10-27 NOTE — Telephone Encounter (Signed)
Provided Patient with covid test results Voiced understanding.

## 2020-07-16 ENCOUNTER — Encounter: Payer: Self-pay | Admitting: Emergency Medicine

## 2020-07-16 ENCOUNTER — Ambulatory Visit
Admission: EM | Admit: 2020-07-16 | Discharge: 2020-07-16 | Disposition: A | Discharge status: Home / Self Care | Payer: Medicaid Other

## 2020-07-16 ENCOUNTER — Other Ambulatory Visit: Payer: Self-pay

## 2020-07-16 DIAGNOSIS — R42 Dizziness and giddiness: Secondary | ICD-10-CM

## 2020-07-16 NOTE — ED Triage Notes (Signed)
Pt states she had a dizzy episode yesterday , the room started to spin and she fell down.  States she didn't loose conscious. Then she had a headache immediately after. Also states her arms have been feeling heavy.

## 2020-07-16 NOTE — Discharge Instructions (Addendum)
  Orthostatic blood pressure was inconclusive Advised patient to increase fluid intake To follow-up with PCP Return or go to ED for worsening of symptoms

## 2020-07-16 NOTE — ED Provider Notes (Signed)
[place]   [id number] 07/16/20 Arrival Time: 1924  [id number]  SUBJECTIVE:  Sarah Cross is a 39 y.o. female who presents with complaint of dizziness that began yesterday.  Denies a precipitating event, trauma, or recent URI within the past month.  Describes the dizziness as "the room spinning".  States that it is constant with rapid movement and lasted few seconds. Reports she has dry mouth after and mild headache. Has not tried any OTC medication. Reports symptom has resolved today.   Denies  similar symptoms in the past.  Denies fever, chills, nausea, vomiting, hearing changes, tinnitus, ear pain, chest pain, syncope, SOB, weakness, slurred speech, memory or emotional changes, facial drooping/ asymmetry, incoordination, numbness or tingling, abdominal pain, changes in bowel or bladder habits.      ROS: As per HPI.  All other pertinent ROS negative.    Past Medical History:  Diagnosis Date  . Abnormal Pap smear   . Endometriosis   . History of DVT of lower extremity 2008   1 week after bladder surgery  . 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 Hydrocephalus in newborn [facility])   . Nausea 12/26/2014  . Pregnant 12/26/2014  . Sickle cell anemia (HCC)    has trait  . Spotting during pregnancy in first trimester 12/26/2014  . Vaginal Pap smear, abnormal    Past Surgical History:  Procedure Laterality Date  . BLADDER TUMOR EXCISION    . CESAREAN SECTION    . CESAREAN SECTION N/A 07/20/2015   Procedure: REPEAT CESAREAN SECTION;  Surgeon: Jonnie Kind, MD;  Location: [facility];  Service: Obstetrics;  Laterality: N/A;  . CSF SHUNT     Allergies  Allergen Reactions  . Latex Itching and Other (See Comments)    Burns hands   No current facility-administered medications on file prior to encounter.   Current Outpatient Medications on File Prior to Encounter  Medication Sig Dispense Refill  . traMADol (ULTRAM) 50 MG tablet Take 1 tablet (50 mg total) by  mouth every 6 (six) hours as needed. 20 tablet 0   Social History   Socioeconomic History  . Marital status: Divorced    Spouse name: Not on file  . Number of children: Not on file  . Years of education: Not on file  . Highest education level: Not on file  Occupational History  . Not on file  Tobacco Use  . Smoking status: Never Smoker  . Smokeless tobacco: Never Used  Vaping Use  . Vaping Use: Never used  Substance and Sexual Activity  . Alcohol use: No  . Drug use: No  . Sexual activity: Not Currently    Birth control/protection: None  Other Topics Concern  . Not on file  Social History Narrative  . Not on file   Social Determinants of Health   Financial Resource Strain:   . Difficulty of Paying Living Expenses: Not on file  Food Insecurity:   . Worried About Charity fundraiser in the Last Year: Not on file  . Ran Out of Food in the Last Year: Not on file  Transportation Needs:   . Lack of Transportation (Medical): Not on file  . Lack of Transportation (Non-Medical): Not on file  Physical Activity:   . Days of Exercise per Week: Not on file  . Minutes of Exercise per Session: Not on file  Stress:   . Feeling of Stress : Not on file  Social Connections:   .  Frequency of Communication with Friends and Family: Not on file  . Frequency of Social Gatherings with Friends and Family: Not on file  . Attends Religious Services: Not on file  . Active Member of Clubs or Organizations: Not on file  . Attends Archivist Meetings: Not on file  . Marital Status: Not on file  Intimate Partner Violence:   . Fear of Current or Ex-Partner: Not on file  . Emotionally Abused: Not on file  . Physically Abused: Not on file  . Sexually Abused: Not on file   Family History  Problem Relation Age of Onset  . Diabetes Mother   . Cancer Mother        lung  . Hypertension Father   . Heart disease Maternal Grandmother   . Diabetes Maternal Grandmother   . Heart disease  Paternal Grandmother   . Diabetes Paternal Grandmother   . Cancer Paternal Grandfather   . Diabetes Sister   . Heart disease Sister   . Thyroid disease Sister   . Diabetes Sister     OBJECTIVE:  Vitals:   07/16/20 1937  BP: (!) 173/94  Pulse: 79  Resp: 19  Temp: 98.4 F (36.9 C)  TempSrc: Oral  SpO2: 98%  Weight: 160 lb (72.6 kg)  Height: 5\' 2"  (1.575 m)    General appearance: alert; no distress Eyes: PERRLA; EOMI; conjunctiva normal HENT: normocephalic; atraumatic; TMs normal; nasal mucosa normal; oral mucosa normal Neck: supple with FROM Lungs: clear to auscultation bilaterally Heart: regular rate and rhythm Abdomen: soft, non-tender; bowel sounds normal Extremities: no cyanosis or edema; symmetrical with no gross deformities Skin: warm and dry Neurologic: normal gait; normal symmetric reflexes; CN 2-12 grossly intact Psychological: alert and cooperative; normal mood and affect  Labs:  No results found for this or any previous visit (from the past 24 hour(s)). Orders placed or performed during the hospital encounter of 10/03/13  . EKG 12-Lead  . EKG 12-Lead  . EKG    No results found.  ASSESSMENT & PLAN:  No diagnosis found.  No orders of the defined types were placed in this encounter.  Patient is stable at discharge. Symptom is likely from orthostatic as she stated she was dizzy right after sudden movement. Orthostatic blood pressure was inconclusive.   Discharge instruction  Orthostatic blood pressure was inconclusive Advised patient to increase fluid intake To follow-up with PCP Return or go to ED for worsening of symptoms  Reviewed expectations re: course of current medical issues. Questions answered. Outlined signs and symptoms indicating need for more acute intervention. Patient verbalized understanding. After Visit Summary given.    Emerson Monte, FNP 07/16/20 2016

## 2020-08-07 ENCOUNTER — Encounter: Payer: Self-pay | Admitting: Adult Health

## 2020-08-07 ENCOUNTER — Ambulatory Visit (INDEPENDENT_AMBULATORY_CARE_PROVIDER_SITE_OTHER): Payer: Medicaid Other | Admitting: Adult Health

## 2020-08-07 ENCOUNTER — Other Ambulatory Visit: Payer: Self-pay

## 2020-08-07 VITALS — BP 170/102 | HR 76 | Ht 61.25 in | Wt 165.5 lb

## 2020-08-07 DIAGNOSIS — R42 Dizziness and giddiness: Secondary | ICD-10-CM | POA: Diagnosis not present

## 2020-08-07 DIAGNOSIS — Z1211 Encounter for screening for malignant neoplasm of colon: Secondary | ICD-10-CM | POA: Insufficient documentation

## 2020-08-07 DIAGNOSIS — I10 Essential (primary) hypertension: Secondary | ICD-10-CM | POA: Insufficient documentation

## 2020-08-07 DIAGNOSIS — Z01419 Encounter for gynecological examination (general) (routine) without abnormal findings: Secondary | ICD-10-CM

## 2020-08-07 LAB — HEMOCCULT GUIAC POC 1CARD (OFFICE): Fecal Occult Blood, POC: NEGATIVE

## 2020-08-07 MED ORDER — LOSARTAN POTASSIUM 25 MG PO TABS: 25.0000 mg | ORAL_TABLET | Freq: Every day | ORAL | 2 refills | Status: DC

## 2020-08-07 NOTE — Patient Instructions (Signed)
DASH Eating Plan DASH stands for "Dietary Approaches to Stop Hypertension." The DASH eating plan is a healthy eating plan that has been shown to reduce high blood pressure (hypertension). It may also reduce your risk for type 2 diabetes, heart disease, and stroke. The DASH eating plan may also help with weight loss. What are tips for following this plan?  General guidelines  Avoid eating more than 2,300 mg (milligrams) of salt (sodium) a day. If you have hypertension, you may need to reduce your sodium intake to 1,500 mg a day.  Limit alcohol intake to no more than 1 drink a day for nonpregnant women and 2 drinks a day for men. One drink equals 12 oz of beer, 5 oz of wine, or 1 oz of hard liquor.  Work with your health care provider to maintain a healthy body weight or to lose weight. Ask what an ideal weight is for you.  Get at least 30 minutes of exercise that causes your heart to beat faster (aerobic exercise) most days of the week. Activities may include walking, swimming, or biking.  Work with your health care provider or diet and nutrition specialist (dietitian) to adjust your eating plan to your individual calorie needs. Reading food labels   Check food labels for the amount of sodium per serving. Choose foods with less than 5 percent of the Daily Value of sodium. Generally, foods with less than 300 mg of sodium per serving fit into this eating plan.  To find whole grains, look for the word "whole" as the first word in the ingredient list. Shopping  Buy products labeled as "low-sodium" or "no salt added."  Buy fresh foods. Avoid canned foods and premade or frozen meals. Cooking  Avoid adding salt when cooking. Use salt-free seasonings or herbs instead of table salt or sea salt. Check with your health care provider or pharmacist before using salt substitutes.  Do not fry foods. Cook foods using healthy methods such as baking, boiling, grilling, and broiling instead.  Cook with  heart-healthy oils, such as olive, canola, soybean, or sunflower oil. Meal planning  Eat a balanced diet that includes: ? 5 or more servings of fruits and vegetables each day. At each meal, try to fill half of your plate with fruits and vegetables. ? Up to 6-8 servings of whole grains each day. ? Less than 6 oz of lean meat, poultry, or fish each day. A 3-oz serving of meat is about the same size as a deck of cards. One egg equals 1 oz. ? 2 servings of low-fat dairy each day. ? A serving of nuts, seeds, or beans 5 times each week. ? Heart-healthy fats. Healthy fats called Omega-3 fatty acids are found in foods such as flaxseeds and coldwater fish, like sardines, salmon, and mackerel.  Limit how much you eat of the following: ? Canned or prepackaged foods. ? Food that is high in trans fat, such as fried foods. ? Food that is high in saturated fat, such as fatty meat. ? Sweets, desserts, sugary drinks, and other foods with added sugar. ? Full-fat dairy products.  Do not salt foods before eating.  Try to eat at least 2 vegetarian meals each week.  Eat more home-cooked food and less restaurant, buffet, and fast food.  When eating at a restaurant, ask that your food be prepared with less salt or no salt, if possible. What foods are recommended? The items listed may not be a complete list. Talk with your dietitian about   what dietary choices are best for you. Grains Whole-grain or whole-wheat bread. Whole-grain or whole-wheat pasta. Brown rice. Oatmeal. Quinoa. Bulgur. Whole-grain and low-sodium cereals. Pita bread. Low-fat, low-sodium crackers. Whole-wheat flour tortillas. Vegetables Fresh or frozen vegetables (raw, steamed, roasted, or grilled). Low-sodium or reduced-sodium tomato and vegetable juice. Low-sodium or reduced-sodium tomato sauce and tomato paste. Low-sodium or reduced-sodium canned vegetables. Fruits All fresh, dried, or frozen fruit. Canned fruit in natural juice (without  added sugar). Meat and other protein foods Skinless chicken or turkey. Ground chicken or turkey. Pork with fat trimmed off. Fish and seafood. Egg whites. Dried beans, peas, or lentils. Unsalted nuts, nut butters, and seeds. Unsalted canned beans. Lean cuts of beef with fat trimmed off. Low-sodium, lean deli meat. Dairy Low-fat (1%) or fat-free (skim) milk. Fat-free, low-fat, or reduced-fat cheeses. Nonfat, low-sodium ricotta or cottage cheese. Low-fat or nonfat yogurt. Low-fat, low-sodium cheese. Fats and oils Soft margarine without trans fats. Vegetable oil. Low-fat, reduced-fat, or light mayonnaise and salad dressings (reduced-sodium). Canola, safflower, olive, soybean, and sunflower oils. Avocado. Seasoning and other foods Herbs. Spices. Seasoning mixes without salt. Unsalted popcorn and pretzels. Fat-free sweets. What foods are not recommended? The items listed may not be a complete list. Talk with your dietitian about what dietary choices are best for you. Grains Baked goods made with fat, such as croissants, muffins, or some breads. Dry pasta or rice meal packs. Vegetables Creamed or fried vegetables. Vegetables in a cheese sauce. Regular canned vegetables (not low-sodium or reduced-sodium). Regular canned tomato sauce and paste (not low-sodium or reduced-sodium). Regular tomato and vegetable juice (not low-sodium or reduced-sodium). Pickles. Olives. Fruits Canned fruit in a light or heavy syrup. Fried fruit. Fruit in cream or butter sauce. Meat and other protein foods Fatty cuts of meat. Ribs. Fried meat. Bacon. Sausage. Bologna and other processed lunch meats. Salami. Fatback. Hotdogs. Bratwurst. Salted nuts and seeds. Canned beans with added salt. Canned or smoked fish. Whole eggs or egg yolks. Chicken or turkey with skin. Dairy Whole or 2% milk, cream, and half-and-half. Whole or full-fat cream cheese. Whole-fat or sweetened yogurt. Full-fat cheese. Nondairy creamers. Whipped toppings.  Processed cheese and cheese spreads. Fats and oils Butter. Stick margarine. Lard. Shortening. Ghee. Bacon fat. Tropical oils, such as coconut, palm kernel, or palm oil. Seasoning and other foods Salted popcorn and pretzels. Onion salt, garlic salt, seasoned salt, table salt, and sea salt. Worcestershire sauce. Tartar sauce. Barbecue sauce. Teriyaki sauce. Soy sauce, including reduced-sodium. Steak sauce. Canned and packaged gravies. Fish sauce. Oyster sauce. Cocktail sauce. Horseradish that you find on the shelf. Ketchup. Mustard. Meat flavorings and tenderizers. Bouillon cubes. Hot sauce and Tabasco sauce. Premade or packaged marinades. Premade or packaged taco seasonings. Relishes. Regular salad dressings. Where to find more information:  National Heart, Lung, and Blood Institute: www.nhlbi.nih.gov  American Heart Association: www.heart.org Summary  The DASH eating plan is a healthy eating plan that has been shown to reduce high blood pressure (hypertension). It may also reduce your risk for type 2 diabetes, heart disease, and stroke.  With the DASH eating plan, you should limit salt (sodium) intake to 2,300 mg a day. If you have hypertension, you may need to reduce your sodium intake to 1,500 mg a day.  When on the DASH eating plan, aim to eat more fresh fruits and vegetables, whole grains, lean proteins, low-fat dairy, and heart-healthy fats.  Work with your health care provider or diet and nutrition specialist (dietitian) to adjust your eating plan to your   individual calorie needs. This information is not intended to replace advice given to you by your health care provider. Make sure you discuss any questions you have with your health care provider. Document Revised: 09/10/2017 Document Reviewed: 09/21/2016 Elsevier Patient Education  2020 Elsevier Inc.  

## 2020-08-07 NOTE — Progress Notes (Signed)
Patient ID: [name], female   DOB: 1981/03/01, 39 y.o.   MRN: [id number] History of Present Illness: Sarah Cross is a 39 year old black female, divorced, G2P1102 in for a well woman gyn exam, she had a normal pap with negative HPV 07/26/18. She is complaining of dizziness and headache for 2 weeks and was seen at [facility] treated for vertigo, she says BP has been elevated at home. And she has noticed salty taste on tongue. PCP is [facility].   Current Medications, Allergies, Past Medical History, Past Surgical History, Family History and Social History were reviewed in [employer] record.     Review of Systems: Patient denies any hearing loss, fatigue, blurred vision, shortness of breath, chest pain, abdominal pain, problems with bowel movements, urination, or intercourse(not currently active). No joint pain or mood swings. See HP for positives.   Physical Exam:BP (!) 170/102 (BP Location: Right Arm, Patient Position: Sitting, Cuff Size: Normal)   Pulse 76   Ht 5' 1.25" (1.556 m)   Wt 165 lb 8 oz (75.1 kg)   LMP 08/02/2020   BMI 31.02 kg/m   General:  Well developed, well nourished, no acute distress Skin:  Warm and dry Neck:  Midline trachea, normal thyroid, good ROM, no lymphadenopathy Lungs; Clear to auscultation bilaterally Breast:  No dominant palpable mass, retraction, or nipple discharge Cardiovascular: Regular rate and rhythm Abdomen:  Soft, non tender, no hepatosplenomegaly, has large keloid a navel Pelvic:  External genitalia is normal in appearance, no lesions.  The vagina is normal in appearance.=period blood in vault. Urethra has no lesions or masses. The cervix is bulbous.  Uterus is felt to be normal size, shape, and contour.  No adnexal masses or tenderness noted.Bladder is non tender, no masses felt. Rectal: Good sphincter tone, no polyps, or hemorrhoids felt.  Hemoccult negative. Extremities/musculoskeletal:  No swelling or  varicosities noted, no clubbing or cyanosis Psych:  No mood changes, alert and cooperative,seems happy AA is 2 Fall risk is low PHQ 9 score is 8, no SI  [facility] - 08/07/20 1557      Pregnancy Intention Screening   Does the patient want to become pregnant in the next year? Unsure    Does the patient's partner want to become pregnant in the next year? Unsure    Would the patient like to discuss contraceptive options today? No      Contraception Wrap Up   Current Method Abstinence    End Method Abstinence    Contraception Counseling Provided No           Impression and Plan: 1. Encounter for well woman exam with routine gynecological exam Pap and physical in 1 year Mammogram in November   2. Encounter for screening fecal occult blood testing  3. Hypertension, unspecified type Will add losartan Meds ordered this encounter  Medications  . losartan (COZAAR) 25 MG tablet    Sig: Take 1 tablet (25 mg total) by mouth daily.    Dispense:  30 tablet    Refill:  2    Order Specific Question:   Supervising Provider    Answer:   Tania Ade H [[id number]]  Review DASH diet Make follow up with PCP And see me in 5 weeks   4. Dizzy spells Move slowly

## 2020-09-12 ENCOUNTER — Ambulatory Visit: Payer: Medicaid Other | Admitting: Adult Health

## 2020-10-24 ENCOUNTER — Other Ambulatory Visit: Payer: Self-pay | Admitting: Adult Health

## 2021-01-27 ENCOUNTER — Other Ambulatory Visit: Payer: Self-pay | Admitting: Adult Health

## 2021-02-08 ENCOUNTER — Other Ambulatory Visit: Payer: Self-pay | Admitting: Adult Health

## 2021-02-19 ENCOUNTER — Encounter: Payer: Self-pay | Admitting: Orthopaedic Surgery

## 2021-03-10 ENCOUNTER — Encounter: Payer: Self-pay | Admitting: Emergency Medicine

## 2021-03-10 ENCOUNTER — Ambulatory Visit
Admission: EM | Admit: 2021-03-10 | Discharge: 2021-03-10 | Disposition: A | Discharge status: Home / Self Care | Payer: Medicaid Other | Attending: Family Medicine | Admitting: Family Medicine

## 2021-03-10 ENCOUNTER — Other Ambulatory Visit: Payer: Self-pay

## 2021-03-10 DIAGNOSIS — B349 Viral infection, unspecified: Secondary | ICD-10-CM

## 2021-03-10 HISTORY — DX: Essential (primary) hypertension: I10

## 2021-03-10 MED ORDER — PROMETHAZINE-DM 6.25-15 MG/5ML PO SYRP: 5.0000 mL | ORAL_SOLUTION | Freq: Three times a day (TID) | ORAL | 0 refills | Status: DC | PRN

## 2021-03-10 MED ORDER — LEVOCETIRIZINE DIHYDROCHLORIDE 5 MG PO TABS
5.0000 mg | ORAL_TABLET | Freq: Every evening | ORAL | 0 refills | Status: DC
Start: 2021-03-10 — End: 2021-05-19

## 2021-03-10 NOTE — Discharge Instructions (Addendum)
Your COVID 19 results should result within 3-4 days. Negative results are immediately resulted to Mychart. Positive results will receive a follow-up call from our clinic. If symptoms are present, I recommend home quarantine until results are known.  Alternate Tylenol and ibuprofen as needed for body aches and fever.  Symptom management per recommendations discussed today.  If any breathing difficulty or chest pain develops go immediately to the closest emergency department for evaluation.

## 2021-03-10 NOTE — ED Provider Notes (Signed)
[facility]    CSN: [id number] Arrival date & time: 03/10/21  1319      History   Chief Complaint No chief complaint on file.   HPI Sarah Cross is a 40 y.o. female.   HPI  Patient presents today with symptoms of fatigue, nasal congestion sensation of being hot and cold and has been out of work since last week.  Took a COVID test at home which was negative.  Patient is fully vaccinated and boosted.  He endorses a mild cough however is not having any difficulty breathing.  Past Medical History:  Diagnosis Date  . Abnormal Pap smear   . Endometriosis   . History of DVT of lower extremity 2008   1 week after bladder surgery  . 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 Hypertension   . Nausea 12/26/2014  . Pregnant 12/26/2014  . Sickle cell anemia (HCC)    has trait  . Spotting during pregnancy in first trimester 12/26/2014  . Vaginal Pap smear, abnormal     Patient Active Problem List   Diagnosis Date Noted  . Dizzy spells 08/07/2020  . Hypertension 08/07/2020  . Encounter for screening fecal occult blood testing 08/07/2020  . Encounter for well woman exam with routine gynecological exam 08/07/2020  . Menorrhagia with irregular cycle 08/05/2018  . Pregnancy examination or test, negative result 08/05/2018  . Routine cervical smear 08/05/2018  . Encounter for gynecological examination with Papanicolaou smear of cervix 08/05/2018  . Abnormal skin growth 08/05/2018  . History of DVT of lower extremity   . 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 Ovarian cyst, left 01/28/2015  . Gonorrhea affecting pregnancy in first trimester, antepartum 01/19/2015  . Sickle cell trait ([place]) 01/19/2015  . H/O cesarean section complicating pregnancy 78/24/2353  . Nausea 12/26/2014  . 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 Dysplasia of cervix, unspecified 04/07/2013  . 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  Endometriosis 02/13/2013    Past Surgical History:  Procedure Laterality Date  . BLADDER TUMOR EXCISION    . CESAREAN SECTION    . CESAREAN SECTION N/A 07/20/2015   Procedure: REPEAT CESAREAN SECTION;  Surgeon: Jonnie Kind, MD;  Location: [facility];  Service: Obstetrics;  Laterality: N/A;  . CSF SHUNT      OB History    Gravida  2   Para  2   Term  1   Preterm  1   AB      Living  2     SAB      IAB      Ectopic      Multiple  0   Live Births  2            Home Medications    Prior to Admission medications   Medication Sig Start Date End Date Taking? Authorizing Provider  Acetaminophen (TYLENOL PO) Take by mouth.    [provider]  losartan (COZAAR) 25 MG tablet TAKE 1 TABLET(25 MG) BY MOUTH DAILY**MUST MAKE APPT** 02/10/21   Estill Dooms, NP    Family History Family History  Problem Relation Age of Onset  . Diabetes Mother   . Cancer Mother        lung  . Hypertension Father   . Heart disease Maternal Grandmother   . Diabetes Maternal Grandmother   . Heart disease Paternal Grandmother   . Diabetes  Paternal Grandmother   . Cancer Paternal Grandfather   . Diabetes Sister   . Heart disease Sister   . Thyroid disease Sister   . Diabetes Sister     Social History Social History   Tobacco Use  . Smoking status: Never Smoker  . Smokeless tobacco: Never Used  Vaping Use  . Vaping Use: Never used  Substance Use Topics  . Alcohol use: No  . Drug use: No     Allergies   Latex   Review of Systems Review of Systems Pertinent negatives listed in HPI   Physical Exam Triage Vital Signs ED Triage Vitals  Enc Vitals Group     BP 03/10/21 1405 (!) 159/100     Pulse Rate 03/10/21 1405 71     Resp 03/10/21 1405 19     Temp 03/10/21 1405 98.8 F (37.1 C)     Temp src --      SpO2 03/10/21 1405 99 %     Weight --      Height --      Head Circumference --      Peak Flow --      Pain Score 03/10/21 1404 0     Pain Loc  --      Pain Edu? --      Excl. in [place]? --    No data found.  Updated Vital Signs BP (!) 159/100 (BP Location: Right Arm) Comment: has not taken bp meds today  Pulse 71   Temp 98.8 F (37.1 C)   Resp 19   LMP 03/02/2021   SpO2 99%   Visual Acuity Right Eye Distance:   Left Eye Distance:   Bilateral Distance:    Right Eye Near:   Left Eye Near:    Bilateral Near:     Physical Exam  General Appearance:    Alert, not acutely ill-appearing, cooperative, no distress  HENT:   Normocephalic, ears normal, nares mucosal edema with congestion, rhinorrhea, oropharynx    Eyes:    PERRL, conjunctiva/corneas clear, EOM's intact       Lungs:     Clear to auscultation bilaterally, respirations unlabored  Heart:    Regular rate and rhythm  Neurologic:   Awake, alert, oriented x 3. No apparent focal neurological           defect.      UC Treatments / Results  Labs (all labs ordered are listed, but only abnormal results are displayed) Labs Reviewed - No data to display  EKG   Radiology No results found.  Procedures Procedures (including critical care time)  Medications Ordered in UC Medications - No data to display  Initial Impression / Assessment and Plan / UC Course  I have reviewed the triage vital signs and the nursing notes.  Pertinent labs & imaging results that were available during my care of the patient were reviewed by me and considered in my medical decision making (see chart for details).     COVID/Flu test pending.  Symptoms have been present nearly a week therefore as long as patient is asymptomatic of fever may return to work in 2 days.  Work note given for 2 days.  Symptom management warranted only.  Manage fever with Tylenol and ibuprofen.  Nasal symptoms with over-the-counter antihistamines recommended.  Treatment per discharge medications/discharge instructions.  Red flags/ER precautions given. The most current CDC isolation/quarantine recommendation advised.   Final Clinical Impressions(s) / UC Diagnoses   Final diagnoses:  Viral illness  Discharge Instructions     Your COVID 19 results should result within 3-4 days. Negative results are immediately resulted to Mychart. Positive results will receive a follow-up call from our clinic. If symptoms are present, I recommend home quarantine until results are known.  Alternate Tylenol and ibuprofen as needed for body aches and fever.  Symptom management per recommendations discussed today.  If any breathing difficulty or chest pain develops go immediately to the closest emergency department for evaluation.     ED Prescriptions    Medication Sig Dispense Auth. Provider   levocetirizine (XYZAL) 5 MG tablet Take 1 tablet (5 mg total) by mouth every evening. 30 tablet Scot Jun, FNP   promethazine-dextromethorphan (PROMETHAZINE-DM) 6.25-15 MG/5ML syrup Take 5 mLs by mouth 3 (three) times daily as needed for cough. 118 mL Scot Jun, FNP     PDMP not reviewed this encounter.   Scot Jun, FNP 03/10/21 1500

## 2021-03-10 NOTE — ED Triage Notes (Signed)
Fatigue, cold/hot, congestion. Has been out of work since last Tuesday.  Had neg at home covid test.

## 2021-03-11 ENCOUNTER — Encounter: Payer: Self-pay | Admitting: Orthopedic Surgery

## 2021-03-11 ENCOUNTER — Other Ambulatory Visit: Payer: Self-pay

## 2021-03-11 ENCOUNTER — Ambulatory Visit: Payer: Medicaid Other

## 2021-03-11 ENCOUNTER — Ambulatory Visit (INDEPENDENT_AMBULATORY_CARE_PROVIDER_SITE_OTHER): Payer: Medicaid Other | Admitting: Orthopedic Surgery

## 2021-03-11 VITALS — BP 166/97 | HR 76 | Ht 62.0 in | Wt 160.0 lb

## 2021-03-11 DIAGNOSIS — M25611 Stiffness of right shoulder, not elsewhere classified: Secondary | ICD-10-CM | POA: Diagnosis not present

## 2021-03-11 DIAGNOSIS — G8929 Other chronic pain: Secondary | ICD-10-CM

## 2021-03-11 DIAGNOSIS — M25511 Pain in right shoulder: Secondary | ICD-10-CM
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
## 2021-03-12 ENCOUNTER — Other Ambulatory Visit: Payer: Self-pay | Admitting: Orthopedic Surgery

## 2021-03-12 DIAGNOSIS — G8929 Other chronic pain: Secondary | ICD-10-CM

## 2021-03-12 LAB — COVID-19, FLU A+B NAA
Influenza A, NAA: NOT DETECTED
Influenza B, NAA: NOT DETECTED
SARS-CoV-2, NAA: DETECTED — AB

## 2021-04-07 ENCOUNTER — Other Ambulatory Visit: Payer: Self-pay | Admitting: Family Medicine

## 2021-05-19 ENCOUNTER — Other Ambulatory Visit: Payer: Self-pay

## 2021-05-19 ENCOUNTER — Encounter: Payer: Self-pay | Admitting: Adult Health

## 2021-05-19 ENCOUNTER — Ambulatory Visit: Payer: Medicaid Other | Admitting: Adult Health

## 2021-05-19 VITALS — BP 137/87 | HR 79 | Ht 62.0 in | Wt 159.5 lb

## 2021-05-19 DIAGNOSIS — N939 Abnormal uterine and vaginal bleeding, unspecified: Secondary | ICD-10-CM | POA: Diagnosis not present

## 2021-05-19 DIAGNOSIS — Z7689 Persons encountering health services in other specified circumstances: Secondary | ICD-10-CM

## 2021-05-19 DIAGNOSIS — Z3202 Encounter for pregnancy test, result negative: Secondary | ICD-10-CM | POA: Diagnosis not present

## 2021-05-19 DIAGNOSIS — N760 Acute vaginitis: Secondary | ICD-10-CM

## 2021-05-19 DIAGNOSIS — L910 Hypertrophic scar: Secondary | ICD-10-CM | POA: Diagnosis not present

## 2021-05-19 DIAGNOSIS — B9689 Other specified bacterial agents as the cause of diseases classified elsewhere: Secondary | ICD-10-CM

## 2021-05-19 LAB — POCT URINE PREGNANCY: Preg Test, Ur: NEGATIVE

## 2021-05-19 LAB — POCT WET PREP (WET MOUNT): Clue Cells Wet Prep Whiff POC: POSITIVE

## 2021-05-19 MED ORDER — METRONIDAZOLE 500 MG PO TABS: 500.0000 mg | ORAL_TABLET | Freq: Two times a day (BID) | ORAL | 0 refills | Status: DC

## 2021-05-19 MED ORDER — NORETHINDRONE 0.35 MG PO TABS: 1.0000 | ORAL_TABLET | Freq: Every day | ORAL | 11 refills | Status: DC

## 2021-05-19 NOTE — Progress Notes (Signed)
  Subjective:     Patient ID: [name], female   DOB: 1981-02-01, 40 y.o.   MRN: [id number]  HPI Gloriann is a 40 year old black female, divorced, G2P1102 in complaining of bleeding from July 17 til yesterday. She wants to get on the pill, she has history of DVT,years ago she says. PCP is Dr Gerarda Fraction. Lab Results  Component Value Date   DIAGPAP  08/05/2018    NEGATIVE FOR INTRAEPITHELIAL LESIONS OR MALIGNANCY.   HPV NOT DETECTED 08/05/2018    Review of Systems Abnormal bleeding No sex in over a year Reviewed past medical,surgical, social and family history. Reviewed medications and allergies.     Objective:   Physical Exam BP 137/87 (BP Location: Left Arm, Patient Position: Sitting, Cuff Size: Normal)   Pulse 79   Ht '5\' 2"'$  (1.575 m)   Wt 159 lb 8 oz (72.3 kg)   LMP 04/27/2021   BMI 29.17 kg/m  UPT is negative. Skin warm and dry. Has ?large keloid at navel. Pelvic: external genitalia is normal in appearance no lesions, vagina: tan discharge with odor,urethra has no lesions or masses noted, cervix:smooth and bulbous, uterus: normal size, shape and contour, non tender, no masses felt, adnexa: no masses or tenderness noted. Bladder is non tender and no masses felt. Wet prep: + for clue cells and WBCs.   Fall risk is low  Upstream - 42/08/22 1624       Pregnancy Intention Screening   Does the patient want to become pregnant in the next year? Ok Either Way    Does the patient's partner want to become pregnant in the next year? Ok Either Way    Would the patient like to discuss contraceptive options today? Yes      Contraception Wrap Up   Current Method Abstinence    End Method Oral Contraceptive    Contraception Counseling Provided Yes            Examination chaperoned by Levy Pupa LPN  Assessment:     1. Pregnancy examination or test, negative result   2. Abnormal uterine bleeding (AUB) Will Rx POP due history of DVT, Rx Micronor,can start today Meds ordered  this encounter  Medications   norethindrone (MICRONOR) 0.35 MG tablet    Sig: Take 1 tablet (0.35 mg total) by mouth daily.    Dispense:  28 tablet    Refill:  11    Order Specific Question:   Supervising Provider    Answer:   Tania Ade H [[id number]]   metroNIDAZOLE (FLAGYL) 500 MG tablet    Sig: Take 1 tablet (500 mg total) by mouth 2 (two) times daily.    Dispense:  14 tablet    Refill:  0    Order Specific Question:   Supervising Provider    Answer:   Elonda Husky, LUTHER H [[id number]]     3. BV (bacterial vaginosis) Will rx Flagyl   4. Keloid Referred to Dr Arnoldo Morale for removal  5. Encounter for menstrual regulation Will Rx Micronor    Plan:     Follow up in 3 months for pap and physical and ROS

## 2021-06-02 ENCOUNTER — Telehealth: Payer: Self-pay | Admitting: Orthopedic Surgery

## 2021-06-02 DIAGNOSIS — M25611 Stiffness of right shoulder, not elsewhere classified: Secondary | ICD-10-CM

## 2021-06-02 NOTE — Telephone Encounter (Signed)
Patient called today stating that she saw Dr. Thedore Mins in June and had injection.  She said it has not helped that much and said that we told her that we would get an MRI set up for her if injection did not help.  Can this be scheduled or does the patient need to come back in first?  Please advise  Thanks

## 2021-06-04 NOTE — Telephone Encounter (Signed)
Pt given information from the provider. Pt would like to proceed with MRI and PT at this time. Orders placed.

## 2021-06-10 ENCOUNTER — Encounter: Payer: Self-pay | Admitting: General Surgery

## 2021-06-10 ENCOUNTER — Ambulatory Visit (HOSPITAL_COMMUNITY): Payer: Medicaid Other | Attending: Orthopedic Surgery | Admitting: Occupational Therapy

## 2021-06-10 ENCOUNTER — Ambulatory Visit: Payer: Medicaid Other | Admitting: General Surgery

## 2021-06-10 ENCOUNTER — Other Ambulatory Visit: Payer: Self-pay

## 2021-06-10 ENCOUNTER — Encounter (HOSPITAL_COMMUNITY): Payer: Self-pay | Admitting: Occupational Therapy

## 2021-06-10 VITALS — BP 153/88 | HR 72 | Temp 98.6°F | Resp 16 | Ht 62.0 in | Wt 159.0 lb

## 2021-06-10 DIAGNOSIS — G8929 Other chronic pain: Secondary | ICD-10-CM | POA: Diagnosis present

## 2021-06-10 DIAGNOSIS — R29898 Other symptoms and signs involving the musculoskeletal system: Secondary | ICD-10-CM | POA: Diagnosis present

## 2021-06-10 DIAGNOSIS — D492 Neoplasm of unspecified behavior of bone, soft tissue, and skin: Secondary | ICD-10-CM

## 2021-06-10 DIAGNOSIS — M25511 Pain in right shoulder: Secondary | ICD-10-CM | POA: Insufficient documentation

## 2021-06-10 NOTE — Therapy (Signed)
[place], [place], [address] Phone: [phone]   Fax:  [phone]  Occupational Therapy Evaluation  Patient Details  Name: Sarah Cross MRN: [id number] Date of Birth: 03/04/1981 Referring Provider (OT): Dr. Larena Glassman   Encounter Date: 06/10/2021   OT End of Session - 06/10/21 1714     Visit Number 1    Number of Visits 8    Date for OT Re-Evaluation 07/10/21    Authorization Type [place] Medicaid    Authorization Time Period requesting 8 visits    Authorization - Visit Number 0    Authorization - Number of Visits 8    OT Start Time 1632    OT Stop Time 1705    OT Time Calculation (min) 33 min    Activity Tolerance Patient tolerated treatment well    Behavior During Therapy [facility] for tasks assessed/performed             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   Subjective Assessment - 06/10/21 1706     Subjective  S: This has been a problem for over a year.    Pertinent History Pt is a 40 y/o female presenting with right shoulder pain, ongoing since an incident in 04/2020. Pt has received approximately 5 injections since this began, the last was on 04/09/21 which was an US guided injection. After  this injection pt reports numbness until approximately 05/27/21. Pt describes her pain as severe and impacting her ability to perform daily and work duties. Pt was referred to occupational therapy for evaluation and treatment by Dr. Larena Glassman. Of note-Dr. Amedeo Kinsman has ordered an MRI, but it has yet to be scheduled.    Special Tests DASH: 52.27    Patient Stated Goals To have less pain and be able to use my arm    Currently in Pain? Yes    Pain Score 3     Pain Location Shoulder    Pain Orientation Right    Pain Descriptors / Indicators Sore;Shooting;Sharp    Pain Type Chronic pain    Pain Radiating Towards hand    Pain Onset More than a month ago    Pain Frequency Constant    Aggravating Factors  use, trying to sleep    Pain Relieving Factors pain medication    Effect of Pain on Daily Activities max effect on ADLs    Multiple Pain Sites No               [facility] OT Assessment - 06/10/21 1629       Assessment  Medical Diagnosis right shoulder pain    Referring Provider (OT) Dr. Larena Glassman    Onset Date/Surgical Date 04/25/20   approximate onset date   Hand Dominance Right    Next MD Visit non-scheduled    Prior Therapy None      Precautions   Precautions None      Restrictions   Weight Bearing Restrictions No      Balance Screen   Has the patient fallen in the past 6 months No      Prior Function   Level of Independence Independent    Vocation Full time employment    Vocation Requirements [employer]-52 and 65 year olds    Leisure walking      ADL   ADL comments Pt is having difficulty with sleeping, reaching for items overhead and behind back, lifting items.      Written Expression   Dominant Hand Right      Cognition   Overall Cognitive Status Within Functional Limits for tasks assessed      Observation/Other Assessments   Other Surveys  Select    Quick DASH  52.27      ROM / Strength   AROM / PROM / Strength AROM;PROM;Strength       Palpation   Palpation comment mod to max fascial restrictions along upper arm, trapezius, and scapular regions      AROM   Overall AROM Comments Assessed seated, er/IR adducted    AROM Assessment Site Shoulder    Right/Left Shoulder Right    Right Shoulder Flexion 122 Degrees    Right Shoulder ABduction 109 Degrees    Right Shoulder Internal Rotation 90 Degrees    Right Shoulder External Rotation 90 Degrees      PROM   Overall PROM Comments Assessed supine, er/IR abducted    PROM Assessment Site Shoulder    Right/Left Shoulder Right    Right Shoulder Flexion 131 Degrees    Right Shoulder ABduction 138 Degrees    Right Shoulder Internal Rotation 44 Degrees    Right Shoulder External Rotation 90 Degrees      Strength   Overall Strength Comments Assessed seated, er/IR adducted    Strength Assessment Site Shoulder    Right/Left Shoulder Right    Right Shoulder Flexion 3-/5    Right Shoulder ABduction 3-/5    Right Shoulder Internal Rotation 3/5    Right Shoulder External Rotation 3/5                 Quick Dash - 06/10/21 1659     Open a tight or new jar Moderate difficulty    Do heavy household chores (wash walls, wash floors) Moderate difficulty    Carry a shopping bag or briefcase No difficulty    Wash your back Severe difficulty    Use a knife to cut food No difficulty    Recreational activities in which you take some force or impact through your arm, shoulder, or hand (golf, hammering, tennis) Moderate difficulty    During the past week, to what extent has your arm, shoulder or hand problem interfered with your normal social activities with family, friends, neighbors, or groups? Quite a bit    During the past week, to what extent has your arm, shoulder or hand problem limited your work or other regular daily activities Modererately    Arm, shoulder, or hand pain. Extreme    Tingling (pins and needles) in your arm, shoulder, or hand Moderate  Difficulty Sleeping  Severe difficulty    DASH Score 52.27 %                        OT Education - 06/10/21 1656     Education Details scapular A/ROM    Person(s) Educated Patient    Methods Explanation;Demonstration;Handout    Comprehension Verbalized understanding;Returned demonstration              OT Short Term Goals - 06/10/21 1717       OT SHORT TERM GOAL #1   Title Pt will be provided with and educated on HEP to improve ability to perform ADL tasks using RUE as dominant.    Time 4    Period Weeks    Status New    Target Date 07/10/21      OT SHORT TERM GOAL #2   Title Pt will decrease pain in RUE to 4/10 or less to improve ability to sleep for 2+ consecutive hours without waking due to pain.    Time 4    Period Weeks    Status New      OT SHORT TERM GOAL #3   Title Pt will increase RUE A/ROM to [facility] to improve ability to reach for items in overhead cabinets or shelves at work.    Time 4    Period Weeks    Status New      OT SHORT TERM GOAL #4   Title Pt will decrease RUE fascial restrictions to min amounts to improve mobility required for functional reaching tasks.    Time 4    Period Weeks    Status New      OT SHORT TERM GOAL #5   Title Pt will increase RUE strength to 4/5 or greater to improve ability to lift objects at work and at home.    Time 4    Period Weeks    Status New                      Plan - 06/10/21 1715     Clinical Impression Statement A: Pt is a 40 y/o female presenting with right shoulder pain with onset approximately 1 year ago. Pt reports severe pain at night when trying to sleep, pain is slightly less during the day. Her daily functioning and task completion is limited due to pain and weakness.    OT Occupational Profile and History Problem Focused Assessment - Including review of records relating to presenting problem    Occupational performance deficits (Please refer to evaluation for details): ADL's;IADL's;Rest and  Sleep;Work;Leisure    Body Structure / Function / Physical Skills ADL;Endurance;Muscle spasms;UE functional use;Fascial restriction;Pain;ROM;IADL;Strength    Rehab Potential Good    Clinical Decision Making Limited treatment options, no task modification necessary    Comorbidities Affecting Occupational Performance: None    Modification or Assistance to Complete Evaluation  No modification of tasks or assist necessary to complete eval    OT Frequency 2x / week    OT Duration 4 weeks    OT Treatment/Interventions Self-care/ADL training;Ultrasound;DME and/or AE instruction;Patient/family education;Passive range of motion;Cryotherapy;Electrical Stimulation;Moist Heat;Therapeutic exercise;Manual Therapy;Therapeutic activities    Plan P: Pt will benefit from skilled OT services to decrease pain and fascial restrictions, increase joint ROM, strength, and functional use of RUE. Treatment plan: myofascial release and manual techniques, P/ROM, A/ROM, general RUE strengthening, scapular mobility/stability/strengthening, modalities prn    OT Home Exercise Plan eval: scapular A/ROM  Consulted and Agree with Plan of Care Patient             Patient will benefit from skilled therapeutic intervention in order to improve the following deficits and impairments:   Body Structure / Function / Physical Skills: ADL, Endurance, Muscle spasms, UE functional use, Fascial restriction, Pain, ROM, IADL, Strength       Visit Diagnosis: Chronic right shoulder pain  Other symptoms and signs involving the musculoskeletal system    Problem List Patient Active Problem List   Diagnosis Date Noted   BV (bacterial vaginosis) 05/19/2021   Abnormal uterine bleeding (AUB) 05/19/2021   Keloid 05/19/2021   Encounter for menstrual regulation 05/19/2021   Dizzy spells 08/07/2020   Hypertension 08/07/2020   Encounter for screening fecal occult blood testing 08/07/2020   Encounter for well woman exam with routine  gynecological exam 08/07/2020   Menorrhagia with irregular cycle 08/05/2018   Pregnancy examination or test, negative result 08/05/2018   Routine cervical smear 08/05/2018   Encounter for gynecological examination with Papanicolaou smear of cervix 08/05/2018   Abnormal skin growth 08/05/2018   History of DVT of lower extremity    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

## 2021-06-10 NOTE — Patient Instructions (Signed)
Scapular A/ROM  1) Seated Row   Sit up straight with elbows by your sides. Pull back with shoulders/elbows, keeping forearms straight, as if pulling back on the reins of a horse. Squeeze shoulder blades together. Repeat _10-15__times, __2-3__sets/day    2) Shoulder Elevation    Sit up straight with arms by your sides. Slowly bring your shoulders up towards your ears. Repeat_10-15__times, __2-3__ sets/day    3) Shoulder Extension    Sit up straight with both arms by your side, draw your arms back behind your waist. Keep your elbows straight. Repeat __10-15__times, __2-3__sets/day.

## 2021-06-11 NOTE — Progress Notes (Signed)
Sarah Cross; [id number]; 02/08/1981   HPI Patient is a 40 year old black female who was referred to my care by Derrek Monaco for evaluation and treatment of a keloid formation around the umbilicus.  Patient states has been present for many years.  It occasionally gets irritated and bleeds.  She denies any surgical incision in the area.  Patient denies any history of skin cancer. 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   Hydrocephalus in newborn [facility])    Hypertension    Nausea 12/26/2014   Pregnant 12/26/2014   Sickle cell anemia ([place])    has trait   Spotting during pregnancy in first trimester 12/26/2014   Vaginal Pap smear, abnormal     Past Surgical History:  Procedure Laterality Date   BLADDER TUMOR EXCISION     CESAREAN SECTION     CESAREAN SECTION N/A 07/20/2015   Procedure: REPEAT CESAREAN SECTION;  Surgeon: Jonnie Kind, MD;  Location: [facility];  Service: Obstetrics;  Laterality: N/A;   CSF SHUNT      Family History  Problem Relation Age of Onset   Diabetes Mother    Cancer Mother        lung   Hypertension Father    Heart disease Maternal Grandmother    Diabetes Maternal Grandmother    Heart disease Paternal Grandmother    Diabetes Paternal Grandmother    Cancer Paternal Grandfather    Diabetes Sister    Heart disease Sister    Thyroid disease Sister    Diabetes Sister     Current Outpatient Medications on File Prior to Visit  Medication Sig Dispense Refill   Acetaminophen (TYLENOL PO) Take by mouth.     ibuprofen (ADVIL) 800 MG tablet Take 800 mg by mouth.     losartan (COZAAR) 25 MG tablet TAKE 1 TABLET(25 MG) BY MOUTH DAILY**MUST MAKE APPT** 15 tablet 0   norethindrone (MICRONOR) 0.35 MG tablet Take 1 tablet (0.35 mg total) by mouth daily. 28 tablet 11   traMADol (ULTRAM) 50 MG tablet Take  50-100 mg by mouth 4 (four) times daily as needed.     Vitamin D, Ergocalciferol, (DRISDOL) 1.25 MG (50000 UNIT) CAPS capsule Take 50,000 Units by mouth once a week.     No current facility-administered medications on file prior to visit.    Allergies  Allergen Reactions   Latex Itching and Other (See Comments)    Burns hands    Social History   Substance and Sexual Activity  Alcohol Use No    Social History   Tobacco Use  Smoking Status Never  Smokeless Tobacco Never    Review of Systems  Constitutional: Negative.   HENT: Negative.    Eyes: Negative.   Respiratory: Negative.    Cardiovascular: Negative.   Gastrointestinal:  Positive for heartburn.  Genitourinary: Negative.   Musculoskeletal: Negative.   Skin: Negative.   Neurological: Negative.   Endo/Heme/Allergies: Negative.   Psychiatric/Behavioral: Negative.     Objective   Vitals:   06/10/21 1449  BP: (!) 153/88  Pulse: 72  Resp: 16  Temp: 98.6 F (37 C)  SpO2: 99%    Physical Exam Vitals reviewed.  Constitutional:      Appearance: Normal appearance. She is normal weight. She is not ill-appearing.  HENT:  Head: Normocephalic and atraumatic.  Cardiovascular:     Rate and Rhythm: Normal rate and regular rhythm.     Heart sounds: Normal heart sounds. No murmur heard.   No gallop.  Pulmonary:     Effort: Pulmonary effort is normal. No respiratory distress.     Breath sounds: Normal breath sounds. No stridor. No wheezing, rhonchi or rales.  Abdominal:     General: Abdomen is flat. Bowel sounds are normal. There is no distension.     Palpations: Abdomen is soft. There is mass.     Tenderness: There is no abdominal tenderness. There is no guarding or rebound.     Hernia: No hernia is present.     Comments: Large raised hyperpigmented skin lesion emanating from the umbilicus.  It was encompassing the whole umbilicus.  Slight ulceration present without active bleeding.  About 5 cm in its greatest  diameter.  Skin:    General: Skin is warm and dry.  Neurological:     Mental Status: She is alert and oriented to person, place, and time.  Media Information Document Information  Photos    06/10/2021 15:07  Attached To:  Office Visit on 06/10/21 with Aviva Signs, MD   Source Information  Aviva Signs, MD  [facility]    Assessment  Large skin neoplasm, umbilicus.  Concerning for possible malignancy. Plan  Patient is scheduled for excision of the skin neoplasm of the abdominal wall on 07/04/2021.  The risks and benefits of the procedure were fully explained to the patient, who gave informed consent.  She realizes that she will not have a bellybutton at the end of the procedure.

## 2021-06-11 NOTE — H&P (Signed)
Sarah Cross; [id number]; 1981/02/02   HPI Patient is a 40 year old black female who was referred to my care by Derrek Monaco for evaluation and treatment of a keloid formation around the umbilicus.  Patient states has been present for many years.  It occasionally gets irritated and bleeds.  She denies any surgical incision in the area.  Patient denies any history of skin cancer. 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   Hydrocephalus in newborn [facility])    Hypertension    Nausea 12/26/2014   Pregnant 12/26/2014   Sickle cell anemia ([place])    has trait   Spotting during pregnancy in first trimester 12/26/2014   Vaginal Pap smear, abnormal     Past Surgical History:  Procedure Laterality Date   BLADDER TUMOR EXCISION     CESAREAN SECTION     CESAREAN SECTION N/A 07/20/2015   Procedure: REPEAT CESAREAN SECTION;  Surgeon: Jonnie Kind, MD;  Location: [facility];  Service: Obstetrics;  Laterality: N/A;   CSF SHUNT      Family History  Problem Relation Age of Onset   Diabetes Mother    Cancer Mother        lung   Hypertension Father    Heart disease Maternal Grandmother    Diabetes Maternal Grandmother    Heart disease Paternal Grandmother    Diabetes Paternal Grandmother    Cancer Paternal Grandfather    Diabetes Sister    Heart disease Sister    Thyroid disease Sister    Diabetes Sister     Current Outpatient Medications on File Prior to Visit  Medication Sig Dispense Refill   Acetaminophen (TYLENOL PO) Take by mouth.     ibuprofen (ADVIL) 800 MG tablet Take 800 mg by mouth.     losartan (COZAAR) 25 MG tablet TAKE 1 TABLET(25 MG) BY MOUTH DAILY**MUST MAKE APPT** 15 tablet 0   norethindrone (MICRONOR) 0.35 MG tablet Take 1 tablet (0.35 mg total) by mouth daily. 28 tablet 11   traMADol (ULTRAM) 50 MG tablet Take  50-100 mg by mouth 4 (four) times daily as needed.     Vitamin D, Ergocalciferol, (DRISDOL) 1.25 MG (50000 UNIT) CAPS capsule Take 50,000 Units by mouth once a week.     No current facility-administered medications on file prior to visit.    Allergies  Allergen Reactions   Latex Itching and Other (See Comments)    Burns hands    Social History   Substance and Sexual Activity  Alcohol Use No    Social History   Tobacco Use  Smoking Status Never  Smokeless Tobacco Never    Review of Systems  Constitutional: Negative.   HENT: Negative.    Eyes: Negative.   Respiratory: Negative.    Cardiovascular: Negative.   Gastrointestinal:  Positive for heartburn.  Genitourinary: Negative.   Musculoskeletal: Negative.   Skin: Negative.   Neurological: Negative.   Endo/Heme/Allergies: Negative.   Psychiatric/Behavioral: Negative.     Objective   Vitals:   06/10/21 1449  BP: (!) 153/88  Pulse: 72  Resp: 16  Temp: 98.6 F (37 C)  SpO2: 99%    Physical Exam Vitals reviewed.  Constitutional:      Appearance: Normal appearance. She is normal weight. She is not ill-appearing.  HENT:  Head: Normocephalic and atraumatic.  Cardiovascular:     Rate and Rhythm: Normal rate and regular rhythm.     Heart sounds: Normal heart sounds. No murmur heard.   No gallop.  Pulmonary:     Effort: Pulmonary effort is normal. No respiratory distress.     Breath sounds: Normal breath sounds. No stridor. No wheezing, rhonchi or rales.  Abdominal:     General: Abdomen is flat. Bowel sounds are normal. There is no distension.     Palpations: Abdomen is soft. There is mass.     Tenderness: There is no abdominal tenderness. There is no guarding or rebound.     Hernia: No hernia is present.     Comments: Large raised hyperpigmented skin lesion emanating from the umbilicus.  It was encompassing the whole umbilicus.  Slight ulceration present without active bleeding.  About 5 cm in its greatest  diameter.  Skin:    General: Skin is warm and dry.  Neurological:     Mental Status: She is alert and oriented to person, place, and time.  Media Information Document Information  Photos    06/10/2021 15:07  Attached To:  Office Visit on 06/10/21 with Aviva Signs, MD   Source Information  Aviva Signs, MD  [facility]    Assessment  Large skin neoplasm, umbilicus.  Concerning for possible malignancy. Plan  Patient is scheduled for excision of the skin neoplasm of the abdominal wall on 07/04/2021.  The risks and benefits of the procedure were fully explained to the patient, who gave informed consent.  She realizes that she will not have a bellybutton at the end of the procedure.

## 2021-06-12 ENCOUNTER — Other Ambulatory Visit: Payer: Self-pay

## 2021-06-12 ENCOUNTER — Encounter (HOSPITAL_COMMUNITY): Payer: Self-pay

## 2021-06-12 ENCOUNTER — Ambulatory Visit (HOSPITAL_COMMUNITY): Payer: Medicaid Other | Attending: Orthopedic Surgery

## 2021-06-12 DIAGNOSIS — M25511 Pain in right shoulder: Secondary | ICD-10-CM | POA: Diagnosis not present

## 2021-06-12 DIAGNOSIS — G8929 Other chronic pain: Secondary | ICD-10-CM | POA: Diagnosis present

## 2021-06-12 DIAGNOSIS — R29898 Other symptoms and signs involving the musculoskeletal system: Secondary | ICD-10-CM | POA: Insufficient documentation

## 2021-06-12 NOTE — Therapy (Signed)
[place], [place], [address] Phone: [phone]   Fax:  [phone]  Occupational Therapy Treatment  Patient Details  Name: Sarah Cross MRN: [id number] Date of Birth: Nov 06, 1980 Referring Provider (OT): Dr. Larena Glassman   Encounter Date: 06/12/2021   OT End of Session - 06/12/21 1421     Visit Number 2    Number of Visits 8    Date for OT Re-Evaluation 07/10/21    Authorization Type HB Medicaid    Authorization Time Period requesting 8 visits    Authorization - Visit Number 0    Authorization - Number of Visits 8    OT Start Time 1300    OT Stop Time [id number]    OT Time Calculation (min) 48 min    Activity Tolerance Patient tolerated treatment well    Behavior During Therapy [facility] for tasks assessed/performed             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   Subjective Assessment - 06/12/21 1326     Currently in Pain? Yes    Pain Score 5     Pain Location Shoulder    Pain Orientation Right    Pain Descriptors / Indicators Burning    Pain Type Chronic pain    Pain Radiating Towards down to hand    Pain Onset More than a month ago    Pain Frequency Constant    Aggravating Factors  use,  trying to sleep    Pain Relieving Factors pain medication    Effect of Pain on Daily Activities max effect on ADLs    Multiple Pain Sites No                [facility] OT Assessment - 06/12/21 1328       Assessment   Medical Diagnosis right shoulder pain      Precautions   Precautions None                      OT Treatments/Exercises (OP) - 06/12/21 1328       Exercises   Exercises Shoulder      Shoulder Exercises: Supine   Protraction PROM;AROM;10 reps    Horizontal ABduction PROM;AROM;10 reps    External Rotation PROM;AROM;10 reps    Internal Rotation PROM;AROM;10 reps    Flexion PROM;AROM;10 reps    ABduction PROM;10 reps      Modalities   Modalities Electrical Stimulation;Moist Heat      Moist Heat Therapy   Number Minutes Moist Heat 10 Minutes    Moist  Heat Location Shoulder      Electrical Stimulation   Electrical Stimulation Location right shoulder    Electrical Stimulation Action interferential    Electrical Stimulation Parameters 5.4 CV    Electrical Stimulation Goals Pain      Manual Therapy   Manual Therapy Myofascial release    Manual therapy comments Manual therapy completed prior to exercises    Myofascial Release Myofascial release and manual stretching completed to the right upper arm, uppe trapezius, and scapularis region to decrease fascial restrictions and increase joint mobility in a pain free zone.                    OT Education - 06/12/21 1421     Education Details provided handout with purchasing information for TENS unit.    Person(s) Educated Patient    Methods Explanation;Handout    Comprehension Verbalized understanding              OT Short Term Goals - 06/12/21 1414       OT SHORT TERM GOAL #1   Title Pt will be provided with and educated on HEP to improve ability to perform ADL tasks using RUE as dominant.    Time 4    Period Weeks    Status On-going    Target Date 07/10/21      OT SHORT  TERM GOAL #2   Title Pt will decrease pain in RUE to 4/10 or less to improve ability to sleep for 2+ consecutive hours without waking due to pain.    Time 4    Period Weeks    Status On-going      OT SHORT TERM GOAL #3   Title Pt will increase RUE A/ROM to [facility] to improve ability to reach for items in overhead cabinets or shelves at work.    Time 4    Period Weeks    Status On-going      OT SHORT TERM GOAL #4   Title Pt will decrease RUE fascial restrictions to min amounts to improve mobility required for functional reaching tasks.    Time 4    Period Weeks    Status On-going      OT SHORT TERM GOAL #5   Title Pt will increase RUE strength to 4/5 or greater to improve ability to lift objects at work and at home.    Time 4    Period Weeks    Status On-going                      Plan - 06/12/21 1414     Clinical Impression Statement A: Initiated myofascial release to address max fascial restrictions locatated at the right upper arm and upper trapezius region. Completed gentle P/ROM then A/ROM supine. VC for form and technique. Ended session with ES and moist heat for pain management. patient tolerated well and reported a pain score of 2/10 when finished.    Body Structure / Function / Physical Skills ADL;Endurance;Muscle spasms;UE functional use;Fascial restriction;Pain;ROM;IADL;Strength    Plan P: Follow up on pain level after TENS/heat. Continue with myofascial release and gentle P/ROM. A/ROM to tolerance. therapy ball stretches.    Consulted and Agree with Plan of Care Patient             Patient will benefit from skilled therapeutic intervention in order to improve the following deficits and impairments:   Body Structure / Function / Physical Skills: ADL, Endurance, Muscle spasms, UE  functional use, Fascial restriction, Pain, ROM, IADL, Strength       Visit Diagnosis: Chronic right shoulder pain  Other symptoms and signs involving the musculoskeletal  system    Problem List Patient Active Problem List   Diagnosis Date Noted   BV (bacterial vaginosis) 05/19/2021   Abnormal uterine bleeding (AUB) 05/19/2021   Keloid 05/19/2021   Encounter for menstrual regulation 05/19/2021   Dizzy spells 08/07/2020   Hypertension 08/07/2020   Encounter for screening fecal occult blood testing 08/07/2020   Encounter for well woman exam with routine gynecological exam 08/07/2020   Menorrhagia with irregular cycle 08/05/2018   Pregnancy examination or test, negative result 08/05/2018   Routine cervical smear 08/05/2018   Encounter for gynecological examination with Papanicolaou smear of cervix 08/05/2018   Abnormal skin growth 08/05/2018   History of DVT of lower extremity    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 [place], [place], [address] Phone: [phone]   Fax:  [phone]  Name: Sarah Cross MRN: [id number] Date of Birth: 08-11-1981

## 2021-06-18 ENCOUNTER — Ambulatory Visit (HOSPITAL_COMMUNITY): Payer: Medicaid Other

## 2021-06-18 ENCOUNTER — Other Ambulatory Visit: Payer: Self-pay

## 2021-06-18 ENCOUNTER — Encounter (HOSPITAL_COMMUNITY): Payer: Self-pay

## 2021-06-18 DIAGNOSIS — G8929 Other chronic pain: Secondary | ICD-10-CM

## 2021-06-18 DIAGNOSIS — R29898 Other symptoms and signs involving the musculoskeletal system: Secondary | ICD-10-CM

## 2021-06-18 DIAGNOSIS — M25511 Pain in right shoulder: Secondary | ICD-10-CM | POA: Diagnosis not present

## 2021-06-18 NOTE — Therapy (Signed)
[facility] [address] [place], [place], [address] Phone: [phone]   Fax:  [phone]  Occupational Therapy Treatment  Patient Details  Name: Sarah Cross MRN: [id number] Date of Birth: 08/22/1981 Referring Provider (OT): Dr. Larena Glassman   Encounter Date: 06/18/2021   OT End of Session - 06/18/21 1710     Visit Number 3    Number of Visits 8    Date for OT Re-Evaluation 07/10/21    Authorization Type [place] Medicaid    Authorization Time Period requesting 8 visits    Authorization - Visit Number 0    Authorization - Number of Visits 8    OT Start Time 1120   pt arrived late   OT Stop Time 1203    OT Time Calculation (min) 43 min    Activity Tolerance Patient tolerated treatment well    Behavior During Therapy [facility] for tasks assessed/performed             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   Subjective Assessment - 06/18/21 1126     Subjective  S: It stayed good for probably a day and a half.    Currently in Pain? Yes    Pain Score 2     Pain Location Shoulder    Pain Orientation Right    Pain Descriptors / Indicators Dull    Pain Type Chronic pain    Pain Radiating Towards upper arm    Pain Onset More  than a month ago    Pain Frequency Constant    Aggravating Factors  use, trying to sleep    Pain Relieving Factors TENS unit and moist heat at last session.    Effect of Pain on Daily Activities max effect                [facility] OT Assessment - 06/18/21 1140       Assessment   Medical Diagnosis right shoulder pain      Precautions   Precautions None                      OT Treatments/Exercises (OP) - 06/18/21 1140       Exercises   Exercises Shoulder      Shoulder Exercises: Supine   Protraction PROM;10 reps    Horizontal ABduction PROM;10 reps    External Rotation PROM;10 reps    Internal Rotation PROM;10 reps    Flexion PROM;AROM;10 reps    ABduction PROM;10 reps      Shoulder Exercises: Standing   Protraction AROM;10 reps  External Rotation AROM;10 reps    Internal Rotation AROM;10 reps    Flexion AROM;10 reps    ABduction AROM;10 reps      Modalities   Modalities Electrical Stimulation;Moist Heat      Moist Heat Therapy   Number Minutes Moist Heat 10 Minutes    Moist Heat Location Shoulder      Electrical Stimulation   Electrical Stimulation Location right shoulder    Electrical Stimulation Action interferential    Electrical Stimulation Parameters 7.4 CV    Electrical Stimulation Goals Pain      Manual Therapy   Manual Therapy Myofascial release    Manual therapy comments Manual therapy completed prior to exercises    Myofascial Release Myofascial release and manual stretching completed to the right upper arm, uppe trapezius, and scapularis region to decrease fascial restrictions and increase joint mobility in a pain free zone.                  Upper Extremity Functional Index Score :   /80     OT Short Term Goals - 06/12/21 1414       OT SHORT TERM GOAL #1   Title Pt will be provided with and educated on HEP to improve ability to perform ADL tasks using RUE as dominant.    Time 4    Period Weeks    Status On-going     Target Date 07/10/21      OT SHORT TERM GOAL #2   Title Pt will decrease pain in RUE to 4/10 or less to improve ability to sleep for 2+ consecutive hours without waking due to pain.    Time 4    Period Weeks    Status On-going      OT SHORT TERM GOAL #3   Title Pt will increase RUE A/ROM to [facility] to improve ability to reach for items in overhead cabinets or shelves at work.    Time 4    Period Weeks    Status On-going      OT SHORT TERM GOAL #4   Title Pt will decrease RUE fascial restrictions to min amounts to improve mobility required for functional reaching tasks.    Time 4    Period Weeks    Status On-going      OT SHORT TERM GOAL #5   Title Pt will increase RUE strength to 4/5 or greater to improve ability to lift objects at work and at home.    Time 4    Period Weeks    Status On-going                      Plan - 06/18/21 1711     Clinical Impression Statement A: Myofascial release completed to address mod fascial restrictions in the right upper arm , upper trapezius region. Pt reports decreased pain upon arrival to session. Also reports TENS unit and moist heat provided relieft for 1.5 days after last session. Pt is able to demonstrate P/ROM [facility]. VC for form and technique were provided during session. Completed A/ROM standing although patient reported increased pain and burning right upper arm region during completion. ES and heat used at end of session for pain management.    Body Structure / Function / Physical Skills ADL;Endurance;Muscle spasms;UE functional use;Fascial restriction;Pain;ROM;IADL;Strength    Plan P: Continue with myofascial release an gentle passive ROM. Therapy ball stretches. End with ES and heat.    Consulted and Agree with Plan  of Care Patient             Patient will benefit from skilled therapeutic intervention in order to improve the following deficits and impairments:   Body Structure / Function / Physical Skills: ADL,  Endurance, Muscle spasms, UE functional use, Fascial restriction, Pain, ROM, IADL, Strength       Visit Diagnosis: Other symptoms and signs involving the musculoskeletal system  Chronic right shoulder pain    Problem List Patient Active Problem List   Diagnosis Date Noted   BV (bacterial vaginosis) 05/19/2021   Abnormal uterine bleeding (AUB) 05/19/2021   Keloid 05/19/2021   Encounter for menstrual regulation 05/19/2021   Dizzy spells 08/07/2020   Hypertension 08/07/2020   Encounter for screening fecal occult blood testing 08/07/2020   Encounter for well woman exam with routine gynecological exam 08/07/2020   Menorrhagia with irregular cycle 08/05/2018   Pregnancy examination or test, negative result 08/05/2018   Routine cervical smear 08/05/2018   Encounter for gynecological examination with Papanicolaou smear of cervix 08/05/2018   Abnormal skin growth 08/05/2018   History of DVT of lower extremity    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

## 2021-06-20 ENCOUNTER — Other Ambulatory Visit: Payer: Self-pay

## 2021-06-20 ENCOUNTER — Encounter (HOSPITAL_COMMUNITY): Payer: Self-pay | Admitting: Occupational Therapy

## 2021-06-20 ENCOUNTER — Ambulatory Visit (HOSPITAL_COMMUNITY): Payer: Medicaid Other | Admitting: Occupational Therapy

## 2021-06-20 DIAGNOSIS — M25511 Pain in right shoulder: Secondary | ICD-10-CM

## 2021-06-20 DIAGNOSIS — G8929 Other chronic pain: Secondary | ICD-10-CM

## 2021-06-20 DIAGNOSIS — R29898 Other symptoms and signs involving the musculoskeletal system: Secondary | ICD-10-CM

## 2021-06-20 NOTE — Therapy (Signed)
[place], [place], [address] Phone: [phone]   Fax:  [phone]  Occupational Therapy Treatment  Patient Details  Name: Sarah Cross MRN: [id number] Date of Birth: [birth year] Referring Provider (OT): Dr. Larena Glassman   Encounter Date: 06/20/2021   OT End of Session - 06/20/21 1205     Visit Number 4    Number of Visits 8    Date for OT Re-Evaluation 07/10/21    Authorization Type HB Medicaid    Authorization Time Period requesting 8 visits    Authorization - Visit Number 0    Authorization - Number of Visits 8    OT Start Time 1120    OT Stop Time 1200    OT Time Calculation (min) 40 min    Activity Tolerance Patient tolerated treatment well    Behavior During Therapy [facility] for tasks assessed/performed             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)    has trait   Spotting during pregnancy in first trimester 12/26/2014   Vaginal Pap smear, abnormal     Past Surgical History:  Procedure Laterality Date   BLADDER TUMOR EXCISION     CESAREAN SECTION     CESAREAN SECTION N/A 07/20/2015   Procedure: REPEAT CESAREAN SECTION;  Surgeon: Jonnie Kind, MD;  Location: [facility];  Service: Obstetrics;  Laterality: N/A;   CSF SHUNT      There were no vitals filed for this visit.   Subjective Assessment - 06/20/21 1123     Subjective  S: I was so sore after last time.    Currently in Pain? Yes    Pain Score 5     Pain Location Shoulder    Pain Orientation Right    Pain Descriptors / Indicators Dull    Pain Type Chronic pain    Pain Radiating Towards upper arm    Pain Onset More than a month ago    Pain  Frequency Constant    Aggravating Factors  use, trying to sleep    Pain Relieving Factors TENS unit, pain medication    Effect of Pain on Daily Activities max effect on ADLs    Multiple Pain Sites No                [facility] OT Assessment - 06/20/21 1123       Assessment   Medical Diagnosis right shoulder pain      Precautions   Precautions None                      OT Treatments/Exercises (OP) - 06/20/21 1123       Exercises   Exercises Shoulder      Shoulder Exercises: Supine   Protraction PROM;5 reps;AROM;10 reps    Horizontal ABduction PROM;5 reps;AROM;10 reps    External Rotation PROM;5 reps;AROM;10 reps    Internal Rotation PROM;5 reps;AROM;10 reps    Flexion PROM;5 reps;AROM;10 reps    ABduction PROM;5 reps;AROM;10 reps      Modalities   Modalities Electrical Stimulation;Moist Heat  Moist Heat Therapy   Number Minutes Moist Heat 10 Minutes    Moist Heat Location Shoulder      Electrical Stimulation   Electrical Stimulation Location right shoulder    Electrical Stimulation Action interferential    Electrical Stimulation Parameters 11.5 CV    Electrical Stimulation Goals Pain      Manual Therapy   Manual Therapy Myofascial release    Manual therapy comments Manual therapy completed prior to exercises    Myofascial Release Myofascial release and manual stretching completed to the right upper arm, uppe trapezius, and scapularis region to decrease fascial restrictions and increase joint mobility in a pain free zone.                      OT Short Term Goals - 06/12/21 1414       OT SHORT TERM GOAL #1   Title Pt will be provided with and educated on HEP to improve ability to perform ADL tasks using RUE as dominant.    Time 4    Period Weeks    Status On-going    Target Date 07/10/21      OT SHORT TERM GOAL #2   Title Pt will decrease pain in RUE to 4/10 or less to improve ability to sleep for 2+ consecutive hours without  waking due to pain.    Time 4    Period Weeks    Status On-going      OT SHORT TERM GOAL #3   Title Pt will increase RUE A/ROM to [facility] to improve ability to reach for items in overhead cabinets or shelves at work.    Time 4    Period Weeks    Status On-going      OT SHORT TERM GOAL #4   Title Pt will decrease RUE fascial restrictions to min amounts to improve mobility required for functional reaching tasks.    Time 4    Period Weeks    Status On-going      OT SHORT TERM GOAL #5   Title Pt will increase RUE strength to 4/5 or greater to improve ability to lift objects at work and at home.    Time 4    Period Weeks    Status On-going                      Plan - 06/20/21 1155     Clinical Impression Statement A: Pt reports increased soreness yesterday after session on Wednesday, unable to ease pain however did not want to take pain medication as it makes her sleepy, but OTC medication does not help. Continued with myofascial release, working on muscle knot palpated at anterior shoulder, as well as upper arm and trapezius. Pt sat up and hopped off mat to answer her phone and twisted arm on the mat increasing pain, therefore completed A/ROM in supine. ES applied for pain management, pt reporting pain at 3/10 at end of session.    Body Structure / Function / Physical Skills ADL;Endurance;Muscle spasms;UE functional use;Fascial restriction;Pain;ROM;IADL;Strength    Plan P: Continue with myofascial release an gentle passive ROM. Therapy ball stretches. End with ES and heat.    OT Home Exercise Plan eval: scapular A/ROM    Consulted and Agree with Plan of Care Patient             Patient will benefit from skilled therapeutic intervention in order to improve the following deficits and impairments:  Body Structure / Function / Physical Skills: ADL, Endurance, Muscle spasms, UE functional use, Fascial restriction, Pain, ROM, IADL, Strength       Visit Diagnosis: Other  symptoms and signs involving the musculoskeletal system  Chronic right shoulder pain    Problem List Patient Active Problem List   Diagnosis Date Noted   BV (bacterial vaginosis) 05/19/2021   Abnormal uterine bleeding (AUB) 05/19/2021   Keloid 05/19/2021   Encounter for menstrual regulation 05/19/2021   Dizzy spells 08/07/2020   Hypertension 08/07/2020   Encounter for screening fecal occult blood testing 08/07/2020   Encounter for well woman exam with routine gynecological exam 08/07/2020   Menorrhagia with irregular cycle 08/05/2018   Pregnancy examination or test, negative result 08/05/2018   Routine cervical smear 08/05/2018   Encounter for gynecological examination with Papanicolaou smear of cervix 08/05/2018   Abnormal skin growth 08/05/2018   History of DVT of lower extremity    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

## 2021-06-24 ENCOUNTER — Other Ambulatory Visit: Payer: Self-pay

## 2021-06-24 ENCOUNTER — Ambulatory Visit (HOSPITAL_COMMUNITY): Payer: Medicaid Other | Admitting: Occupational Therapy

## 2021-06-24 ENCOUNTER — Encounter (HOSPITAL_COMMUNITY): Payer: Self-pay | Admitting: Occupational Therapy

## 2021-06-24 DIAGNOSIS — G8929 Other chronic pain: Secondary | ICD-10-CM

## 2021-06-24 DIAGNOSIS — M25511 Pain in right shoulder: Secondary | ICD-10-CM | POA: Diagnosis not present

## 2021-06-24 DIAGNOSIS — R29898 Other symptoms and signs involving the musculoskeletal system: Secondary | ICD-10-CM

## 2021-06-24 NOTE — Patient Instructions (Signed)

## 2021-06-24 NOTE — Therapy (Signed)
[place], [place], [address] Phone: [phone]   Fax:  [phone]  Occupational Therapy Treatment  Patient Details  Name: Sarah Cross MRN: [id number] Date of Birth: 1980/11/08 Referring Provider (OT): Dr. Larena Glassman   Encounter Date: 06/24/2021   OT End of Session - 06/24/21 1201     Visit Number 5    Number of Visits 8    Date for OT Re-Evaluation 07/10/21    Authorization Type HB Medicaid    Authorization Time Period requesting 8 visits    Authorization - Visit Number 0    Authorization - Number of Visits 8    OT Start Time [id number]   pt arrived late   OT Stop Time 1200    OT Time Calculation (min) 29 min    Activity Tolerance Patient tolerated treatment well    Behavior During Therapy [facility] for tasks assessed/performed             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   Subjective Assessment - 06/24/21 1133     Subjective  S: Last night was rough.    Currently in Pain? Yes    Pain Score 2     Pain Location Shoulder    Pain Orientation Right    Pain Descriptors / Indicators Aching;Sore    Pain Type Chronic pain    Pain Radiating Towards upper arm    Pain Onset More than a month ago     Pain Frequency Constant    Aggravating Factors  use, rain    Pain Relieving Factors heating pad, pain medication    Effect of Pain on Daily Activities mod to max effect on ADLs    Multiple Pain Sites No                [facility] OT Assessment - 06/24/21 1133       Assessment   Medical Diagnosis right shoulder pain      Precautions   Precautions None                      OT Treatments/Exercises (OP) - 06/24/21 1134       Exercises   Exercises Shoulder      Shoulder Exercises: Supine   Protraction AAROM;10 reps    Horizontal ABduction AAROM;10 reps    External Rotation AROM;10 reps    Internal Rotation AROM;10 reps    Flexion AAROM;10 reps      Shoulder Exercises: Standing   Protraction AAROM;10 reps    Horizontal ABduction AAROM;10 reps  External Rotation AROM;10 reps    Internal Rotation AROM;10 reps    Flexion AAROM;10 reps    ABduction AAROM;10 reps      Shoulder Exercises: Pulleys   Flexion 1 minute    ABduction 1 minute                    OT Education - 06/24/21 1143     Education Details shoulder AA/ROM    Person(s) Educated Patient    Methods Explanation;Handout;Demonstration    Comprehension Verbalized understanding;Returned demonstration              OT Short Term Goals - 06/12/21 1414       OT SHORT TERM GOAL #1   Title Pt will be provided with and educated on HEP to improve ability to perform ADL tasks using RUE as dominant.    Time 4    Period Weeks    Status On-going    Target Date 07/10/21      OT SHORT TERM GOAL #2   Title Pt will decrease pain in RUE to 4/10 or less to improve ability to sleep for 2+ consecutive hours without waking due to pain.    Time 4    Period Weeks    Status On-going      OT SHORT TERM GOAL #3   Title Pt will increase RUE A/ROM to [facility] to improve ability to reach for items in overhead cabinets or shelves at work.    Time 4    Period Weeks    Status On-going      OT SHORT  TERM GOAL #4   Title Pt will decrease RUE fascial restrictions to min amounts to improve mobility required for functional reaching tasks.    Time 4    Period Weeks    Status On-going      OT SHORT TERM GOAL #5   Title Pt will increase RUE strength to 4/5 or greater to improve ability to lift objects at work and at home.    Time 4    Period Weeks    Status On-going                      Plan - 06/24/21 1140     Clinical Impression Statement A: Pt reports heat helps with the pain while she is using the heating pad, but does not help after she takes it off. Pt has been completing HEP with increased pain when finished. No manual techniques or passive stretching completed today as pt was late, however pt was able to complete more during exercises during session with less severe pain. Verbal cuing for form and technique.    Body Structure / Function / Physical Skills ADL;Endurance;Muscle spasms;UE functional use;Fascial restriction;Pain;ROM;IADL;Strength    Plan P: Continue with exercises, no manual techniques or passive ROM as this may be increasing her pain.    OT Home Exercise Plan eval: scapular A/ROM; 9/13: AA/ROM    Consulted and Agree with Plan of Care Patient             Patient will benefit from skilled therapeutic intervention in order to improve the following deficits and impairments:   Body Structure / Function / Physical Skills: ADL, Endurance, Muscle spasms, UE functional use, Fascial restriction, Pain, ROM, IADL, Strength       Visit Diagnosis: Other symptoms and signs involving the musculoskeletal system  Chronic right shoulder pain    Problem List Patient Active Problem List  Diagnosis Date Noted   BV (bacterial vaginosis) 05/19/2021   Abnormal uterine bleeding (AUB) 05/19/2021   Keloid 05/19/2021   Encounter for menstrual regulation 05/19/2021   Dizzy spells 08/07/2020   Hypertension 08/07/2020   Encounter for screening fecal occult blood  testing 08/07/2020   Encounter for well woman exam with routine gynecological exam 08/07/2020   Menorrhagia with irregular cycle 08/05/2018   Pregnancy examination or test, negative result 08/05/2018   Routine cervical smear 08/05/2018   Encounter for gynecological examination with Papanicolaou smear of cervix 08/05/2018   Abnormal skin growth 08/05/2018   History of DVT of lower extremity    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 [place], [place], [address] Phone: [phone]   Fax:  [phone]  Name: KELVINA COURTENAY MRN: [id number] Date of Birth: [birth year]

## 2021-06-25 ENCOUNTER — Encounter (HOSPITAL_COMMUNITY): Payer: Medicaid Other

## 2021-06-27 NOTE — Patient Instructions (Signed)
Sarah Cross  06/27/2021     '@PREFPERIOPPHARMACY'$ @   Your procedure is scheduled on  07/04/2021.   Report to [facility] at  [phone] A.M.   Call this number if you have problems the morning of surgery:  [phone]   Remember:  Do not eat or drink after midnight.      Take these medicines the morning of surgery with A SIP OF WATER                                      tramadol     Do not wear jewelry, make-up or nail polish.  Do not wear lotions, powders, or perfumes, or deodorant.  Do not shave 48 hours prior to surgery.  Men may shave face and neck.  Do not bring valuables to the hospital.  [facility] is not responsible for any belongings or valuables.  Contacts, dentures or bridgework may not be worn into surgery.  Leave your suitcase in the car.  After surgery it may be brought to your room.  For patients admitted to the hospital, discharge time will be determined by your treatment team.  Patients discharged the day of surgery will not be allowed to drive home and must have someone with them for 24 hours.    Special instructions:   DO NOT smoke tobacco or vape for 24 hours before your procedure.  Please read over the following fact sheets that you were given. Coughing and Deep Breathing, Surgical Site Infection Prevention, Anesthesia Post-op Instructions, and Care and Recovery After Surgery      Incision Care, Adult An incision is a cut that a doctor makes in your skin for surgery. Most times, these cuts are closed after surgery. Your cut from surgery may be closed with: Stitches (sutures). Staples. Skin glue. Skin tape (adhesive) strips. You may need to go back to your doctor to have stitches or staples taken out. This may happen many days or many weeks after your surgery. You need to take good care of your cut so it does not get infected. Follow instructions from your doctor about how to care for your cut. Supplies needed: Soap and water. A  clean hand towel. Wound cleanser. A clean bandage (dressing), if needed. Cream or ointment, if told by your doctor. Clean gauze. How to care for your cut from surgery Cleaning your cut Ask your doctor how to clean your cut. You may need to: Wear medical gloves. Use mild soap and water, or a wound cleanser. Use a clean gauze to pat your cut dry after you clean it. Changing your bandage Wash your hands with soap and water for at least 20 seconds before and after you change your bandage. If you cannot use soap and water, use hand sanitizer. Do not usedisinfectants or antiseptics, such as rubbing alcohol, to clean your wound unless told by your doctor. Change your bandage as told by your doctor. Leavestitches or skin glue in place for at least 2 weeks. Leave tape strips alone unless you are told to take them off. You may trim the edges of the tape strips if they curl up. Put a cream or ointment on your cut. Do this only as told. Cover your cut with a clean bandage. Ask your doctor when you can leave your cut uncovered. Checking for infection Check your cut area every day  for signs of infection. Check for: More redness, swelling, or pain. More fluid or blood. New warmth. Hardness or a new rash around the incision. Pus or a bad smell.  Follow these instructions at home Medicines Take over-the-counter and prescription medicines only as told by your doctor. If you were prescribed an antibiotic medicine, cream, or ointment, use it as told by your doctor. Do not stop using the antibiotic even if you start to feel better. Eating and drinking Eat foods that have a lot of certain nutrients, such as protein, vitamin A, and vitamin C. These foods help your cut heal. Foods rich in protein include meat, fish, eggs, dairy, beans, nuts, and protein drinks. Foods rich in vitamin A include carrots and dark green, leafy vegetables. Foods rich in vitamin C include citrus fruits, tomatoes, broccoli, and  peppers. Drink enough fluid to keep your pee (urine) pale yellow. General instructions  Do not take baths, swim, or use a hot tub. Ask your doctor about taking showers or sponge baths. Limit movement around your cut. This helps with healing. Try not to strain, lift, or exercise for the first 2 weeks, or for as long as told by your doctor. Return to your normal activities as told by your doctor. Ask your doctor what activities are safe for you. Do not scratch, scrub, or pick at your cut. Keep it covered as told by your doctor. Protect your cut from the sun when you are outside for the first 6 months, or for as long as told by your doctor. Cover up the scar area or put on sunscreen that has an SPF of at least 30. Do not use any products that contain nicotine or tobacco, such as cigarettes, e-cigarettes, and chewing tobacco. These can delay cut healing. If you need help quitting, ask your doctor. Keep all follow-up visits. Contact a doctor if: You have any of these signs of infection around your cut: More redness, swelling, or pain. More fluid or blood. New warmth or hardness. Pus or a bad smell. A new rash. You have a fever. You feel like you may vomit (nauseous). You vomit. You are dizzy. Your stitches, staples, skin glue, or tape strips come undone. Your cut gets bigger. You have a fever. Get help right away if: Your cut bleeds through your bandage, and bleeding does not stop with gentle pressure. Your cut opens up and comes apart. These symptoms may be an emergency. Do not wait to see if the symptoms will go away. Get medical help right away. Call your local emergency services (911 in the [place] Do not drive yourself to the hospital. Summary Follow instructions from your doctor about how to care for your cut. Wash your hands with soap and water for at least 20 seconds before and after you change your bandage. If you cannot use soap and water, use hand sanitizer. Check your cut area  every day for signs of infection. Keep all follow-up visits. This information is not intended to replace advice given to you by your health care provider. Make sure you discuss any questions you have with your health care provider. Document Revised: 12/30/2020 Document Reviewed: 12/30/2020 Elsevier Patient Education  [place] [employer] This sheet gives you information about how to care for yourself after your procedure. Your health care provider may also give you more specific instructions. If you have problems or questions, contact your health care provider. What can I expect after the procedure? After the procedure,  it is common to have: Tiredness. Forgetfulness about what happened after the procedure. Impaired judgment for important decisions. Nausea or vomiting. Some difficulty with balance. Follow these instructions at home: For the time period you were told by your health care provider:   Rest as needed. Do not participate in activities where you could fall or become injured. Do not drive or use machinery. Do not drink alcohol. Do not take sleeping pills or medicines that cause drowsiness. Do not make important decisions or sign legal documents. Do not take care of children on your own. Eating and drinking Follow the diet that is recommended by your health care provider. Drink enough fluid to keep your urine pale yellow. If you vomit: Drink water, juice, or soup when you can drink without vomiting. Make sure you have little or no nausea before eating solid foods. General instructions Have a responsible adult stay with you for the time you are told. It is important to have someone help care for you until you are awake and alert. Take over-the-counter and prescription medicines only as told by your health care provider. If you have sleep apnea, surgery and certain medicines can increase your risk for breathing problems. Follow instructions  from your health care provider about wearing your sleep device: Anytime you are sleeping, including during daytime naps. While taking prescription pain medicines, sleeping medicines, or medicines that make you drowsy. Avoid smoking. Keep all follow-up visits as told by your health care provider. This is important. Contact a health care provider if: You keep feeling nauseous or you keep vomiting. You feel light-headed. You are still sleepy or having trouble with balance after 24 hours. You develop a rash. You have a fever. You have redness or swelling around the IV site. Get help right away if: You have trouble breathing. You have new-onset confusion at home. Summary For several hours after your procedure, you may feel tired. You may also be forgetful and have poor judgment. Have a responsible adult stay with you for the time you are told. It is important to have someone help care for you until you are awake and alert. Rest as told. Do not drive or operate machinery. Do not drink alcohol or take sleeping pills. Get help right away if you have trouble breathing, or if you suddenly become confused. This information is not intended to replace advice given to you by your health care provider. Make sure you discuss any questions you have with your health care provider. Document Revised: 06/13/2020 Document Reviewed: 08/31/2019 Elsevier Patient Education  2022 [place]. How to Use Chlorhexidine for Bathing Chlorhexidine gluconate (CHG) is a germ-killing (antiseptic) solution that is used to clean the skin. It can get rid of the bacteria that normally live on the skin and can keep them away for about 24 hours. To clean your skin with CHG, you may be given: A CHG solution to use in the shower or as part of a sponge bath. A prepackaged cloth that contains CHG. Cleaning your skin with CHG may help lower the risk for infection: While you are staying in the intensive care unit of the  hospital. If you have a vascular access, such as a central line, to provide short-term or long-term access to your veins. If you have a catheter to drain urine from your bladder. If you are on a ventilator. A ventilator is a machine that helps you breathe by moving air in and out of your lungs. After surgery. What are the risks?  Risks of using CHG include: A skin reaction. Hearing loss, if CHG gets in your ears and you have a perforated eardrum. Eye injury, if CHG gets in your eyes and is not rinsed out. The CHG product catching fire. Make sure that you avoid smoking and flames after applying CHG to your skin. Do not use CHG: If you have a chlorhexidine allergy or have previously reacted to chlorhexidine. On babies younger than 69 months of age. How to use CHG solution Use CHG only as told by your health care provider, and follow the instructions on the label. Use the full amount of CHG as directed. Usually, this is one bottle. During a shower Follow these steps when using CHG solution during a shower (unless your health care provider gives you different instructions): Start the shower. Use your normal soap and shampoo to wash your face and hair. Turn off the shower or move out of the shower stream. Pour the CHG onto a clean washcloth. Do not use any type of brush or rough-edged sponge. Starting at your neck, lather your body down to your toes. Make sure you follow these instructions: If you will be having surgery, pay special attention to the part of your body where you will be having surgery. Scrub this area for at least 1 minute. Do not use CHG on your head or face. If the solution gets into your ears or eyes, rinse them well with water. Avoid your genital area. Avoid any areas of skin that have broken skin, cuts, or scrapes. Scrub your back and under your arms. Make sure to wash skin folds. Let the lather sit on your skin for 1-2 minutes or as long as told by your health care  provider. Thoroughly rinse your entire body in the shower. Make sure that all body creases and crevices are rinsed well. Dry off with a clean towel. Do not put any substances on your body afterward--such as powder, lotion, or perfume--unless you are told to do so by your health care provider. Only use lotions that are recommended by the manufacturer. Put on clean clothes or pajamas. If it is the night before your surgery, sleep in clean sheets.  During a sponge bath Follow these steps when using CHG solution during a sponge bath (unless your health care provider gives you different instructions): Use your normal soap and shampoo to wash your face and hair. Pour the CHG onto a clean washcloth. Starting at your neck, lather your body down to your toes. Make sure you follow these instructions: If you will be having surgery, pay special attention to the part of your body where you will be having surgery. Scrub this area for at least 1 minute. Do not use CHG on your head or face. If the solution gets into your ears or eyes, rinse them well with water. Avoid your genital area. Avoid any areas of skin that have broken skin, cuts, or scrapes. Scrub your back and under your arms. Make sure to wash skin folds. Let the lather sit on your skin for 1-2 minutes or as long as told by your health care provider. Using a different clean, wet washcloth, thoroughly rinse your entire body. Make sure that all body creases and crevices are rinsed well. Dry off with a clean towel. Do not put any substances on your body afterward--such as powder, lotion, or perfume--unless you are told to do so by your health care provider. Only use lotions that are recommended by the manufacturer. Put on  clean clothes or pajamas. If it is the night before your surgery, sleep in clean sheets. How to use CHG prepackaged cloths Only use CHG cloths as told by your health care provider, and follow the instructions on the label. Use the  CHG cloth on clean, dry skin. Do not use the CHG cloth on your head or face unless your health care provider tells you to. When washing with the CHG cloth: Avoid your genital area. Avoid any areas of skin that have broken skin, cuts, or scrapes. Before surgery Follow these steps when using a CHG cloth to clean before surgery (unless your health care provider gives you different instructions): Using the CHG cloth, vigorously scrub the part of your body where you will be having surgery. Scrub using a back-and-forth motion for 3 minutes. The area on your body should be completely wet with CHG when you are done scrubbing. Do not rinse. Discard the cloth and let the area air-dry. Do not put any substances on the area afterward, such as powder, lotion, or perfume. Put on clean clothes or pajamas. If it is the night before your surgery, sleep in clean sheets.  For general bathing Follow these steps when using CHG cloths for general bathing (unless your health care provider gives you different instructions). Use a separate CHG cloth for each area of your body. Make sure you wash between any folds of skin and between your fingers and toes. Wash your body in the following order, switching to a new cloth after each step: The front of your neck, shoulders, and chest. Both of your arms, under your arms, and your hands. Your stomach and groin area, avoiding the genitals. Your right leg and foot. Your left leg and foot. The back of your neck, your back, and your buttocks. Do not rinse. Discard the cloth and let the area air-dry. Do not put any substances on your body afterward--such as powder, lotion, or perfume--unless you are told to do so by your health care provider. Only use lotions that are recommended by the manufacturer. Put on clean clothes or pajamas. Contact a health care provider if: Your skin gets irritated after scrubbing. You have questions about using your solution or cloth. You swallow  any chlorhexidine. Call your local poison control center (1-(860) 208-8317 in the [place] Get help right away if: Your eyes itch badly, or they become very red or swollen. Your skin itches badly and is red or swollen. Your hearing changes. You have trouble seeing. You have swelling or tingling in your mouth or throat. You have trouble breathing. These symptoms may represent a serious problem that is an emergency. Do not wait to see if the symptoms will go away. Get medical help right away. Call your local emergency services (911 in the [place] Do not drive yourself to the hospital. Summary Chlorhexidine gluconate (CHG) is a germ-killing (antiseptic) solution that is used to clean the skin. Cleaning your skin with CHG may help to lower your risk for infection. You may be given CHG to use for bathing. It may be in a bottle or in a prepackaged cloth to use on your skin. Carefully follow your health care provider's instructions and the instructions on the product label. Do not use CHG if you have a chlorhexidine allergy. Contact your health care provider if your skin gets irritated after scrubbing. This information is not intended to replace advice given to you by your health care provider. Make sure you discuss any questions you have with your  health care provider. Document Revised: 12/09/2020 Document Reviewed: 12/09/2020 Elsevier Patient Education  2022 [employer].

## 2021-07-01 ENCOUNTER — Ambulatory Visit (HOSPITAL_COMMUNITY): Payer: Medicaid Other

## 2021-07-02 ENCOUNTER — Other Ambulatory Visit: Payer: Self-pay

## 2021-07-02 ENCOUNTER — Encounter (HOSPITAL_COMMUNITY)
Admission: RE | Admit: 2021-07-02 | Discharge: 2021-07-02 | Disposition: A | Discharge status: Home / Self Care | Payer: Medicaid Other | Source: Ambulatory Visit | Attending: General Surgery | Admitting: General Surgery

## 2021-07-02 ENCOUNTER — Ambulatory Visit (HOSPITAL_COMMUNITY): Payer: Medicaid Other

## 2021-07-02 ENCOUNTER — Encounter (HOSPITAL_COMMUNITY): Payer: Self-pay

## 2021-07-02 DIAGNOSIS — Z01818 Encounter for other preprocedural examination: Secondary | ICD-10-CM | POA: Diagnosis present

## 2021-07-02 DIAGNOSIS — M25511 Pain in right shoulder: Secondary | ICD-10-CM | POA: Diagnosis not present

## 2021-07-02 DIAGNOSIS — G8929 Other chronic pain: Secondary | ICD-10-CM

## 2021-07-02 DIAGNOSIS — R29898 Other symptoms and signs involving the musculoskeletal system: Secondary | ICD-10-CM

## 2021-07-02 HISTORY — DX: Other specified postprocedural states: Z98.890

## 2021-07-02 HISTORY — DX: Other specified postprocedural states: R11.2

## 2021-07-02 LAB — HCG, SERUM, QUALITATIVE: Preg, Serum: NEGATIVE

## 2021-07-02 LAB — CBC WITH DIFFERENTIAL/PLATELET
Abs Immature Granulocytes: 0.02 10*3/uL (ref 0.00–0.07)
Basophils Absolute: 0.1 10*3/uL (ref 0.0–0.1)
Basophils Relative: 1 %
Eosinophils Absolute: 0.1 10*3/uL (ref 0.0–0.5)
Eosinophils Relative: 1 %
HCT: 36.4 % (ref 36.0–46.0)
Hemoglobin: 11.7 g/dL — ABNORMAL LOW (ref 12.0–15.0)
Immature Granulocytes: 0 %
Lymphocytes Relative: 25 %
Lymphs Abs: 1.9 10*3/uL (ref 0.7–4.0)
MCH: 25.8 pg — ABNORMAL LOW (ref 26.0–34.0)
MCHC: 32.1 g/dL (ref 30.0–36.0)
MCV: 80.2 fL (ref 80.0–100.0)
Monocytes Absolute: 0.6 10*3/uL (ref 0.1–1.0)
Monocytes Relative: 7 %
Neutro Abs: 5.2 10*3/uL (ref 1.7–7.7)
Neutrophils Relative %: 66 %
Platelets: 304 10*3/uL (ref 150–400)
RBC: 4.54 MIL/uL (ref 3.87–5.11)
RDW: 14.8 % (ref 11.5–15.5)
WBC: 7.9 10*3/uL (ref 4.0–10.5)
nRBC: 0 % (ref 0.0–0.2)

## 2021-07-04 ENCOUNTER — Ambulatory Visit (HOSPITAL_COMMUNITY)
Admission: RE | Admit: 2021-07-04 | Discharge: 2021-07-04 | Disposition: A | Discharge status: Home / Self Care | Payer: Medicaid Other | Attending: General Surgery | Admitting: General Surgery

## 2021-07-04 ENCOUNTER — Encounter (HOSPITAL_COMMUNITY)
Admission: RE | Disposition: A | Discharge status: Home / Self Care | Payer: Self-pay | Source: Home / Self Care | Attending: General Surgery

## 2021-07-04 ENCOUNTER — Encounter (HOSPITAL_COMMUNITY): Payer: Self-pay | Admitting: General Surgery

## 2021-07-04 ENCOUNTER — Ambulatory Visit (HOSPITAL_COMMUNITY): Payer: Medicaid Other | Admitting: Certified Registered"

## 2021-07-04 DIAGNOSIS — N806 Endometriosis in cutaneous scar: Secondary | ICD-10-CM | POA: Insufficient documentation

## 2021-07-04 DIAGNOSIS — N809 Endometriosis, unspecified: Secondary | ICD-10-CM | POA: Diagnosis not present

## 2021-07-04 DIAGNOSIS — D492 Neoplasm of unspecified behavior of bone, soft tissue, and skin: Secondary | ICD-10-CM | POA: Diagnosis present

## 2021-07-04 DIAGNOSIS — N80129 Deep endometriosis of ovary, unspecified ovary: Secondary | ICD-10-CM

## 2021-07-04 HISTORY — PX: MASS EXCISION: SHX2000

## 2021-07-04 SURGERY — EXCISION MASS
Anesthesia: General | Site: Abdomen

## 2021-07-04 MED ORDER — CHLORHEXIDINE GLUCONATE CLOTH 2 % EX PADS: 6.0000 | MEDICATED_PAD | Freq: Once | CUTANEOUS | Status: DC

## 2021-07-04 MED ORDER — PROPOFOL 10 MG/ML IV BOLUS
INTRAVENOUS | Status: AC
  Filled 2021-07-04: qty 20

## 2021-07-04 MED ORDER — ONDANSETRON HCL 4 MG/2ML IJ SOLN
INTRAMUSCULAR | Status: DC | PRN
  Administered 2021-07-04: 4 mg via INTRAVENOUS

## 2021-07-04 MED ORDER — LIDOCAINE HCL (CARDIAC) PF 100 MG/5ML IV SOSY
PREFILLED_SYRINGE | INTRAVENOUS | Status: DC | PRN
Start: 2021-07-04 — End: 2021-07-04
  Administered 2021-07-04: 100 mg via INTRAVENOUS

## 2021-07-04 MED ORDER — ONDANSETRON HCL 4 MG/2ML IJ SOLN
INTRAMUSCULAR | Status: AC
  Filled 2021-07-04: qty 2

## 2021-07-04 MED ORDER — KETOROLAC TROMETHAMINE 30 MG/ML IJ SOLN
30.0000 mg | Freq: Once | INTRAMUSCULAR | Status: AC
  Administered 2021-07-04: 30 mg via INTRAVENOUS
  Filled 2021-07-04: qty 1

## 2021-07-04 MED ORDER — LACTATED RINGERS IV SOLN
INTRAVENOUS | Status: DC
  Administered 2021-07-04: 1000 mL via INTRAVENOUS

## 2021-07-04 MED ORDER — FENTANYL CITRATE (PF) 100 MCG/2ML IJ SOLN
INTRAMUSCULAR | Status: DC | PRN
  Administered 2021-07-04 (×5): 25 ug via INTRAVENOUS
  Administered 2021-07-04: 50 ug via INTRAVENOUS
  Administered 2021-07-04: 25 ug via INTRAVENOUS

## 2021-07-04 MED ORDER — MIDAZOLAM HCL 2 MG/2ML IJ SOLN
INTRAMUSCULAR | Status: AC
  Filled 2021-07-04: qty 2

## 2021-07-04 MED ORDER — DEXAMETHASONE SODIUM PHOSPHATE 4 MG/ML IJ SOLN
INTRAMUSCULAR | Status: DC | PRN
  Administered 2021-07-04: 8 mg via INTRAVENOUS

## 2021-07-04 MED ORDER — FENTANYL CITRATE (PF) 100 MCG/2ML IJ SOLN
INTRAMUSCULAR | Status: AC
  Filled 2021-07-04: qty 2

## 2021-07-04 MED ORDER — CEFAZOLIN SODIUM-DEXTROSE 2-4 GM/100ML-% IV SOLN
2.0000 g | INTRAVENOUS | Status: AC
Start: 2021-07-04 — End: 2021-07-04
  Administered 2021-07-04: 2 g via INTRAVENOUS

## 2021-07-04 MED ORDER — ORAL CARE MOUTH RINSE: 15.0000 mL | Freq: Once | OROMUCOSAL | Status: AC

## 2021-07-04 MED ORDER — CHLORHEXIDINE GLUCONATE 0.12 % MT SOLN
15.0000 mL | Freq: Once | OROMUCOSAL | Status: AC
  Administered 2021-07-04: 15 mL via OROMUCOSAL

## 2021-07-04 MED ORDER — BUPIVACAINE HCL (PF) 0.5 % IJ SOLN
INTRAMUSCULAR | Status: AC
  Filled 2021-07-04: qty 30

## 2021-07-04 MED ORDER — DEXAMETHASONE SODIUM PHOSPHATE 10 MG/ML IJ SOLN
INTRAMUSCULAR | Status: AC
  Filled 2021-07-04: qty 1

## 2021-07-04 MED ORDER — ONDANSETRON HCL 4 MG/2ML IJ SOLN: 4.0000 mg | Freq: Once | INTRAMUSCULAR | Status: DC | PRN

## 2021-07-04 MED ORDER — HYDROCODONE-ACETAMINOPHEN 5-325 MG PO TABS: 1.0000 | ORAL_TABLET | ORAL | 0 refills | Status: DC | PRN

## 2021-07-04 MED ORDER — PROPOFOL 10 MG/ML IV BOLUS
INTRAVENOUS | Status: DC | PRN
  Administered 2021-07-04: 200 mg via INTRAVENOUS

## 2021-07-04 MED ORDER — CEFAZOLIN SODIUM-DEXTROSE 2-4 GM/100ML-% IV SOLN
INTRAVENOUS | Status: AC
  Filled 2021-07-04: qty 100

## 2021-07-04 MED ORDER — 0.9 % SODIUM CHLORIDE (POUR BTL) OPTIME
TOPICAL | Status: DC | PRN
  Administered 2021-07-04: 1000 mL

## 2021-07-04 MED ORDER — MIDAZOLAM HCL 5 MG/5ML IJ SOLN
INTRAMUSCULAR | Status: DC | PRN
  Administered 2021-07-04: 2 mg via INTRAVENOUS

## 2021-07-04 MED ORDER — POVIDONE-IODINE 10 % EX OINT
TOPICAL_OINTMENT | CUTANEOUS | Status: AC
  Filled 2021-07-04: qty 1

## 2021-07-04 MED ORDER — FENTANYL CITRATE PF 50 MCG/ML IJ SOSY: 25.0000 ug | PREFILLED_SYRINGE | INTRAMUSCULAR | Status: DC | PRN

## 2021-07-04 MED ORDER — LIDOCAINE HCL (PF) 2 % IJ SOLN
INTRAMUSCULAR | Status: AC
  Filled 2021-07-04: qty 5

## 2021-07-04 MED ORDER — METOCLOPRAMIDE HCL 5 MG/ML IJ SOLN
INTRAMUSCULAR | Status: AC
  Filled 2021-07-04: qty 2

## 2021-07-04 MED ORDER — METOCLOPRAMIDE HCL 5 MG/ML IJ SOLN
INTRAMUSCULAR | Status: DC | PRN
  Administered 2021-07-04: 10 mg via INTRAVENOUS

## 2021-07-04 MED ORDER — BUPIVACAINE HCL (PF) 0.5 % IJ SOLN
INTRAMUSCULAR | Status: DC | PRN
  Administered 2021-07-04: 10 mL

## 2021-07-04 SURGICAL SUPPLY — 24 items
APL PRP STRL LF ISPRP CHG 10.5 (MISCELLANEOUS) ×1
APPLICATOR CHLORAPREP 10.5 ORG (MISCELLANEOUS) ×2 IMPLANT
CLOTH BEACON ORANGE TIMEOUT ST (SAFETY) ×2 IMPLANT
COVER LIGHT HANDLE STERIS (MISCELLANEOUS) ×4 IMPLANT
ELECT REM PT RETURN 9FT ADLT (ELECTROSURGICAL) ×2
ELECTRODE REM PT RTRN 9FT ADLT (ELECTROSURGICAL) ×1 IMPLANT
GAUZE 4X4 16PLY ~~LOC~~+RFID DBL (SPONGE) ×1 IMPLANT
GAUZE SPONGE 4X4 12PLY STRL (GAUZE/BANDAGES/DRESSINGS) ×2 IMPLANT
GLOVE SURG POLYISO LF SZ7.5 (GLOVE) ×2 IMPLANT
GLOVE SURG UNDER POLY LF SZ7 (GLOVE) ×4 IMPLANT
GOWN STRL REUS W/TWL LRG LVL3 (GOWN DISPOSABLE) ×4 IMPLANT
KIT TURNOVER KIT A (KITS) ×2 IMPLANT
MANIFOLD NEPTUNE II (INSTRUMENTS) ×2 IMPLANT
NDL HYPO 25X1 1.5 SAFETY (NEEDLE) ×1 IMPLANT
NEEDLE HYPO 25X1 1.5 SAFETY (NEEDLE) ×2 IMPLANT
NS IRRIG 1000ML POUR BTL (IV SOLUTION) ×2 IMPLANT
PACK MINOR (CUSTOM PROCEDURE TRAY) ×2 IMPLANT
PAD ARMBOARD 7.5X6 YLW CONV (MISCELLANEOUS) ×2 IMPLANT
SET BASIN LINEN APH (SET/KITS/TRAYS/PACK) ×2 IMPLANT
STAPLER VISISTAT (STAPLE) ×1 IMPLANT
SUT ETHIBOND NAB MO 7 #0 18IN (SUTURE) ×1 IMPLANT
SUT VIC AB 3-0 SH 27 (SUTURE) ×2
SUT VIC AB 3-0 SH 27X BRD (SUTURE) IMPLANT
SYR CONTROL 10ML LL (SYRINGE) ×2 IMPLANT

## 2021-07-04 NOTE — Anesthesia Preprocedure Evaluation (Signed)
Anesthesia Evaluation  Patient identified by MRN, date of birth, ID band Patient awake    Reviewed: Allergy & Precautions, H&P , NPO status , Patient's Chart, lab work & pertinent test results, reviewed documented beta blocker date and time   History of Anesthesia Complications (+) PONV and history of anesthetic complications  Airway Mallampati: II  TM Distance: >3 FB Neck ROM: full    Dental no notable dental hx.    Pulmonary neg pulmonary ROS,    Pulmonary exam normal breath sounds clear to auscultation       Cardiovascular Exercise Tolerance: Good hypertension, negative cardio ROS   Rhythm:regular Rate:Normal     Neuro/Psych negative neurological ROS  negative psych ROS   GI/Hepatic negative GI ROS, Neg liver ROS,   Endo/Other  negative endocrine ROS  Renal/GU negative Renal ROS  negative genitourinary   Musculoskeletal   Abdominal   Peds  Hematology negative hematology ROS (+)   Anesthesia Other Findings   Reproductive/Obstetrics negative OB ROS                             Anesthesia Physical Anesthesia Plan  ASA: 2  Anesthesia Plan: General   Post-op Pain Management:    Induction:   PONV Risk Score and Plan: Ondansetron  Airway Management Planned:   Additional Equipment:   Intra-op Plan:   Post-operative Plan:   Informed Consent: I have reviewed the patients History and Physical, chart, labs and discussed the procedure including the risks, benefits and alternatives for the proposed anesthesia with the patient or authorized representative who has indicated his/her understanding and acceptance.     Dental Advisory Given  Plan Discussed with: CRNA  Anesthesia Plan Comments:         Anesthesia Quick Evaluation

## 2021-07-04 NOTE — Op Note (Signed)
Patient:  Sarah Cross  DOB:  1981/04/03  MRN:  [id number]   Preop Diagnosis: Skin neoplasm, umbilicus/abdominal wall  Postop Diagnosis: Same  Procedure: Excision of skin neoplasm, umbilicus/abdominal wall  Surgeon: Aviva Signs, MD  Anes: General  Indications: Patient is a 40 year old black female who was referred to my care for skin lesion on the abdominal wall at the umbilicus.  The risks and benefits of the procedure including bleeding, infection, and the possibility of malignancy were fully explained to the patient, who gave informed consent.  Procedure note: The patient was placed in the supine position.  After general anesthesia was administered, the abdomen was prepped and draped using the usual sterile technique with ChloraPrep.  Surgical site confirmation was performed.  An elliptical incision was made around the lesion which enveloped the umbilicus.  The dissection was taken down to the abdominal wall.  I did excise the lesion at the fascial level.  It was sent to pathology further examination.  A small 0.75 cm defect in the fascia was present.  The fascia was closed using 0 Ethibond interrupted sutures.  The subcutaneous layer was reapproximated using 3-0 Vicryl interrupted sutures.  0.5% Sensorcaine was instilled into the surrounding wound.  The skin was closed using staples.  Total length of incision 7cm.  Betadine ointment and dry sterile dressing were applied.  All tape and needle counts were correct at the end of the procedure.  The patient was awakened and transferred to PACU in stable condition.  Complications: None  EBL: Minimal  Specimen: Umbilical/abdominal wall skin lesion  Media Information Document Information  Photos    06/10/2021 15:07  Attached To:  Office Visit on 06/10/21 with Aviva Signs, MD   Source Information  Aviva Signs, MD  [facility]

## 2021-07-04 NOTE — Anesthesia Procedure Notes (Signed)
Procedure Name: LMA Insertion Date/Time: 07/04/2021 7:30 AM Performed by: Tacy Learn, CRNA Pre-anesthesia Checklist: Patient identified, Emergency Drugs available, Suction available, Patient being monitored and Timeout performed Patient Re-evaluated:Patient Re-evaluated prior to induction Oxygen Delivery Method: Circle system utilized Preoxygenation: Pre-oxygenation with 100% oxygen Induction Type: IV induction LMA: LMA inserted LMA Size: 4.0 Number of attempts: 1 Placement Confirmation: positive ETCO2, CO2 detector and breath sounds checked- equal and bilateral Tube secured with: Tape Dental Injury: Teeth and Oropharynx as per pre-operative assessment

## 2021-07-04 NOTE — Transfer of Care (Signed)
Immediate Anesthesia Transfer of Care Note  Patient: Sarah Cross  Procedure(s) Performed: EXCISION MASS; ABDOMEN (Abdomen)  Patient Location: PACU  Anesthesia Type:General  Level of Consciousness: drowsy, patient cooperative and responds to stimulation  Airway & Oxygen Therapy: Patient Spontanous Breathing  Post-op Assessment: Report given to RN, Post -op Vital signs reviewed and stable and Patient moving all extremities X 4  Post vital signs: Reviewed and stable  Last Vitals:  Vitals Value Taken Time  BP 91/60 07/04/21 0807  Temp    Pulse    Resp 11 07/04/21 0808  SpO2      Last Pain:  Vitals:   07/04/21 0646  TempSrc: Oral  PainSc: 0-No pain      Patients Stated Pain Goal: 5 (46/21/94 7125)  Complications: No notable events documented.

## 2021-07-04 NOTE — Anesthesia Postprocedure Evaluation (Signed)
Anesthesia Post Note  Patient: Environmental health practitioner  Procedure(s) Performed: EXCISION MASS; ABDOMEN (Abdomen)  Patient location during evaluation: Phase II Anesthesia Type: General Level of consciousness: awake Pain management: pain level controlled Vital Signs Assessment: post-procedure vital signs reviewed and stable Respiratory status: spontaneous breathing and respiratory function stable Cardiovascular status: blood pressure returned to baseline and stable Postop Assessment: no headache and no apparent nausea or vomiting Anesthetic complications: no Comments: Late entry   No notable events documented.   Last Vitals:  Vitals:   07/04/21 0845 07/04/21 0850  BP: 122/80 (!) 142/89  Pulse:  62  Resp:  18  Temp:  36.6 C  SpO2: 100% 99%    Last Pain:  Vitals:   07/04/21 0850  TempSrc: Oral  PainSc: [place]

## 2021-07-04 NOTE — Interval H&P Note (Signed)
History and Physical Interval Note:  07/04/2021 7:22 AM  [name]  has presented today for surgery, with the diagnosis of Skin neoplasm.  The various methods of treatment have been discussed with the patient and family. After consideration of risks, benefits and other options for treatment, the patient has consented to  Procedure(s): EXCISION MASS; ABDOMEN (N/A) as a surgical intervention.  The patient's history has been reviewed, patient examined, no change in status, stable for surgery.  I have reviewed the patient's chart and labs.  Questions were answered to the patient's satisfaction.     Aviva Signs

## 2021-07-07 ENCOUNTER — Encounter (HOSPITAL_COMMUNITY): Payer: Self-pay | Admitting: General Surgery

## 2021-07-07 ENCOUNTER — Ambulatory Visit (HOSPITAL_COMMUNITY): Payer: Medicaid Other | Attending: Orthopedic Surgery

## 2021-07-07 LAB — SURGICAL PATHOLOGY

## 2021-07-08 ENCOUNTER — Ambulatory Visit (HOSPITAL_COMMUNITY): Payer: Medicaid Other | Admitting: Occupational Therapy

## 2021-07-08 ENCOUNTER — Encounter (HOSPITAL_COMMUNITY): Payer: Self-pay

## 2021-07-08 NOTE — Therapy (Addendum)
Sarah Cross, [place], [address] Phone: [phone]   Fax:  [phone]  Occupational Therapy Treatment  Patient Details  Name: Sarah Cross MRN: [id number] Date of Birth: 1981-06-28 Referring Provider (OT): Dr. Larena Glassman   Encounter Date: 07/02/2021   OT End of Session - 07/08/21 1648     Visit Number 6    Number of Visits 8    Date for OT Re-Evaluation 07/10/21    Authorization Type [place] Medicaid    Authorization Time Period Approved 8 visits (06/12/21-07/10/21)    Authorization - Visit Number 5    Authorization - Number of Visits 8    OT Start Time 1430    OT Stop Time 1508    OT Time Calculation (min) 38 min    Activity Tolerance Patient tolerated treatment well    Behavior During Therapy [facility] for tasks assessed/performed             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   Hydrocephalus in newborn [facility])    Hypertension    Nausea 12/26/2014   PONV (postoperative nausea and vomiting)    Pregnant 12/26/2014   Sickle cell anemia ([place])    has trait   Spotting during pregnancy in first trimester 12/26/2014   Vaginal Pap smear, abnormal     Past Surgical History:  Procedure Laterality Date   BLADDER TUMOR EXCISION     CESAREAN SECTION     CESAREAN SECTION N/A 07/20/2015   Procedure: REPEAT CESAREAN SECTION;  Surgeon: Jonnie Kind, MD;  Location: [facility];  Service: Obstetrics;  Laterality: N/A;   CSF SHUNT     MASS EXCISION N/A 07/04/2021   Procedure: EXCISION MASS; ABDOMEN;  Surgeon: Aviva Signs, MD;  Location: [facility];  Service: General;  Laterality: N/A;    There were no vitals filed for this visit.   Subjective Assessment - 07/08/21 1644     Subjective  S: It still hurts alot.    Currently in Pain? Yes    Pain Score 3      Pain Location Shoulder    Pain Orientation Right    Pain Descriptors / Indicators Aching;Sore    Pain Type Acute pain    Pain Radiating Towards upper arm    Pain Onset More than a month ago    Pain Frequency Constant    Aggravating Factors  use, wrong movement    Pain Relieving Factors heating pad, pain medication    Effect of Pain on Daily Activities max effect    Multiple Pain Sites No                [facility] OT Assessment - 07/08/21 1645       Assessment   Medical Diagnosis right shoulder pain      Precautions   Precautions None      Observation/Other Assessments   Observations Chandeliar test +, Hawkins test +, Neer's test +, Belly press -                      OT Treatments/Exercises (OP) - 07/08/21 1645       Exercises   Exercises Shoulder      Shoulder Exercises: Supine   Protraction AROM;10  reps    Horizontal ABduction AAROM;10 reps    External Rotation AROM;10 reps    Internal Rotation AROM;10 reps    Flexion AROM;10 reps;Limitations    Flexion Limitations to shoulder level                    OT Education - 07/08/21 1647     Education Details Discussed what occurs during a shoulder impingement and what treatment consists of. Discussed what occurs during a partial RTC tear and typical treatment versus a complete tear    Person(s) Educated Patient    Methods Explanation    Comprehension Verbalized understanding              OT Short Term Goals - 06/12/21 1414       OT SHORT TERM GOAL #1   Title Pt will be provided with and educated on HEP to improve ability to perform ADL tasks using RUE as dominant.    Time 4    Period Weeks    Status On-going    Target Date 07/10/21      OT SHORT TERM GOAL #2   Title Pt will decrease pain in RUE to 4/10 or less to improve ability to sleep for 2+ consecutive hours without waking due to pain.    Time 4    Period Weeks    Status On-going      OT SHORT TERM GOAL #3   Title Pt  will increase RUE A/ROM to [facility] to improve ability to reach for items in overhead cabinets or shelves at work.    Time 4    Period Weeks    Status On-going      OT SHORT TERM GOAL #4   Title Pt will decrease RUE fascial restrictions to min amounts to improve mobility required for functional reaching tasks.    Time 4    Period Weeks    Status On-going      OT SHORT TERM GOAL #5   Title Pt will increase RUE strength to 4/5 or greater to improve ability to lift objects at work and at home.    Time 4    Period Weeks    Status On-going                      Plan - 07/08/21 1649     Clinical Impression Statement A: Pt continues to experience a high level of pain in her right UE. No manual therapy or passive ROM was completed this session. Increased time and rest breaks were needed to complete. OT completed several assessments during session and patient had questions regarding a shoulder impingement and RTC tear and what the treatment is. Assessments suggest possible shoulder impingement. MRI scheduled for 9/26.    Body Structure / Function / Physical Skills ADL;Endurance;Muscle spasms;UE functional use;Fascial restriction;Pain;ROM;IADL;Strength    Plan P: Follow up on MRI. Complete reassessment. D/C may be needed until patient has a follow up with MD regarding results. Remain below 90 degrees with shoulder movements. No internal rotation. Complete parascapular strengthening.    Consulted and Agree with Plan of Care Patient             Patient will benefit from skilled therapeutic intervention in order to improve the following deficits and impairments:   Body Structure / Function / Physical Skills: ADL, Endurance, Muscle spasms, UE functional use, Fascial restriction, Pain, ROM, IADL, Strength       Visit Diagnosis: Chronic right  shoulder pain  Other symptoms and signs involving the musculoskeletal system    Problem List Patient Active Problem List   Diagnosis Date  Noted   BV (bacterial vaginosis) 05/19/2021   Abnormal uterine bleeding (AUB) 05/19/2021   Keloid 05/19/2021   Encounter for menstrual regulation 05/19/2021   Dizzy spells 08/07/2020   Hypertension 08/07/2020   Encounter for screening fecal occult blood testing 08/07/2020   Encounter for well woman exam with routine gynecological exam 08/07/2020   Menorrhagia with irregular cycle 08/05/2018   Pregnancy examination or test, negative result 08/05/2018   Routine cervical smear 08/05/2018   Encounter for gynecological examination with Papanicolaou smear of cervix 08/05/2018   Abnormal skin growth 08/05/2018   History of DVT of lower extremity    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 07/12/2015   Ovarian cyst, left 01/28/2015   Gonorrhea affecting pregnancy in first trimester, antepartum 01/19/2015   Sickle cell trait ([place]) 01/19/2015   H/O cesarean section complicating pregnancy 10/04/17   Nausea 12/26/2014   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 Patient goals were not met. Patient is being discharged due to did not respond to therapy.  MRI scheduled and surgery will likely be needed.

## 2021-07-10 ENCOUNTER — Telehealth (INDEPENDENT_AMBULATORY_CARE_PROVIDER_SITE_OTHER): Payer: Medicaid Other | Admitting: General Surgery

## 2021-07-10 ENCOUNTER — Ambulatory Visit (HOSPITAL_COMMUNITY): Payer: Medicaid Other

## 2021-07-10 DIAGNOSIS — Z09 Encounter for follow-up examination after completed treatment for conditions other than malignant neoplasm: Secondary | ICD-10-CM

## 2021-07-10 NOTE — Telephone Encounter (Signed)
Virtual telephone visit performed with patient.  She states she is doing very well.  She has no incisional pain.  I told her the final pathology result was an endometrioma.  Patient has been scheduled to see me in my office on 07/17/2021 to remove her staples.  As this was a part of the global surgical fee, this was not a billable visit.  Total telephone time was 3 minutes.

## 2021-07-15 ENCOUNTER — Encounter: Payer: Self-pay | Admitting: General Surgery

## 2021-07-15 ENCOUNTER — Ambulatory Visit (INDEPENDENT_AMBULATORY_CARE_PROVIDER_SITE_OTHER): Payer: Medicaid Other | Admitting: General Surgery

## 2021-07-15 ENCOUNTER — Other Ambulatory Visit: Payer: Self-pay

## 2021-07-15 VITALS — BP 143/82 | HR 56 | Temp 98.6°F | Resp 12 | Ht 62.0 in | Wt 155.0 lb

## 2021-07-15 DIAGNOSIS — Z09 Encounter for follow-up examination after completed treatment for conditions other than malignant neoplasm: Secondary | ICD-10-CM

## 2021-07-15 NOTE — Progress Notes (Signed)
Subjective:     Sarah Cross  Patient here for postoperative visit, status post excision of endometrioma of the abdominal wall at the umbilicus.  She states she is doing well.  She has had some serous drainage from her incision.  She denies any fever or chills.  She feels much better. Objective:    BP (!) 143/82   Pulse (!) 56   Temp 98.6 F (37 C) (Other (Comment))   Resp 12   Ht 5\' 2"  (1.575 m)   Wt 155 lb (70.3 kg)   LMP 06/18/2021   SpO2 99%   BMI 28.35 kg/m   General:  alert, cooperative, and no distress  Abdomen is soft, incision healing well with some areas of serous drainage.  One half of staples removed. Final pathology did reveal an endometrioma for which I have already notified the patient.     Assessment:    Doing well postoperatively.    Plan:   Keep wound clean and dry daily.  Follow-up in 1 week for wound check.

## 2021-07-17 ENCOUNTER — Encounter: Payer: Medicaid Other | Admitting: General Surgery

## 2021-07-22 ENCOUNTER — Other Ambulatory Visit: Payer: Self-pay

## 2021-07-22 ENCOUNTER — Ambulatory Visit (INDEPENDENT_AMBULATORY_CARE_PROVIDER_SITE_OTHER): Payer: Medicaid Other | Admitting: General Surgery

## 2021-07-22 ENCOUNTER — Encounter: Payer: Self-pay | Admitting: General Surgery

## 2021-07-22 VITALS — BP 151/88 | HR 64 | Temp 98.1°F | Resp 16 | Ht 62.0 in | Wt 156.0 lb

## 2021-07-22 DIAGNOSIS — Z09 Encounter for follow-up examination after completed treatment for conditions other than malignant neoplasm: Secondary | ICD-10-CM

## 2021-07-22 NOTE — Progress Notes (Signed)
Subjective:     Sarah Cross  Patient here for postoperative wound check.  She has no complaints.  No drainage has been noted from the wound. Objective:    BP (!) 151/88   Pulse 64   Temp 98.1 F (36.7 C) (Other (Comment))   Resp 16   Ht 5\' 2"  (1.575 m)   Wt 156 lb (70.8 kg)   SpO2 99%   BMI 28.53 kg/m   General:  alert, cooperative, and no distress  Abdomen soft, incision healing well.  Staples removed, Steri-Strips applied.     Assessment:    Doing well postoperatively.    Plan:   May resume normal activity.  Follow-up here as needed.

## 2021-07-24 ENCOUNTER — Other Ambulatory Visit: Payer: Self-pay

## 2021-07-24 ENCOUNTER — Ambulatory Visit (HOSPITAL_COMMUNITY)
Admission: RE | Admit: 2021-07-24 | Discharge: 2021-07-24 | Disposition: A | Discharge status: Home / Self Care | Payer: Medicaid Other | Source: Ambulatory Visit | Attending: Orthopedic Surgery | Admitting: Orthopedic Surgery

## 2021-07-24 DIAGNOSIS — M25611 Stiffness of right shoulder, not elsewhere classified: Secondary | ICD-10-CM | POA: Diagnosis not present

## 2021-07-30 ENCOUNTER — Ambulatory Visit (INDEPENDENT_AMBULATORY_CARE_PROVIDER_SITE_OTHER): Payer: Medicaid Other | Admitting: Orthopedic Surgery

## 2021-07-30 ENCOUNTER — Other Ambulatory Visit: Payer: Self-pay

## 2021-07-30 ENCOUNTER — Encounter: Payer: Self-pay | Admitting: Orthopedic Surgery

## 2021-07-30 VITALS — BP 161/88 | HR 70 | Ht 62.0 in | Wt 158.4 lb

## 2021-07-30 DIAGNOSIS — S46011D Strain of muscle(s) and tendon(s) of the rotator cuff of right shoulder, subsequent encounter: Secondary | ICD-10-CM | POA: Diagnosis not present

## 2021-07-31 ENCOUNTER — Encounter: Payer: Self-pay | Admitting: Orthopedic Surgery
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
## 2021-08-13 ENCOUNTER — Ambulatory Visit (INDEPENDENT_AMBULATORY_CARE_PROVIDER_SITE_OTHER): Payer: Medicaid Other | Admitting: Orthopedic Surgery

## 2021-08-13 ENCOUNTER — Other Ambulatory Visit: Payer: Self-pay

## 2021-08-13 ENCOUNTER — Encounter: Payer: Self-pay | Admitting: Orthopedic Surgery

## 2021-08-13 DIAGNOSIS — Z01818 Encounter for other preprocedural examination: Secondary | ICD-10-CM | POA: Diagnosis not present

## 2021-08-13 NOTE — Progress Notes (Signed)
Orthopaedic Clinic Return - Virtual Telephone Visit   **Visit was conducted via telephone at the patient's request.  No vital signs or physical exam were completed.  All previous medical records were readily available during my conversation with the patient.  In total, I was on the phone with the patient for 15 minutes**   Location: Patient: Home Provider: Clinic  [phone] - 11-20 minutes   Assessment: Sarah Cross is a 40 y.o. female with the following: Right shoulder pain with superior labral tear, paralabral cyst, irritation of the biceps tendon and high-grade partial tear of the anterior rotator cuff  Plan: Patient continues to have pain, primarily in the anterior aspect of her shoulder.  This worsens at night.  Once again, we reviewed the MRI results in clinic.  All questions were answered.  She is interested in pursuing surgery.  She would like to proceed on October 02, 2021.  Case will be posted for right shoulder arthroscopy, with superior labral repair, possible biceps tenodesis and possible rotator cuff.   Follow-up: Return for After surgery.   Subjective:  Chief Complaint  Patient presents with   right shoulder pain     History of Present Illness: I connected with  Sarah Cross on 08/13/21 by a telephone enabled telemedicine application and verified that I am speaking with the correct person using two identifiers.   I discussed the limitations of evaluation and management by telemedicine. The patient expressed understanding and agreed to proceed.   Sarah Cross is a 40 y.o. female who presents with right shoulder pain.  At the last clinical visit, we reviewed the results of her right shoulder MRI which does demonstrate partial-thickness tearing of the anterior rotator cuff, as well as irritation of the biceps tendon, and a superior, posterior labral tear with paralabral cyst.  Surgery was offered to her at that appointment.  However, she was  overwhelmed in clinic, and wanted to discuss this further at a later date.  She continues to have pain in the right shoulder.  Pain is throughout, but she notes pain in the superior posterior aspect of her shoulder, as well as the anterior aspect of her shoulder.  Pain gets worse at night.  Medications did not improve her pain.  Physical therapy was also insufficient at providing sustained relief.  She is interested in pursuing surgery at this time.  Review of Systems: No fevers or chills No numbness or tingling No chest pain No shortness of breath No bowel or bladder dysfunction No GI distress No headaches   Objective: No vital signs  Physical Exam:  Telephone consult - No exam   IMAGING: I personally ordered and reviewed the following images:  No new imaging today  Mordecai Rasmussen, MD 08/13/2021 11:00 PM

## 2021-08-19 ENCOUNTER — Other Ambulatory Visit: Payer: Medicaid Other | Admitting: Adult Health

## 2021-09-26 NOTE — Patient Instructions (Signed)
Your procedure is scheduled on: 10/02/2021  Report to [facility] at    6:15 AM.  Call this number if you have problems the morning of surgery: [phone]   Remember:   Do not Eat or Drink after midnight         No Smoking the morning of surgery  :  Take these medicines the morning of surgery with A SIP OF WATER: none   Do not wear jewelry, make-up or nail polish.  Do not wear lotions, powders, or perfumes. You may wear deodorant.  Do not shave 48 hours prior to surgery. Men may shave face and neck.  Do not bring valuables to the hospital.  Contacts, dentures or bridgework may not be worn into surgery.  Leave suitcase in the car. After surgery it may be brought to your room.  For patients admitted to the hospital, checkout time is 11:00 AM the day of discharge.   Patients discharged the day of surgery will not be allowed to drive home.    Special Instructions: Shower using CHG night before surgery and shower the day of surgery use CHG.  Use special wash - you have one bottle of CHG for all showers.  You should use approximately 1/2 of the bottle for each shower.  How to Use Chlorhexidine for Bathing Chlorhexidine gluconate (CHG) is a germ-killing (antiseptic) solution that is used to clean the skin. It can get rid of the bacteria that normally live on the skin and can keep them away for about 24 hours. To clean your skin with CHG, you may be given: A CHG solution to use in the shower or as part of a sponge bath. A prepackaged cloth that contains CHG. Cleaning your skin with CHG may help lower the risk for infection: While you are staying in the intensive care unit of the hospital. If you have a vascular access, such as a central line, to provide short-term or long-term access to your veins. If you have a catheter to drain urine from your bladder. If you are on a ventilator. A ventilator is a machine that helps you breathe by moving air in and out of your lungs. After  surgery. What are the risks? Risks of using CHG include: A skin reaction. Hearing loss, if CHG gets in your ears and you have a perforated eardrum. Eye injury, if CHG gets in your eyes and is not rinsed out. The CHG product catching fire. Make sure that you avoid smoking and flames after applying CHG to your skin. Do not use CHG: If you have a chlorhexidine allergy or have previously reacted to chlorhexidine. On babies younger than 75 months of age. How to use CHG solution Use CHG only as told by your health care provider, and follow the instructions on the label. Use the full amount of CHG as directed. Usually, this is one bottle. During a shower Follow these steps when using CHG solution during a shower (unless your health care provider gives you different instructions): Start the shower. Use your normal soap and shampoo to wash your face and hair. Turn off the shower or move out of the shower stream. Pour the CHG onto a clean washcloth. Do not use any type of brush or rough-edged sponge. Starting at your neck, lather your body down to your toes. Make sure you follow these instructions: If you will be having surgery, pay special attention to the part of your body where you will be having surgery. Scrub  this area for at least 1 minute. Do not use CHG on your head or face. If the solution gets into your ears or eyes, rinse them well with water. Avoid your genital area. Avoid any areas of skin that have broken skin, cuts, or scrapes. Scrub your back and under your arms. Make sure to wash skin folds. Let the lather sit on your skin for 1-2 minutes or as long as told by your health care provider. Thoroughly rinse your entire body in the shower. Make sure that all body creases and crevices are rinsed well. Dry off with a clean towel. Do not put any substances on your body afterward--such as powder, lotion, or perfume--unless you are told to do so by your health care provider. Only use lotions  that are recommended by the manufacturer. Put on clean clothes or pajamas. If it is the night before your surgery, sleep in clean sheets.  During a sponge bath Follow these steps when using CHG solution during a sponge bath (unless your health care provider gives you different instructions): Use your normal soap and shampoo to wash your face and hair. Pour the CHG onto a clean washcloth. Starting at your neck, lather your body down to your toes. Make sure you follow these instructions: If you will be having surgery, pay special attention to the part of your body where you will be having surgery. Scrub this area for at least 1 minute. Do not use CHG on your head or face. If the solution gets into your ears or eyes, rinse them well with water. Avoid your genital area. Avoid any areas of skin that have broken skin, cuts, or scrapes. Scrub your back and under your arms. Make sure to wash skin folds. Let the lather sit on your skin for 1-2 minutes or as long as told by your health care provider. Using a different clean, wet washcloth, thoroughly rinse your entire body. Make sure that all body creases and crevices are rinsed well. Dry off with a clean towel. Do not put any substances on your body afterward--such as powder, lotion, or perfume--unless you are told to do so by your health care provider. Only use lotions that are recommended by the manufacturer. Put on clean clothes or pajamas. If it is the night before your surgery, sleep in clean sheets. How to use CHG prepackaged cloths Only use CHG cloths as told by your health care provider, and follow the instructions on the label. Use the CHG cloth on clean, dry skin. Do not use the CHG cloth on your head or face unless your health care provider tells you to. When washing with the CHG cloth: Avoid your genital area. Avoid any areas of skin that have broken skin, cuts, or scrapes. Before surgery Follow these steps when using a CHG cloth to  clean before surgery (unless your health care provider gives you different instructions): Using the CHG cloth, vigorously scrub the part of your body where you will be having surgery. Scrub using a back-and-forth motion for 3 minutes. The area on your body should be completely wet with CHG when you are done scrubbing. Do not rinse. Discard the cloth and let the area air-dry. Do not put any substances on the area afterward, such as powder, lotion, or perfume. Put on clean clothes or pajamas. If it is the night before your surgery, sleep in clean sheets.  For general bathing Follow these steps when using CHG cloths for general bathing (unless your health care provider  gives you different instructions). Use a separate CHG cloth for each area of your body. Make sure you wash between any folds of skin and between your fingers and toes. Wash your body in the following order, switching to a new cloth after each step: The front of your neck, shoulders, and chest. Both of your arms, under your arms, and your hands. Your stomach and groin area, avoiding the genitals. Your right leg and foot. Your left leg and foot. The back of your neck, your back, and your buttocks. Do not rinse. Discard the cloth and let the area air-dry. Do not put any substances on your body afterward--such as powder, lotion, or perfume--unless you are told to do so by your health care provider. Only use lotions that are recommended by the manufacturer. Put on clean clothes or pajamas. Contact a health care provider if: Your skin gets irritated after scrubbing. You have questions about using your solution or cloth. You swallow any chlorhexidine. Call your local poison control center (1-337 441 5064 in the [place] Get help right away if: Your eyes itch badly, or they become very red or swollen. Your skin itches badly and is red or swollen. Your hearing changes. You have trouble seeing. You have swelling or tingling in your mouth or  throat. You have trouble breathing. These symptoms may represent a serious problem that is an emergency. Do not wait to see if the symptoms will go away. Get medical help right away. Call your local emergency services (911 in the [place] Do not drive yourself to the hospital. Summary Chlorhexidine gluconate (CHG) is a germ-killing (antiseptic) solution that is used to clean the skin. Cleaning your skin with CHG may help to lower your risk for infection. You may be given CHG to use for bathing. It may be in a bottle or in a prepackaged cloth to use on your skin. Carefully follow your health care provider's instructions and the instructions on the product label. Do not use CHG if you have a chlorhexidine allergy. Contact your health care provider if your skin gets irritated after scrubbing. This information is not intended to replace advice given to you by your health care provider. Make sure you discuss any questions you have with your health care provider. Document Revised: 12/09/2020 Document Reviewed: 12/09/2020 Elsevier Patient Education  2022 [place]. Shoulder Arthroscopy, Care After The following information offers guidance on how to care for yourself after your procedure. Your health care provider may also give you more specific instructions. If you have problems or questions, contact your health care provider. What can I expect after the procedure? After the procedure, it is common to have: Pain. Swelling. A small amount of fluid from the incision. Stiffness that improves over time. Follow these instructions at home: If you have a sling or an immobilizer: Wear it as told by your health care provider. Remove it only as told by your health care provider. These devices protect your shoulder and help it heal by keeping it in place. Check the skin around it every day. Tell your health care provider about any concerns. Loosen it if your fingers tingle, become numb, or turn cold and  blue. Keep the sling or immobilizer clean. If it is not waterproof: Do not let it get wet. Cover it with a watertight covering when you take a bath or a shower. Incision care  Follow instructions from your health care provider about how to take care of your incisions. Make sure you: Wash your hands with  soap and water for at least 20 seconds before and after you change your bandage (dressing). If soap and water are not available, use hand sanitizer. Change your dressing as told by your health care provider. Leave stitches (sutures), skin glue, or adhesive strips in place. These skin closures may need to stay in place for 2 weeks or longer. If adhesive strip edges start to loosen and curl up, you may trim the loose edges. Do not remove adhesive strips completely unless your health care provider tells you to do that. Check your incision areas every day for signs of infection. Check for: Redness. More swelling or pain. Blood or more fluid. Warmth. Pus or a bad smell. Do not take baths, swim, or use a hot tub until your health care provider approves. Ask your health care provider if you may take showers. You may only be allowed to take sponge baths. Managing pain, stiffness, and swelling  If directed, put ice on the affected area. To do this: If you have a removable sling or immobilizer, remove it as told by your health care provider. Put ice in a plastic bag or use the icing device (cold therapy unit) that you were given. Follow instructions from your health care provider about how to use the icing device. Place a towel between your skin and the bag or between your skin and the icing device. Leave the ice on for 20 minutes, 2-3 times a day. Remove the ice if your skin turns bright red. This is very important. If you cannot feel pain, heat, or cold, you have a greater risk of damage to the area. Move your fingers often to reduce stiffness and swelling. Raise (elevate) the injured area above  the level of your heart while you are lying down. It may help to sleep in a sitting position for a few days after your procedure. Try sleeping in a reclining chair or propping yourself up with extra pillows in bed. Activity Ask your health care provider what activities are safe for you during recovery. Do not lift with your affected shoulder until your health care provider approves. Avoid pulling and pushing with the arm on your affected side. If physical therapy was prescribed, do exercises as directed. Doing exercises may help to improve shoulder movement and flexibility (range of motion). Driving Ask your health care provider when it is safe to drive if you have a sling or immobilizer. Ask your health care provider if the medicine prescribed to you requires you to avoid driving or using machinery. General instructions Take over-the-counter and prescription medicines only as told by your health care provider. Ask your health care provider if the medicine prescribed to you can cause constipation. You may need to take these actions to prevent or treat constipation: Drink enough fluid to keep your urine pale yellow. Take over-the-counter or prescription medicines. Eat foods that are high in fiber, such as beans, whole grains, and fresh fruits and vegetables. Limit foods that are high in fat and processed sugars, such as fried or sweet foods. Do not use any products that contain nicotine or tobacco. These products include cigarettes, chewing tobacco, and vaping devices, such as e-cigarettes. These can delay incision healing after surgery. If you need help quitting, ask your health care provider. Keep all follow-up visits. This is important. Contact a health care provider if: You have a fever or chills. You have severe pain. You have any of these signs of infection: Redness around an incision. More swelling or pain  in an incision area. Blood or more fluid coming from an incision. Warmth coming  from an incision. Pus or a bad smell coming from an incision. You notice that an incision has opened up. You develop a rash. Get help right away if: You have difficulty breathing. You have chest pain. You notice that your fingers tingle, are numb, or are cold and blue even after you loosen your sling or immobilizer. You develop pain in your lower leg or at the back of your knee. These symptoms may represent a serious problem that is an emergency. Do not wait to see if the symptoms will go away. Get medical help right away. Call your local emergency services (911 in the [place] Do not drive yourself to the hospital. Summary If you have a sling or an immobilizer, wear it as told by your health care provider. It may help to sleep in a sitting position for a few days after your procedure. If physical therapy was prescribed, do exercises as directed. Doing exercises may help to improve shoulder movement and flexibility (range of motion). Keep all follow-up visits. This is important. This information is not intended to replace advice given to you by your health care provider. Make sure you discuss any questions you have with your health care provider. Document Revised: 05/27/2020 Document Reviewed: 05/27/2020 Elsevier Patient Education  [place] Anesthesia, Adult, Care After This sheet gives you information about how to care for yourself after your procedure. Your health care provider may also give you more specific instructions. If you have problems or questions, contact your health care provider. What can I expect after the procedure? After the procedure, the following side effects are common: Pain or discomfort at the IV site. Nausea. Vomiting. Sore throat. Trouble concentrating. Feeling cold or chills. Feeling weak or tired. Sleepiness and fatigue. Soreness and body aches. These side effects can affect parts of the body that were not involved in surgery. Follow these  instructions at home: For the time period you were told by your health care provider:  Rest. Do not participate in activities where you could fall or become injured. Do not drive or use machinery. Do not drink alcohol. Do not take sleeping pills or medicines that cause drowsiness. Do not make important decisions or sign legal documents. Do not take care of children on your own. Eating and drinking Follow any instructions from your health care provider about eating or drinking restrictions. When you feel hungry, start by eating small amounts of foods that are soft and easy to digest (bland), such as toast. Gradually return to your regular diet. Drink enough fluid to keep your urine pale yellow. If you vomit, rehydrate by drinking water, juice, or clear broth. General instructions If you have sleep apnea, surgery and certain medicines can increase your risk for breathing problems. Follow instructions from your health care provider about wearing your sleep device: Anytime you are sleeping, including during daytime naps. While taking prescription pain medicines, sleeping medicines, or medicines that make you drowsy. Have a responsible adult stay with you for the time you are told. It is important to have someone help care for you until you are awake and alert. Return to your normal activities as told by your health care provider. Ask your health care provider what activities are safe for you. Take over-the-counter and prescription medicines only as told by your health care provider. If you smoke, do not smoke without supervision. Keep all follow-up visits as told by  your health care provider. This is important. Contact a health care provider if: You have nausea or vomiting that does not get better with medicine. You cannot eat or drink without vomiting. You have pain that does not get better with medicine. You are unable to pass urine. You develop a skin rash. You have a fever. You have  redness around your IV site that gets worse. Get help right away if: You have difficulty breathing. You have chest pain. You have blood in your urine or stool, or you vomit blood. Summary After the procedure, it is common to have a sore throat or nausea. It is also common to feel tired. Have a responsible adult stay with you for the time you are told. It is important to have someone help care for you until you are awake and alert. When you feel hungry, start by eating small amounts of foods that are soft and easy to digest (bland), such as toast. Gradually return to your regular diet. Drink enough fluid to keep your urine pale yellow. Return to your normal activities as told by your health care provider. Ask your health care provider what activities are safe for you. This information is not intended to replace advice given to you by your health care provider. Make sure you discuss any questions you have with your health care provider. Document Revised: 06/13/2020 Document Reviewed: 01/11/2020 Elsevier Patient Education  2022 [employer].

## 2021-09-29 ENCOUNTER — Other Ambulatory Visit: Payer: Self-pay

## 2021-09-29 ENCOUNTER — Encounter (HOSPITAL_COMMUNITY): Payer: Self-pay

## 2021-09-29 ENCOUNTER — Encounter (HOSPITAL_COMMUNITY)
Admission: RE | Admit: 2021-09-29 | Discharge: 2021-09-29 | Disposition: A | Discharge status: Home / Self Care | Payer: Medicaid Other | Source: Ambulatory Visit | Attending: Orthopedic Surgery | Admitting: Orthopedic Surgery

## 2021-09-29 VITALS — BP 162/70 | HR 60 | Temp 97.8°F | Resp 18 | Ht 62.0 in | Wt 160.0 lb

## 2021-09-29 DIAGNOSIS — Z01812 Encounter for preprocedural laboratory examination: Secondary | ICD-10-CM | POA: Insufficient documentation

## 2021-09-29 DIAGNOSIS — Z01818 Encounter for other preprocedural examination: Secondary | ICD-10-CM

## 2021-09-29 HISTORY — DX: Gastro-esophageal reflux disease without esophagitis: K21.9

## 2021-09-29 LAB — BASIC METABOLIC PANEL
Anion gap: 10 (ref 5–15)
BUN: 10 mg/dL (ref 6–20)
CO2: 22 mmol/L (ref 22–32)
Calcium: 9.2 mg/dL (ref 8.9–10.3)
Chloride: 105 mmol/L (ref 98–111)
Creatinine, Ser: 0.66 mg/dL (ref 0.44–1.00)
GFR, Estimated: >60 mL/min (ref >60)
Glucose, Bld: 82 mg/dL (ref 70–99)
Potassium: 3.5 mmol/L (ref 3.5–5.1)
Sodium: 137 mmol/L (ref 135–145)

## 2021-09-29 LAB — HCG, SERUM, QUALITATIVE: Preg, Serum: NEGATIVE

## 2021-09-29 LAB — CBC
HCT: 35.8 % — ABNORMAL LOW (ref 36.0–46.0)
Hemoglobin: 11.9 g/dL — ABNORMAL LOW (ref 12.0–15.0)
MCH: 24.3 pg — ABNORMAL LOW (ref 26.0–34.0)
MCHC: 33.2 g/dL (ref 30.0–36.0)
MCV: 73.1 fL — ABNORMAL LOW (ref 80.0–100.0)
Platelets: 268 10*3/uL (ref 150–400)
RBC: 4.9 MIL/uL (ref 3.87–5.11)
RDW: 17.2 % — ABNORMAL HIGH (ref 11.5–15.5)
WBC: 9.1 10*3/uL (ref 4.0–10.5)
nRBC: 0 % (ref 0.0–0.2)

## 2021-10-01 ENCOUNTER — Encounter: Payer: Self-pay | Admitting: Adult Health

## 2021-10-01 ENCOUNTER — Other Ambulatory Visit: Payer: Self-pay

## 2021-10-01 ENCOUNTER — Ambulatory Visit (INDEPENDENT_AMBULATORY_CARE_PROVIDER_SITE_OTHER): Payer: Medicaid Other | Admitting: Adult Health

## 2021-10-01 ENCOUNTER — Other Ambulatory Visit (HOSPITAL_COMMUNITY)
Admission: RE | Admit: 2021-10-01 | Discharge: 2021-10-01 | Disposition: A | Discharge status: Home / Self Care | Payer: Medicaid Other | Source: Ambulatory Visit | Attending: Adult Health | Admitting: Adult Health

## 2021-10-01 VITALS — BP 167/85 | HR 76 | Ht 61.0 in | Wt 161.5 lb

## 2021-10-01 DIAGNOSIS — Z0000 Encounter for general adult medical examination without abnormal findings: Secondary | ICD-10-CM

## 2021-10-01 DIAGNOSIS — Z01419 Encounter for gynecological examination (general) (routine) without abnormal findings: Secondary | ICD-10-CM | POA: Insufficient documentation

## 2021-10-01 DIAGNOSIS — I10 Essential (primary) hypertension: Secondary | ICD-10-CM

## 2021-10-01 DIAGNOSIS — Z1211 Encounter for screening for malignant neoplasm of colon: Secondary | ICD-10-CM

## 2021-10-01 DIAGNOSIS — Z3041 Encounter for surveillance of contraceptive pills: Secondary | ICD-10-CM | POA: Insufficient documentation

## 2021-10-01 LAB — HEMOCCULT GUIAC POC 1CARD (OFFICE): Fecal Occult Blood, POC: NEGATIVE

## 2021-10-01 MED ORDER — LOSARTAN POTASSIUM 25 MG PO TABS: ORAL_TABLET | ORAL | 4 refills | Status: DC

## 2021-10-01 NOTE — Progress Notes (Signed)
Patient ID: Sarah Cross, female   DOB: 25-Apr-1981, 40 y.o.   MRN: [id number] History of Present Illness: Sarah Cross is a 40 year old black female,divorced, G2P1102, in for a well woman gyn exam and pap. She says she is having right rotator cuff surgery tomorrow. She had excision of abdominal mass in September, it was endometriosis.  She is happy with POPs.Periods good. PCP is Dr Gerarda Fraction.   Current Medications, Allergies, Past Medical History, Past Surgical History, Family History and Social History were reviewed in [facility] record.     Review of Systems:  Patient denies any headaches, hearing loss, fatigue, blurred vision, shortness of breath, chest pain, abdominal pain, problems with bowel movements, urination, or intercourse. No joint pain or mood swings.    Physical Exam:BP (!) 167/85 (BP Location: Left Arm, Patient Position: Sitting, Cuff Size: Normal)    Pulse 76    Ht 5\' 1"  (1.549 m)    Wt 161 lb 8 oz (73.3 kg)    LMP 09/16/2021    BMI 30.52 kg/m   General:  Well developed, well nourished, no acute distress Skin:  Warm and dry Neck:  Midline trachea, normal thyroid, good ROM, no lymphadenopathy Lungs; Clear to auscultation bilaterally Breast:  No dominant palpable mass, retraction, or nipple discharge Cardiovascular: Regular rate and rhythm Abdomen:  Soft, non tender, no hepatosplenomegaly Pelvic:  External genitalia is normal in appearance, no lesions.  The vagina is normal in appearance.Pinkish discharge. Urethra has no lesions or masses. The cervix is bulbous.Pap with HR HPV genotyping performed.  Uterus is felt to be normal size, shape, and contour.  No adnexal masses or tenderness noted.Bladder is non tender, no masses felt. Rectal: Good sphincter tone, no polyps, or hemorrhoids felt.  Hemoccult negative. Extremities/musculoskeletal:  No swelling or varicosities noted, no clubbing or cyanosis Psych:  No mood changes, alert and cooperative,seems  happy AA is 0  Fall risk is low Depression screen [facility] 2/9 10/01/2021 08/07/2020 08/05/2018  Decreased Interest 0 2 0  Down, Depressed, Hopeless 0 1 0  PHQ - 2 Score 0 3 0  Altered sleeping 1 1 -  Tired, decreased energy 1 1 -  Change in appetite 0 2 -  Feeling bad or failure about yourself  0 0 -  Trouble concentrating 0 0 -  Moving slowly or fidgety/restless 0 1 -  Suicidal thoughts 0 0 -  PHQ-9 Score 2 8 -    GAD 7 : Generalized Anxiety Score 10/01/2021 08/07/2020  Nervous, Anxious, on Edge 0 0  Control/stop worrying 0 0  Worry too much - different things 0 0  Trouble relaxing 0 1  Restless 0 0  Easily annoyed or irritable 0 0  Afraid - awful might happen 0 0  Total GAD 7 Score 0 1      Upstream - 10/01/21 1049       Pregnancy Intention Screening   Does the patient want to become pregnant in the next year? No    Does the patient's partner want to become pregnant in the next year? No    Would the patient like to discuss contraceptive options today? No      Contraception Wrap Up   Current Method Oral Contraceptive    End Method Oral Contraceptive    Contraception Counseling Provided No            Examination chaperoned by Levy Pupa LPN   Impression and Plan: 1. Routine general medical examination at a health  care facility Pap sent  2. Encounter for gynecological examination with Papanicolaou smear of cervix Pap sent Physical in 1 year Pap in 3 if normal Has mammogram in January   3. Encounter for screening fecal occult blood testing  4. Encounter for surveillance of contraceptive pills Continue Micronor has refills   5. Hypertension, unspecified type Will refill cozaar Meds ordered this encounter  Medications   losartan (COZAAR) 25 MG tablet    Sig: TAKE 1 TABLET(25 MG) BY MOUTH DAILY    Dispense:  90 tablet    Refill:  4    Order Specific Question:   Supervising Provider    Answer:   Tania Ade H [[id number]]    Follow up in 6 months for BP  check and ROS

## 2021-10-01 NOTE — Addendum Note (Signed)
Addended by: Linton Rump on: 10/01/2021 11:41 AM   Modules accepted: Orders

## 2021-10-02 ENCOUNTER — Ambulatory Visit (HOSPITAL_COMMUNITY): Payer: Medicaid Other | Admitting: Anesthesiology

## 2021-10-02 ENCOUNTER — Ambulatory Visit (HOSPITAL_COMMUNITY)
Admission: RE | Admit: 2021-10-02 | Discharge: 2021-10-02 | Disposition: A | Discharge status: Home / Self Care | Payer: Medicaid Other | Source: Ambulatory Visit | Attending: Orthopedic Surgery | Admitting: Orthopedic Surgery

## 2021-10-02 ENCOUNTER — Encounter (HOSPITAL_COMMUNITY)
Admission: RE | Disposition: A | Discharge status: Home / Self Care | Payer: Self-pay | Source: Ambulatory Visit | Attending: Orthopedic Surgery

## 2021-10-02 ENCOUNTER — Encounter: Payer: Self-pay | Admitting: Orthopedic Surgery

## 2021-10-02 ENCOUNTER — Encounter (HOSPITAL_COMMUNITY): Payer: Self-pay | Admitting: Orthopedic Surgery

## 2021-10-02 DIAGNOSIS — S46011D Strain of muscle(s) and tendon(s) of the rotator cuff of right shoulder, subsequent encounter: Secondary | ICD-10-CM

## 2021-10-02 DIAGNOSIS — M75111 Incomplete rotator cuff tear or rupture of right shoulder, not specified as traumatic: Secondary | ICD-10-CM | POA: Insufficient documentation

## 2021-10-02 DIAGNOSIS — Z79899 Other long term (current) drug therapy: Secondary | ICD-10-CM | POA: Insufficient documentation

## 2021-10-02 DIAGNOSIS — K219 Gastro-esophageal reflux disease without esophagitis: Secondary | ICD-10-CM | POA: Diagnosis not present

## 2021-10-02 DIAGNOSIS — I10 Essential (primary) hypertension: Secondary | ICD-10-CM | POA: Diagnosis not present

## 2021-10-02 HISTORY — PX: SHOULDER ARTHROSCOPY WITH LABRAL REPAIR: SHX5691

## 2021-10-02 SURGERY — ARTHROSCOPY, SHOULDER, WITH GLENOID LABRUM REPAIR
Anesthesia: Regional | Site: Shoulder | Laterality: Right

## 2021-10-02 MED ORDER — PHENYLEPHRINE 40 MCG/ML (10ML) SYRINGE FOR IV PUSH (FOR BLOOD PRESSURE SUPPORT)
PREFILLED_SYRINGE | INTRAVENOUS | Status: DC | PRN
  Administered 2021-10-02: 80 ug via INTRAVENOUS
  Administered 2021-10-02: 120 ug via INTRAVENOUS
  Administered 2021-10-02 (×2): 80 ug via INTRAVENOUS

## 2021-10-02 MED ORDER — ROCURONIUM BROMIDE 10 MG/ML (PF) SYRINGE
PREFILLED_SYRINGE | INTRAVENOUS | Status: DC | PRN
  Administered 2021-10-02: 50 mg via INTRAVENOUS
  Administered 2021-10-02: 10 mg via INTRAVENOUS
  Administered 2021-10-02: 20 mg via INTRAVENOUS
  Administered 2021-10-02: 10 mg via INTRAVENOUS

## 2021-10-02 MED ORDER — FENTANYL CITRATE (PF) 100 MCG/2ML IJ SOLN
INTRAMUSCULAR | Status: AC
  Filled 2021-10-02: qty 2

## 2021-10-02 MED ORDER — DEXAMETHASONE SODIUM PHOSPHATE 10 MG/ML IJ SOLN
INTRAMUSCULAR | Status: DC | PRN
  Administered 2021-10-02: 5 mg via INTRAVENOUS

## 2021-10-02 MED ORDER — ONDANSETRON HCL 4 MG PO TABS: 4.0000 mg | ORAL_TABLET | Freq: Three times a day (TID) | ORAL | 0 refills | Status: AC | PRN

## 2021-10-02 MED ORDER — LACTATED RINGERS IV BOLUS: 500.0000 mL | Freq: Once | INTRAVENOUS | Status: DC

## 2021-10-02 MED ORDER — ORAL CARE MOUTH RINSE: 15.0000 mL | Freq: Once | OROMUCOSAL | Status: AC

## 2021-10-02 MED ORDER — LACTATED RINGERS IV SOLN: INTRAVENOUS | Status: DC

## 2021-10-02 MED ORDER — CELECOXIB 100 MG PO CAPS: 100.0000 mg | ORAL_CAPSULE | Freq: Every day | ORAL | 0 refills | Status: AC

## 2021-10-02 MED ORDER — ASPIRIN EC 81 MG PO TBEC: 81.0000 mg | DELAYED_RELEASE_TABLET | Freq: Two times a day (BID) | ORAL | 0 refills | Status: AC

## 2021-10-02 MED ORDER — EPINEPHRINE PF 1 MG/ML IJ SOLN
INTRAMUSCULAR | Status: AC
  Filled 2021-10-02: qty 8

## 2021-10-02 MED ORDER — SCOPOLAMINE 1 MG/3DAYS TD PT72
MEDICATED_PATCH | TRANSDERMAL | Status: AC
  Filled 2021-10-02: qty 1

## 2021-10-02 MED ORDER — MIDAZOLAM HCL 5 MG/5ML IJ SOLN
INTRAMUSCULAR | Status: DC | PRN
  Administered 2021-10-02: 2 mg via INTRAVENOUS

## 2021-10-02 MED ORDER — PROMETHAZINE HCL 25 MG/ML IJ SOLN
INTRAMUSCULAR | Status: AC
  Filled 2021-10-02: qty 1

## 2021-10-02 MED ORDER — MIDAZOLAM HCL 2 MG/2ML IJ SOLN
INTRAMUSCULAR | Status: AC
  Filled 2021-10-02: qty 2

## 2021-10-02 MED ORDER — SODIUM CHLORIDE 0.9 % IR SOLN
Status: DC | PRN
  Administered 2021-10-02 (×10): 3000 mL

## 2021-10-02 MED ORDER — DEXAMETHASONE SODIUM PHOSPHATE 10 MG/ML IJ SOLN
INTRAMUSCULAR | Status: AC
  Filled 2021-10-02: qty 1

## 2021-10-02 MED ORDER — SUGAMMADEX SODIUM 200 MG/2ML IV SOLN
INTRAVENOUS | Status: DC | PRN
  Administered 2021-10-02: 200 mg via INTRAVENOUS

## 2021-10-02 MED ORDER — ONDANSETRON HCL 4 MG/2ML IJ SOLN
INTRAMUSCULAR | Status: DC | PRN
  Administered 2021-10-02: 4 mg via INTRAVENOUS

## 2021-10-02 MED ORDER — PHENYLEPHRINE HCL-NACL 20-0.9 MG/250ML-% IV SOLN
INTRAVENOUS | Status: DC | PRN
  Administered 2021-10-02: 60 ug/min via INTRAVENOUS

## 2021-10-02 MED ORDER — PROPOFOL 10 MG/ML IV BOLUS
INTRAVENOUS | Status: DC | PRN
  Administered 2021-10-02: 200 mg via INTRAVENOUS

## 2021-10-02 MED ORDER — ONDANSETRON HCL 4 MG/2ML IJ SOLN
INTRAMUSCULAR | Status: AC
  Filled 2021-10-02: qty 2

## 2021-10-02 MED ORDER — BUPIVACAINE-EPINEPHRINE (PF) 0.5% -1:200000 IJ SOLN
INTRAMUSCULAR | Status: AC
  Filled 2021-10-02: qty 30

## 2021-10-02 MED ORDER — HYDROMORPHONE HCL 1 MG/ML IJ SOLN: 0.2500 mg | INTRAMUSCULAR | Status: DC | PRN

## 2021-10-02 MED ORDER — LIDOCAINE HCL (PF) 1 % IJ SOLN
INTRAMUSCULAR | Status: AC
  Filled 2021-10-02: qty 30

## 2021-10-02 MED ORDER — OXYCODONE HCL 5 MG PO TABS: 5.0000 mg | ORAL_TABLET | ORAL | 0 refills | Status: AC | PRN

## 2021-10-02 MED ORDER — PROMETHAZINE HCL 25 MG/ML IJ SOLN
12.5000 mg | INTRAMUSCULAR | Status: DC | PRN
  Administered 2021-10-02: 12:00:00 12.5 mg via INTRAVENOUS

## 2021-10-02 MED ORDER — LIDOCAINE 2% (20 MG/ML) 5 ML SYRINGE
INTRAMUSCULAR | Status: DC | PRN
  Administered 2021-10-02: 60 mg via INTRAVENOUS

## 2021-10-02 MED ORDER — CEFAZOLIN SODIUM-DEXTROSE 2-4 GM/100ML-% IV SOLN
2.0000 g | INTRAVENOUS | Status: AC
  Administered 2021-10-02: 08:00:00 2 g via INTRAVENOUS

## 2021-10-02 MED ORDER — CHLORHEXIDINE GLUCONATE 0.12 % MT SOLN
15.0000 mL | Freq: Once | OROMUCOSAL | Status: AC
  Administered 2021-10-02: 07:00:00 15 mL via OROMUCOSAL

## 2021-10-02 MED ORDER — MEPERIDINE HCL 50 MG/ML IJ SOLN: 6.2500 mg | INTRAMUSCULAR | Status: DC | PRN

## 2021-10-02 MED ORDER — SCOPOLAMINE 1 MG/3DAYS TD PT72
MEDICATED_PATCH | TRANSDERMAL | Status: DC | PRN
  Administered 2021-10-02: 1 via TRANSDERMAL

## 2021-10-02 MED ORDER — LIDOCAINE HCL (PF) 1 % IJ SOLN
INTRAMUSCULAR | Status: DC | PRN
  Administered 2021-10-02: 3 mL

## 2021-10-02 MED ORDER — LIDOCAINE HCL (PF) 2 % IJ SOLN
INTRAMUSCULAR | Status: AC
  Filled 2021-10-02: qty 5

## 2021-10-02 MED ORDER — ONDANSETRON HCL 4 MG/2ML IJ SOLN
4.0000 mg | Freq: Once | INTRAMUSCULAR | Status: AC | PRN
  Administered 2021-10-02: 12:00:00 4 mg via INTRAVENOUS
  Filled 2021-10-02: qty 2

## 2021-10-02 MED ORDER — BUPIVACAINE LIPOSOME 1.3 % IJ SUSP
INTRAMUSCULAR | Status: DC | PRN
  Administered 2021-10-02: 10 mL via PERINEURAL

## 2021-10-02 MED ORDER — PHENYLEPHRINE 40 MCG/ML (10ML) SYRINGE FOR IV PUSH (FOR BLOOD PRESSURE SUPPORT)
PREFILLED_SYRINGE | INTRAVENOUS | Status: AC
  Filled 2021-10-02: qty 10

## 2021-10-02 MED ORDER — BUPIVACAINE LIPOSOME 1.3 % IJ SUSP
INTRAMUSCULAR | Status: AC
  Filled 2021-10-02: qty 10

## 2021-10-02 MED ORDER — EPINEPHRINE PF 1 MG/ML IJ SOLN
INTRAMUSCULAR | Status: AC
  Filled 2021-10-02: qty 4

## 2021-10-02 MED ORDER — ACETAMINOPHEN 500 MG PO TABS: 1000.0000 mg | ORAL_TABLET | Freq: Three times a day (TID) | ORAL | 0 refills | Status: AC

## 2021-10-02 MED ORDER — BUPIVACAINE HCL (PF) 0.5 % IJ SOLN
INTRAMUSCULAR | Status: DC | PRN
  Administered 2021-10-02: 10 mL via PERINEURAL

## 2021-10-02 MED ORDER — DEXMEDETOMIDINE (PRECEDEX) IN NS 20 MCG/5ML (4 MCG/ML) IV SYRINGE
PREFILLED_SYRINGE | INTRAVENOUS | Status: DC | PRN
  Administered 2021-10-02 (×2): 10 ug via INTRAVENOUS

## 2021-10-02 MED ORDER — PROPOFOL 10 MG/ML IV BOLUS
INTRAVENOUS | Status: AC
  Filled 2021-10-02: qty 20

## 2021-10-02 MED ORDER — ROCURONIUM BROMIDE 10 MG/ML (PF) SYRINGE
PREFILLED_SYRINGE | INTRAVENOUS | Status: AC
  Filled 2021-10-02: qty 10

## 2021-10-02 MED ORDER — SODIUM CHLORIDE FLUSH 0.9 % IV SOLN
INTRAVENOUS | Status: AC
  Filled 2021-10-02: qty 10

## 2021-10-02 MED ORDER — BUPIVACAINE HCL (PF) 0.5 % IJ SOLN
INTRAMUSCULAR | Status: AC
  Filled 2021-10-02: qty 30

## 2021-10-02 MED ORDER — FENTANYL CITRATE (PF) 100 MCG/2ML IJ SOLN
INTRAMUSCULAR | Status: DC | PRN
  Administered 2021-10-02 (×3): 50 ug via INTRAVENOUS

## 2021-10-02 MED ORDER — CEFAZOLIN SODIUM-DEXTROSE 2-4 GM/100ML-% IV SOLN
INTRAVENOUS | Status: AC
  Filled 2021-10-02: qty 100

## 2021-10-02 SURGICAL SUPPLY — 57 items
ANCH SUT PUSHLCK 15.5X2.9 (Orthopedic Implant) ×1 IMPLANT
ANCH SUT SWLK 19.1X4.75 (Anchor) ×1 IMPLANT
ANCHOR PUSHLOCK BIOCOMP 2.9X15 (Orthopedic Implant) ×2 IMPLANT
ANCHOR SUT 1.8 FBRTK KNTLS 2SU (Anchor) ×6 IMPLANT
ANCHOR SUT BIO SW 4.75X19.1 (Anchor) ×2 IMPLANT
APL PRP STRL LF DISP 70% ISPRP (MISCELLANEOUS) ×1
BLADE HEX COATED 2.75 (ELECTRODE) ×2 IMPLANT
BLADE SURG SZ11 CARB STEEL (BLADE) ×4 IMPLANT
CANNULA TWIST IN 8.25X7CM (CANNULA) ×2 IMPLANT
CHLORAPREP W/TINT 26 (MISCELLANEOUS) ×4 IMPLANT
CLOTH BEACON ORANGE TIMEOUT ST (SAFETY) ×4 IMPLANT
COVER LIGHT HANDLE STERIS (MISCELLANEOUS) ×8 IMPLANT
CUTTER BONE 4.0MM X 13CM (MISCELLANEOUS) ×2 IMPLANT
DRAPE HALF SHEET 40X57 (DRAPES) ×4 IMPLANT
DRAPE SHOULDER BEACH CHAIR (DRAPES) ×4 IMPLANT
ELECT REM PT RETURN 9FT ADLT (ELECTROSURGICAL) ×3
ELECTRODE REM PT RTRN 9FT ADLT (ELECTROSURGICAL) ×2 IMPLANT
GAUZE 4X4 16PLY ~~LOC~~+RFID DBL (SPONGE) ×2 IMPLANT
GAUZE XEROFORM 5X9 LF (GAUZE/BANDAGES/DRESSINGS) ×2 IMPLANT
GLOVE SRG 8 PF TXTR STRL LF DI (GLOVE) ×2 IMPLANT
GLOVE SURG POLYISO LF SZ7 (GLOVE) ×2 IMPLANT
GLOVE SURG POLYISO LF SZ8 (GLOVE) ×2 IMPLANT
GLOVE SURG UNDER POLY LF SZ7 (GLOVE) ×12 IMPLANT
GLOVE SURG UNDER POLY LF SZ8 (GLOVE) ×3
GOWN STRL REUS W/ TWL XL LVL3 (GOWN DISPOSABLE) ×2 IMPLANT
GOWN STRL REUS W/TWL LRG LVL3 (GOWN DISPOSABLE) ×8 IMPLANT
GOWN STRL REUS W/TWL XL LVL3 (GOWN DISPOSABLE) ×3
IV NS IRRIG 3000ML ARTHROMATIC (IV SOLUTION) ×40 IMPLANT
KIT BLADEGUARD II DBL (SET/KITS/TRAYS/PACK) ×4 IMPLANT
KIT INSERTION 2.9 PUSHLOCK (KITS) ×2 IMPLANT
KIT STR SPEAR 1.8 FBRTK DISP (KITS) ×2 IMPLANT
KIT TURNOVER KIT A (KITS) ×4 IMPLANT
LASSO 90 CVE QUICKPAS (DISPOSABLE) ×4 IMPLANT
MANIFOLD NEPTUNE II (INSTRUMENTS) ×4 IMPLANT
MARKER SKIN DUAL TIP RULER LAB (MISCELLANEOUS) ×4 IMPLANT
NDL SCORPION MULTI FIRE (NEEDLE) IMPLANT
NDL SPNL 18GX3.5 QUINCKE PK (NEEDLE) ×2 IMPLANT
NEEDLE SCORPION MULTI FIRE (NEEDLE) ×3 IMPLANT
NEEDLE SPNL 18GX3.5 QUINCKE PK (NEEDLE) ×3 IMPLANT
NS IRRIG 1000ML POUR BTL (IV SOLUTION) ×4 IMPLANT
PACK TOTAL JOINT (CUSTOM PROCEDURE TRAY) ×4 IMPLANT
PAD ABD 5X9 TENDERSORB (GAUZE/BANDAGES/DRESSINGS) ×6 IMPLANT
PAD ARMBOARD 7.5X6 YLW CONV (MISCELLANEOUS) ×4 IMPLANT
PORT APPOLLO RF 90DEGREE MULTI (SURGICAL WAND) ×2 IMPLANT
SET ARTHROSCOPY INST (INSTRUMENTS) ×4 IMPLANT
SET BASIN LINEN APH (SET/KITS/TRAYS/PACK) ×4 IMPLANT
SET SHOULDER TRAC (MISCELLANEOUS) ×2 IMPLANT
SET SHOULDER TRACTION (MISCELLANEOUS) ×3
SPONGE GAUZE 4X4 12PLY (GAUZE/BANDAGES/DRESSINGS) ×2 IMPLANT
SUTURE TAPE 1.3 40 TPR END (SUTURE) IMPLANT
SUTURETAPE 1.3 40 TPR END (SUTURE) ×3
SYR 10ML LL (SYRINGE) ×2 IMPLANT
SYR BULB IRRIG 60ML STRL (SYRINGE) ×2 IMPLANT
TAPE LABRALWHITE 1.5X36 (TAPE) ×2 IMPLANT
TOWEL OR 17X26 4PK STRL BLUE (TOWEL DISPOSABLE) ×2 IMPLANT
TUBING IN/OUT FLOW W/MAIN PUMP (TUBING) ×4 IMPLANT
YANKAUER SUCT BULB TIP 10FT TU (MISCELLANEOUS) ×4 IMPLANT

## 2021-10-02 NOTE — Interval H&P Note (Signed)
History and Physical Interval Note:  10/02/2021 7:12 AM  Sarah Cross  has presented today for surgery, with the diagnosis of Right shoulder partial rotator cuff tear, SLAP tear.  The various methods of treatment have been discussed with the patient and family. After consideration of risks, benefits and other options for treatment, the patient has consented to  Procedure(s) with comments: SHOULDER ARTHROSCOPY WITH LABRAL REPAIR (Right) - Right shoulder arthroscopy, superior labrum repair, possible biceps tenodesis, possible rotator cuff repair. as a surgical intervention.  The patient's history has been reviewed, patient examined, no change in status, stable for surgery.  I have reviewed the patient's chart and labs.  Questions were answered to the patient's satisfaction.     Sarah Cross

## 2021-10-02 NOTE — Anesthesia Preprocedure Evaluation (Signed)
Anesthesia Evaluation  Patient identified by MRN, date of birth, ID band Patient awake    Reviewed: Allergy & Precautions, NPO status , Patient's Chart, lab work & pertinent test results  History of Anesthesia Complications (+) PONV and history of anesthetic complications  Airway Mallampati: II  TM Distance: >3 FB Neck ROM: Full    Dental  (+) Dental Advisory Given   Pulmonary neg pulmonary ROS,    Pulmonary exam normal breath sounds clear to auscultation       Cardiovascular hypertension, Pt. on medications Normal cardiovascular exam Rhythm:Regular Rate:Normal     Neuro/Psych negative neurological ROS  negative psych ROS   GI/Hepatic Neg liver ROS, GERD  Medicated,  Endo/Other  negative endocrine ROS  Renal/GU negative Renal ROS  negative genitourinary   Musculoskeletal negative musculoskeletal ROS (+)   Abdominal   Peds negative pediatric ROS (+)  Hematology negative hematology ROS (+)   Anesthesia Other Findings   Reproductive/Obstetrics negative OB ROS                           Anesthesia Physical Anesthesia Plan  ASA: 2  Anesthesia Plan: General   Post-op Pain Management: Dilaudid IV and Regional block   Induction: Intravenous  PONV Risk Score and Plan: 4 or greater and Ondansetron, Dexamethasone and Midazolam  Airway Management Planned: Oral ETT  Additional Equipment:   Intra-op Plan:   Post-operative Plan: Extubation in OR  Informed Consent: I have reviewed the patients History and Physical, chart, labs and discussed the procedure including the risks, benefits and alternatives for the proposed anesthesia with the patient or authorized representative who has indicated his/her understanding and acceptance.     Dental advisory given  Plan Discussed with: CRNA and Surgeon  Anesthesia Plan Comments:         Anesthesia Quick Evaluation

## 2021-10-02 NOTE — Op Note (Signed)
Orthopaedic Surgery Operative Note (CSN: [id number])  Sarah Cross  08-13-1981 Date of Surgery: 10/02/2021   Diagnoses:  SLAP tear, right shoulder.   Procedure: Arthroscopic biceps tenotomy Arthroscopic labral repair     Operative Finding Exam under anesthesia: Full range of motion Articular space: No loose bodies, capsule intact, superior labrum tear.  Superior glenoid irritation, undersurface rotator cuff irritation Chondral surfaces:Intact, no sign of chondral degeneration on the glenoid or humeral head Biceps: Irritation, subluxation out of the groove.   Subscapularis: Intact Superior Cuff:  Irritation, but partial tearing, no near thickness component Bursal side: No cuff tear  Successful completion of the planned procedure.  Completed a biceps tenotomy and place a single anchor to repair the SLAP tear.     Post-Op Diagnosis: Same Surgeons:Primary: Mordecai Rasmussen, MD Assistants: Marquita Palms Location: [facility] OR ROOM 4 Anesthesia: General with Exparel interscalene block Antibiotics: Ancef 2 g Tourniquet time: None Estimated Blood Loss: Minimal Complications: None Specimens: None Implants: Implant Name Type Inv. Item Serial No. Manufacturer Lot No. LRB No. Used Action  ANCHOR SUT BIO SW 4.75X19.1 - [id number] Anchor ANCHOR SUT BIO SW 4.75X19.1  [place] [id number] Right 1 Implanted  ANCHOR SUT 1.8 FBRTK KNTLS 2SU - [id number] Anchor ANCHOR SUT 1.8 [employer] [id number] Right 3 Implanted  [place] BIOCOMP 2.9X15 - [id number] Orthopedic Implant ANCHOR [place] BIOCOMP 2.9X15  Rolinda Roan [id number] Right 1 Implanted    Indications for Surgery:   Sarah Cross is a 40 y.o. female with continued shoulder pain refractory to nonoperative measures for an extended period of time.  The risks and benefits were explained at length including but not limited to continued pain, cuff failure, biceps cramping pain, stiffness, need for further surgery and  infection.  She elected to proceed with surgery.  All questions were answered.  Consent was completed.    Procedure:   Patient was correctly identified in the preoperative holding area and operative site marked.  Patient brought to OR and positioned beachchair ensuring that all bony prominences were padded and the head was in an appropriate position.  Anesthesia was induced and the operative shoulder was prepped and draped in the usual sterile fashion.  Timeout was called preincision.  A standard posterior viewing portal was made after localizing the portal with a spinal needle.  An anterior accessory portal was also made.  After clearing the articular space the camera was positioned in the subacromial space.  Findings above.    We identified some irritation around the biceps tendon.  In addition, there was a superior labral tear from the 11:00 to approximately the 1 o'clock position.  Using a scorpion suture passer, the biceps tendon was tagged and then cut at its attachment to the superior labrum.  The biceps tenotomy was completed at this point.  The stump was then lightly debrided with a shaver, and electrocautery.  We then turned our attention to the labrum.  The tear was identified.  The superior aspect of the glenoid was roughly debrided with a bone cutter shaver.  There was some fresh bleeding.  Using a percutaneous method, we able to place a small cannula through the rotator cuff.  We then used a lasso suture passer, and passed a suture tape around the labrum.  We then placed a single drill hole, and a push lock anchor was secured into place.  The labrum was in a much more secure position.  We then turned our attention to the rotator  cuff.  The rotator cuff was evaluated within the glenohumeral joint space.  There was no obvious breach of the rotator cuff tendons.  We examined this very closely.  We then went subacromial, and were able to debride a fair amount of subacromial bursa.  The  undersurface of the acromion was subsequently debrided uses electrocautery.  There were no identifiable spurs.  Through multiple viewing portals, the rotator cuff was evaluated, and deemed to be intact.  There were no identifiable areas of partial-thickness tearing, which I felt was amenable to repair.  The camera, cannulas and associated instruments were all removed from the incisions.  Excess fluid was removed from the glenohumeral joint, and we are able to forcefully remove some additional fluid.  The incisions were closed with 3-0 nylon.  A sterile dressing was placed along with a polar care icing machine and a sling. The patient was awoken from general anesthesia and taken to the PACU in stable condition without complication.     Post-operative plan:  The patient will be non-weightbearing in a sling.  The patient will be discharged home from the PACU.   DVT prophylaxis with aspiring twice daily until she is fully ambulatory. Pain control with PRN pain medication preferring oral medicines.   Follow up plan will be scheduled in approximately 10-14 days for incision check and XR.

## 2021-10-02 NOTE — Anesthesia Procedure Notes (Addendum)
Procedure Name: Intubation Date/Time: 10/02/2021 7:45 AM Performed by: Orlie Dakin, CRNA Pre-anesthesia Checklist: Patient identified, Emergency Drugs available, Suction available and Patient being monitored Patient Re-evaluated:Patient Re-evaluated prior to induction Oxygen Delivery Method: Circle system utilized Preoxygenation: Pre-oxygenation with 100% oxygen Induction Type: IV induction Ventilation: Mask ventilation without difficulty Laryngoscope Size: Mac and 4 Grade View: Grade I Tube type: Oral Tube size: 7.0 mm Number of attempts: 1 Airway Equipment and Method: Stylet Placement Confirmation: ETT inserted through vocal cords under direct vision, positive ETCO2 and breath sounds checked- equal and bilateral Secured at: 22 cm Tube secured with: Tape Dental Injury: Teeth and Oropharynx as per pre-operative assessment  Comments: 4x4s bite block used.

## 2021-10-02 NOTE — H&P (Signed)
°  Below is the most recent clinic note for [name]; any pertinent information regarding their recent medical history will be updated on the day of surgery.    Orthopaedic Clinic Return - Virtual Telephone Visit   **Visit was conducted via telephone at the patient's request.  No vital signs or physical exam were completed.  All previous medical records were readily available during my conversation with the patient.  In total, I was on the phone with the patient for 15 minutes**   Location: Patient: Home Provider: Clinic  [phone] - 11-20 minutes   Assessment: Sarah Cross is a 40 y.o. female with the following: Right shoulder pain with superior labral tear, paralabral cyst, irritation of the biceps tendon and high-grade partial tear of the anterior rotator cuff  Plan: Patient continues to have pain, primarily in the anterior aspect of her shoulder.  This worsens at night.  Once again, we reviewed the MRI results in clinic.  All questions were answered.  She is interested in pursuing surgery.  She would like to proceed on October 02, 2021.  Case will be posted for right shoulder arthroscopy, with superior labral repair, possible biceps tenodesis and possible rotator cuff.   Follow-up: No follow-ups on file.   Subjective:  No chief complaint on file.    History of Present Illness: I connected with  Sarah Cross on 10/02/21 by a telephone enabled telemedicine application and verified that I am speaking with the correct person using two identifiers.   I discussed the limitations of evaluation and management by telemedicine. The patient expressed understanding and agreed to proceed.   Sarah Cross is a 40 y.o. female who presents with right shoulder pain.  At the last clinical visit, we reviewed the results of her right shoulder MRI which does demonstrate partial-thickness tearing of the anterior rotator cuff, as well as irritation of the biceps  tendon, and a superior, posterior labral tear with paralabral cyst.  Surgery was offered to her at that appointment.  However, she was overwhelmed in clinic, and wanted to discuss this further at a later date.  She continues to have pain in the right shoulder.  Pain is throughout, but she notes pain in the superior posterior aspect of her shoulder, as well as the anterior aspect of her shoulder.  Pain gets worse at night.  Medications did not improve her pain.  Physical therapy was also insufficient at providing sustained relief.  She is interested in pursuing surgery at this time.  Review of Systems: No fevers or chills No numbness or tingling No chest pain No shortness of breath No bowel or bladder dysfunction No GI distress No headaches   Objective: No vital signs  Physical Exam:  Telephone consult - No exam   IMAGING: I personally ordered and reviewed the following images:  No new imaging today  Sarah Rasmussen, MD 10/02/2021 7:10 AM

## 2021-10-02 NOTE — Anesthesia Procedure Notes (Signed)
Anesthesia Regional Block: Interscalene brachial plexus block   Pre-Anesthetic Checklist: , timeout performed,  Correct Patient, Correct Site, Correct Laterality,  Correct Procedure, Correct Position, site marked,  Risks and benefits discussed,  Surgical consent,  Pre-op evaluation,  At surgeon's request and post-op pain management  Laterality: Upper and Right  Prep: chloraprep       Needles:  Injection technique: Single-shot  Needle Type: Stimulator Needle - 40     Needle Length: 8.3cm  Needle Gauge: 22   Needle insertion depth: 5 cm   Additional Needles:   Procedures:,,,, ultrasound used (permanent image in chart),,    Narrative:  Start time: 10/02/2021 7:22 AM End time: 10/02/2021 7:27 AM Injection made incrementally with aspirations every 5 mL.  Performed by: Personally  Anesthesiologist: Denese Killings, MD  Additional Notes: Block assessed prior to start of surgery

## 2021-10-02 NOTE — Anesthesia Postprocedure Evaluation (Signed)
Anesthesia Post Note  Patient: Environmental health practitioner  Procedure(s) Performed: SHOULDER ARTHROSCOPY WITH LABRAL REPAIR, BICEPS TENOTOMY (Right: Shoulder)  Patient location during evaluation: PACU Anesthesia Type: Regional Level of consciousness: awake and alert, oriented and sedated Pain management: pain level controlled Vital Signs Assessment: post-procedure vital signs reviewed and stable Respiratory status: spontaneous breathing, nonlabored ventilation and respiratory function stable Cardiovascular status: blood pressure returned to baseline and stable Postop Assessment: no apparent nausea or vomiting Anesthetic complications: no   No notable events documented.   Last Vitals:  Vitals:   10/02/21 1100 10/02/21 1115  BP: (!) 135/91 (!) 143/79  Pulse: 60 62  Resp: 17 16  Temp:    SpO2: 100% 100%    Last Pain:  Vitals:   10/02/21 1115  TempSrc:   PainSc: Asleep                 Murrel Bertram C Charles Niese

## 2021-10-02 NOTE — Addendum Note (Signed)
Addendum  created 10/02/21 1219 by Denese Killings, MD   Charge Capture section accepted, Child order released for a procedure order, Clinical Note Signed, Intraprocedure Blocks edited, Intraprocedure Meds edited, SmartForm saved

## 2021-10-02 NOTE — Transfer of Care (Signed)
Immediate Anesthesia Transfer of Care Note  Patient: Sarah Cross  Procedure(s) Performed: SHOULDER ARTHROSCOPY WITH LABRAL REPAIR, BICEPS TENOTOMY (Right: Shoulder)  Patient Location: PACU  Anesthesia Type:General and Regional  Level of Consciousness: drowsy  Airway & Oxygen Therapy: Patient Spontanous Breathing and Patient connected to face mask oxygen  Post-op Assessment: Report given to RN and Post -op Vital signs reviewed and stable  Post vital signs: Reviewed and stable  Last Vitals:  Vitals Value Taken Time  BP 130/80 10/02/21 1046  Temp    Pulse 58 10/02/21 1047  Resp 16 10/02/21 1047  SpO2 100 % 10/02/21 1047  Vitals shown include unvalidated device data.  Last Pain:  Vitals:   10/02/21 0646  TempSrc: Oral  PainSc: 3       Patients Stated Pain Goal: 6 (88/64/84 7207)  Complications: No notable events documented.

## 2021-10-02 NOTE — Addendum Note (Signed)
Addendum  created 10/02/21 1204 by Denese Killings, MD   Order list changed, Pharmacy for encounter modified

## 2021-10-02 NOTE — Discharge Instructions (Signed)
Sarah Helm A. Amedeo Kinsman, MD [facility] [address] [place],  [place]  [address] Phone: [phone] Fax: [phone]    POST-OPERATIVE INSTRUCTIONS - SHOULDER ARTHROSCOPY  WOUND CARE You may remove the Operative Dressing on Post-Op Day #3 (72hrs after surgery).   Alternatively if you would like you can leave dressing on until follow-up if within 7-8 days but keep it dry. Leave steri-strips in place until they fall off on their own, usually 2 weeks postop. There may be a small amount of fluid/bleeding leaking at the surgical site. This is normal; the shoulder is filled with fluid during the procedure and can leak for 24-48hrs after surgery. ou may change/reinforce the bandage as needed.  Use the Cryocuff or Ice as often as possible for the first 7 days, then as needed for pain relief. Always keep a towel, ACE wrap or other barrier between the cooling unit and your skin.  You may shower on Post-Op Day #3. Gently pat the area dry. Do not soak the shoulder in water or submerge it. Keep dry incisions as dry as possible. Do not go swimming in the pool or ocean until 4 weeks after surgery or when otherwise instructed.    EXERCISES/BRACING Sling should be used at all times until follow-up.  You can remove sling for hygiene.    Please continue to ambulate and do not stay sitting or lying for too long. Perform foot and wrist pumps to assist in circulation.  POST-OP MEDICATIONS- Multimodal approach to pain control In general your pain will be controlled with a combination of substances.  Prescriptions unless otherwise discussed are electronically sent to your pharmacy.  This is a carefully made plan we use to minimize narcotic use.    Celebrex - Anti-inflammatory medication taken on a scheduled basis Acetaminophen - Non-narcotic pain medicine taken on a scheduled basis  Oxycodone - This is a strong narcotic, to be used only on an as needed basis for pain. Aspirin 81mg   - This medicine is used to minimize the risk of blood clots after surgery.  Take until fully mobile after surgery.  Zofran - take as needed for nausea   FOLLOW-UP If you develop a Fever (?101.5), Redness or Drainage from the surgical incision site, please call our office to arrange for an evaluation. Please call the office to schedule a follow-up appointment for your suture removal, 10-14 days post-operatively.    HELPFUL INFORMATION  If you had a block, it will wear off between 8-24 hrs postop typically.  This is period when your pain may go from nearly zero to the pain you would have had postop without the block.  This is an abrupt transition but nothing dangerous is happening.  You may take an extra dose of narcotic when this happens.  You may be more comfortable sleeping in a semi-seated position the first few nights following surgery.  Keep a pillow propped under the elbow and forearm for comfort.  If you have a recliner type of chair it might be beneficial.  If not that is fine too, but it would be helpful to sleep propped up with pillows behind your operated shoulder as well under your elbow and forearm.  This will reduce pulling on the suture lines.  When dressing, put your operative arm in the sleeve first.  When getting undressed, take your operative arm out last.  Loose fitting, button-down shirts are recommended.  Often in the first days after surgery you may be more  comfortable keeping your operative arm under your shirt and not through the sleeve.  You may return to work/school in the next couple of days when you feel up to it.  Desk work and typing in the sling is     fine.  We suggest you use the pain medication the first night prior to going to bed, in order to ease any pain when the anesthesia wears off. You should avoid taking pain medications on an empty stomach as it will make you nauseous.  You should wean off your narcotic medicines as soon as you are able.  Most patients  will be off or using minimal narcotics before their first postop appointment.   Do not drink alcoholic beverages or take illicit drugs when taking pain medications.  It is against the law to drive while taking narcotics.  In some states it is against the law to drive while your arm is in a sling.   Pain medication may make you constipated.  Below are a few solutions to try in this order: Decrease the amount of pain medication if you aren't having pain. Drink lots of decaffeinated fluids. Drink prune juice and/or eat dried prunes  If the first 3 don't work start with additional solutions Take Colace - an over-the-counter stool softener Take Senokot - an over-the-counter laxative Take Miralax - a stronger over-the-counter laxative

## 2021-10-03 ENCOUNTER — Encounter (HOSPITAL_COMMUNITY): Payer: Self-pay | Admitting: Orthopedic Surgery

## 2021-10-09 ENCOUNTER — Telehealth: Payer: Self-pay | Admitting: Orthopedic Surgery

## 2021-10-09 ENCOUNTER — Other Ambulatory Visit: Payer: Self-pay | Admitting: Adult Health

## 2021-10-09 LAB — CYTOLOGY - PAP
Adequacy: ABSENT
Comment: NEGATIVE
Diagnosis: NEGATIVE
High risk HPV: NEGATIVE

## 2021-10-09 MED ORDER — METRONIDAZOLE 500 MG PO TABS: 500.0000 mg | ORAL_TABLET | Freq: Two times a day (BID) | ORAL | 0 refills | Status: DC

## 2021-10-09 NOTE — Telephone Encounter (Signed)
Patient called to schedule post op appointment status/post surgery 10/02/21; done as noted. Patient relayed that for awhile after surgery her left foot was swollen; said it has gone down now.

## 2021-10-09 NOTE — Progress Notes (Signed)
+  BV on pap will rx flagyl  

## 2021-10-10 NOTE — Telephone Encounter (Signed)
Spoke with pt to make sure she doesn't have any warmness, nodules, or continued swelling. Pt states she's better now that she's increased her water and moving more. Let pt know that if there are any more concerns before her f/u to give [place] a call.

## 2021-10-14 ENCOUNTER — Encounter: Payer: Self-pay | Admitting: Orthopedic Surgery

## 2021-10-14 ENCOUNTER — Ambulatory Visit (INDEPENDENT_AMBULATORY_CARE_PROVIDER_SITE_OTHER): Payer: Medicaid Other | Admitting: Orthopedic Surgery

## 2021-10-14 ENCOUNTER — Other Ambulatory Visit: Payer: Self-pay

## 2021-10-14 ENCOUNTER — Ambulatory Visit: Payer: Medicaid Other

## 2021-10-14 VITALS — Ht 61.0 in | Wt 161.0 lb

## 2021-10-14 DIAGNOSIS — S43431D Superior glenoid labrum lesion of right shoulder, subsequent encounter: Secondary | ICD-10-CM | POA: Diagnosis not present

## 2021-10-14 DIAGNOSIS — Z96611 Presence of right artificial shoulder joint: Secondary | ICD-10-CM

## 2021-10-14 NOTE — Progress Notes (Signed)
Orthopaedic Postop Note  Assessment: Sarah Cross is a 41 y.o. female s/p right shoulder arthroscopy, superior labral repair and biceps tenotomy  DOS: 10/02/2021  Plan: Sutures were removed and Steri-Strips were placed. Procedure was discussed in great detail. Continue with medications as needed. Polar Care as needed. Remain in the sling for an additional 2 weeks. Referral placed for physical therapy. Etiology of left foot swelling is unclear at this time.  We will continue to monitor. Follow-up in 4 weeks.   Follow-up: Return in about 4 weeks (around 11/11/2021). XR at next visit: None  Subjective:  Chief Complaint  Patient presents with   Routine Post Op    DOS 10/02/21 RT shoulder arthroscopy    History of Present Illness: Sarah Cross is a 41 y.o. female who presents following the above stated procedure.  Right shoulder arthroscopy was completed approximately 2 weeks ago.  She has done very well since surgery.  She states that her shoulder feels a lot better already.  She is relying on Tylenol, with occasional oxycodone.  She has remained in the sling.  In addition, she notes that she has some swelling in the left foot.  This has improved with elevation.  She has no tenderness within the left calf.  No shortness of breath.  Initially, the swelling was to the foot, but is now mostly around her ankle.  No swelling in her right foot.  Review of Systems: No fevers or chills No numbness or tingling No Chest Pain No shortness of breath   Objective: Ht 5\' 1"  (1.549 m)    Wt 161 lb (73 kg)    LMP 09/16/2021    BMI 30.42 kg/m   Physical Exam:  Alert and oriented.  No acute distress.  Evaluation of right shoulder demonstrates well-healing surgical incisions.  No surrounding erythema or drainage.  Sensation is intact in the axillary nerve distribution.  Sensation is intact throughout the right hand.  2+ radial pulse.  No tenderness to palpation around the  shoulder.  Biceps contour appears normal.    IMAGING: I personally ordered and reviewed the following images:  X-rays of the right shoulder obtained in clinic today.  Glenohumeral joint is reduced.  Drill holes for recent surgery are not visualized.  No proximal humeral migration.  No acute injuries are noted.  Impression: Normal right shoulder x-ray.   Mordecai Rasmussen, MD 10/14/2021 12:42 PM

## 2021-10-14 NOTE — Patient Instructions (Signed)
Continue to use the sling for another 2 weeks  Ok to shower.  Let soap and water run over the incisions.  Do not scrub the incisions.  Gently dry incisions when done.   Work with PT.  They will help you come out of the sling.   Follow up in 4 weeks.

## 2021-10-22 ENCOUNTER — Encounter (HOSPITAL_COMMUNITY): Payer: Self-pay | Admitting: Occupational Therapy

## 2021-10-22 ENCOUNTER — Other Ambulatory Visit: Payer: Self-pay

## 2021-10-22 ENCOUNTER — Ambulatory Visit (HOSPITAL_COMMUNITY): Payer: Medicaid Other | Attending: Orthopedic Surgery | Admitting: Occupational Therapy

## 2021-10-22 DIAGNOSIS — G8929 Other chronic pain: Secondary | ICD-10-CM | POA: Insufficient documentation

## 2021-10-22 DIAGNOSIS — M25611 Stiffness of right shoulder, not elsewhere classified: Secondary | ICD-10-CM | POA: Diagnosis present

## 2021-10-22 DIAGNOSIS — M25511 Pain in right shoulder: Secondary | ICD-10-CM | POA: Insufficient documentation

## 2021-10-22 DIAGNOSIS — R29898 Other symptoms and signs involving the musculoskeletal system: Secondary | ICD-10-CM | POA: Insufficient documentation

## 2021-10-22 NOTE — Patient Instructions (Signed)
1) SHOULDER: Flexion On Table   Place hands on towel placed on table, elbows straight. Lean forward with you upper body, pushing towel away from body.  __10-15_ reps per set, __2-3_ sets per day  2) Abduction (Passive)   With arm out to side, resting on towel placed on table with palm DOWN, keeping trunk away from table, lean to the side while pushing towel away from body.  Repeat __10-15__ times. Do __2-3__ sessions per day.  Copyright  VHI. All rights reserved.     3) Internal Rotation (Assistive)   Seated with elbow bent at right angle and held against side, slide arm on table surface in an inward arc keeping elbow anchored in place. Repeat __10-15__ times. Do __2-3__ sessions per day. Activity: Use this motion to brush crumbs off the table.  Copyright  VHI. All rights reserved.   

## 2021-10-22 NOTE — Therapy (Signed)
[place], [place], [address] Phone: [phone]   Fax:  [phone]  Occupational Therapy Evaluation  Patient Details  Name: Sarah Cross MRN: [id number] Date of Birth: Nov 03, 1980 Referring Provider (OT): Dr. Larena Glassman   Encounter Date: 10/22/2021   OT End of Session - 10/22/21 0938     Visit Number 1    Number of Visits 16    Date for OT Re-Evaluation 12/21/21   mini-reassessment 11/22/2021   Authorization Type HB Medicaid    Authorization Time Period Requesting 16 visits    Authorization - Visit Number 0    Authorization - Number of Visits 16    OT Start Time 0901    OT Stop Time 0937    OT Time Calculation (min) 36 min    Activity Tolerance Patient tolerated treatment well    Behavior During Therapy [facility] 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)    Hypertension    Nausea 12/26/2014   PONV (postoperative nausea and vomiting)    Pregnant 12/26/2014   Sickle cell anemia ([place])    has trait   Spotting during pregnancy in first trimester 12/26/2014   Vaginal Pap smear, abnormal     Past Surgical History:  Procedure Laterality Date   BLADDER TUMOR EXCISION     CESAREAN SECTION     CESAREAN SECTION N/A 07/20/2015   Procedure: REPEAT CESAREAN SECTION;  Surgeon: Jonnie Kind, MD;  Location: [facility];  Service: Obstetrics;  Laterality: N/A;   CSF SHUNT     MASS EXCISION N/A 07/04/2021   Procedure: EXCISION MASS; ABDOMEN;  Surgeon: Aviva Signs, MD;  Location: [facility];  Service: General;  Laterality: N/A;   SHOULDER ARTHROSCOPY WITH LABRAL REPAIR Right 10/02/2021   Procedure: SHOULDER ARTHROSCOPY WITH LABRAL REPAIR,  BICEPS TENOTOMY;  Surgeon: Mordecai Rasmussen, MD;  Location: [facility];  Service: Orthopedics;  Laterality: Right;    There were no vitals filed for this visit.   Subjective Assessment - 10/22/21 0909     Subjective  S: The pain is better than before surgery.    Pertinent History Pt is a 41 y/o female s/p right shoulder arthroscopy, superior labral repair and biceps tenotomy on 10/02/21. Pt presents in sling, reports she is having pain but it is better than prior to sx. Pt was referred to occupational therapy for evaluation and treatment by Dr. Larena Glassman.    Special Tests DASH: 72.73    Patient Stated Goals To have less pain and be able to use my arm    Currently in Pain? Yes    Pain Score 4     Pain Location Shoulder    Pain Orientation Right    Pain Descriptors / Indicators Aching;Burning    Pain Type Surgical pain    Pain Radiating Towards N/A    Pain Onset 1 to 4 weeks ago    Pain Frequency Constant    Aggravating Factors  putting sling back on after showers    Pain Relieving Factors OTC pain medicine    Effect of Pain on Daily Activities  unable to use RUE for ADLs    Multiple Pain Sites No               [facility] OT Assessment - 10/22/21 0900       Assessment   Medical Diagnosis s/p right shoulder arthroscopy, superior labral repair and biceps tenotomy    Referring Provider (OT) Dr. Larena Glassman    Onset Date/Surgical Date 10/02/21    Hand Dominance Right    Next MD Visit 11/03/21    Prior Therapy None for this sx      Precautions   Precautions Shoulder    Type of Shoulder Precautions See protocol.    Shoulder Interventions Shoulder sling/immobilizer;Off for dressing/bathing/exercises      Balance Screen   Has the patient fallen in the past 6 months No      Prior Function   Level of Independence Independent    Vocation Full time employment    Vocation Requirements [employer]-76 and 62 year olds (plans to return at [employer])    Leisure walking,       ADL   ADL comments Pt is unable to use RUE for any ADLs. Sleeping is better than prior to sx. Putting shirts on is very painful      Written Expression   Dominant Hand Right      Cognition   Overall Cognitive Status Within Functional Limits for tasks assessed      Observation/Other Assessments   Quick DASH  72.73      ROM / Strength   AROM / PROM / Strength AROM;PROM;Strength      Palpation   Palpation comment mod to max fascial restrictions along upper arm, trapezius, and scapular regions      AROM   Overall AROM  Deficits;Unable to assess;Due to precautions      PROM   Overall PROM Comments Assessed supine, er/IR abducted    PROM Assessment Site Shoulder    Right/Left Shoulder Right    Right Shoulder Flexion 105 Degrees    Right Shoulder ABduction 76 Degrees    Right Shoulder Internal Rotation 90 Degrees    Right Shoulder External Rotation 40 Degrees      Strength   Overall Strength Deficits;Unable to assess;Due to precautions                 Katina Dung - 10/22/21 9163     Open a tight or new jar Unable    Do heavy household chores (wash walls, wash floors) Unable    Carry a shopping bag or briefcase Unable    Wash your back Unable    Use a knife to cut food Unable    Recreational activities in which you take some force or impact through your arm, shoulder, or hand (golf, hammering, tennis) Unable    During the past week, to what extent has your arm, shoulder or hand problem interfered with your normal social activities with family, friends, neighbors, or groups? Modererately    During the past week, to what extent has your arm, shoulder or hand problem limited your work or other regular daily activities Quite a bit    Arm, shoulder, or hand pain. Severe    Tingling (pins and needles) in your arm, shoulder, or hand None    Difficulty Sleeping No difficulty    DASH Score 72.73 %  OT Education - 10/22/21 [id number]      Education Details table slides    Person(s) Educated Patient    Methods Explanation;Demonstration;Handout    Comprehension Verbalized understanding;Returned demonstration              OT Short Term Goals - 10/22/21 0941       OT SHORT TERM GOAL #1   Title Pt will be provided with and educated on HEP to improve ability to perform ADL tasks using RUE as dominant.    Time 4    Period Weeks    Status New    Target Date 11/21/21      OT SHORT TERM GOAL #2   Title Pt will increase RUE P/ROM to [facility] to improve ability to perform bathing and dressing tasks.    Time 4    Period Weeks    Status New      OT SHORT TERM GOAL #3   Title Pt will increase RUE strength to 3/5 to improve ability to reach items at waist to chest level.    Time 4    Period Weeks    Status New      OT SHORT TERM GOAL #4   Title Pt will decrease RUE fascial restrictions to min amounts to improve mobility required for functional reaching tasks.    Time 4    Period Weeks    Status On-going      OT SHORT TERM GOAL #5   Title Pt will increase RUE strength to 4/5 or greater to improve ability to lift objects at work and at home.    Time 4    Period Weeks    Status On-going               OT Long Term Goals - 10/22/21 1026       OT LONG TERM GOAL #1   Title Pt will decrease pain in RUE to 3/10 or less to improve ability to use RUE as dominant during ADL completion.    Time 8    Period Weeks    Status New    Target Date 12/21/21      OT LONG TERM GOAL #2   Title Pt will decrease RUE fascial restrictions to min amounts or less to improve mobility required for functional reaching tasks.    Time 8    Period Weeks    Status New      OT LONG TERM GOAL #3   Title Pt will increase RUE A/ROM to [facility] to improve ability to reach overhead and behind back when performing bathing and dressing tasks.    Time 8    Period Weeks    Status New      OT LONG TERM GOAL #4   Title Pt will increase RUE strength  to 4+/5 or greater to improve ability to complete work tasks with small children.    Time 8    Period Weeks    Status New                   Plan - 10/22/21 0939     Clinical Impression Statement A: Pt is a 41 y/o female s/p right shoulder arthroscopy, superior labral repair and biceps tenotomy on 10/02/21. Pt is unable to use her dominant RUE for any ADLs or functional tasks at this time.    OT Occupational Profile and History Problem Focused Assessment - Including review of records relating to presenting problem  Occupational performance deficits (Please refer to evaluation for details): ADL's;IADL's;Rest and Sleep;Work;Leisure    Body Structure / Function / Physical Skills ADL;Endurance;Muscle spasms;UE functional use;Fascial restriction;Pain;ROM;IADL;Strength    Rehab Potential Good    Clinical Decision Making Limited treatment options, no task modification necessary    Comorbidities Affecting Occupational Performance: None    Modification or Assistance to Complete Evaluation  No modification of tasks or assist necessary to complete eval    OT Frequency 2x / week    OT Duration 8 weeks    OT Treatment/Interventions Self-care/ADL training;Ultrasound;DME and/or AE instruction;Patient/family education;Passive range of motion;Cryotherapy;Electrical Stimulation;Moist Heat;Therapeutic exercise;Manual Therapy;Therapeutic activities    Plan P: Pt will benefit from skilled OT services to decrease pain and fascial restrictions, increase joint ROM, strength, and functional use of RUE. Treatment plan: myofascial release, manual techniques, P/ROM, AA/ROM, A/ROM, general RUE strengthening scapular mobility/stability/strengthening, modalities prn    OT Home Exercise Plan eval: table slides    Consulted and Agree with Plan of Care Patient             Patient will benefit from skilled therapeutic intervention in order to improve the following deficits and impairments:   Body Structure /  Function / Physical Skills: ADL, Endurance, Muscle spasms, UE functional use, Fascial restriction, Pain, ROM, IADL, Strength       Visit Diagnosis: Acute pain of right shoulder  Stiffness of right shoulder, not elsewhere classified  Other symptoms and signs involving the musculoskeletal system    Problem List Patient Active Problem List   Diagnosis Date Noted   Encounter for surveillance of contraceptive pills 10/01/2021   Routine general medical examination at a health care facility 10/01/2021   BV (bacterial vaginosis) 05/19/2021   Abnormal uterine bleeding (AUB) 05/19/2021   Keloid 05/19/2021   Encounter for menstrual regulation 05/19/2021   Dizzy spells 08/07/2020   Hypertension 08/07/2020   Encounter for screening fecal occult blood testing 08/07/2020   Encounter for well woman exam with routine gynecological exam 08/07/2020   Menorrhagia with irregular cycle 08/05/2018   Pregnancy examination or test, negative result 08/05/2018   Routine cervical smear 08/05/2018   Encounter for gynecological examination with Papanicolaou smear of cervix 08/05/2018   Abnormal skin growth 08/05/2018   History of DVT of lower extremity    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

## 2021-10-24 ENCOUNTER — Other Ambulatory Visit: Payer: Self-pay

## 2021-10-24 ENCOUNTER — Ambulatory Visit (HOSPITAL_COMMUNITY): Payer: Medicaid Other

## 2021-10-24 DIAGNOSIS — M25511 Pain in right shoulder: Secondary | ICD-10-CM

## 2021-10-24 DIAGNOSIS — M25611 Stiffness of right shoulder, not elsewhere classified: Secondary | ICD-10-CM

## 2021-10-24 DIAGNOSIS — R29898 Other symptoms and signs involving the musculoskeletal system: Secondary | ICD-10-CM

## 2021-10-24 NOTE — Therapy (Signed)
[place], [place], [address] Phone: [phone]   Fax:  [phone]  Occupational Therapy Treatment  Patient Details  Name: Sarah Cross MRN: [id number] Date of Birth: 09/05/1981 Referring Provider (OT): Dr. Larena Glassman   Encounter Date: 10/24/2021   OT End of Session - 10/24/21 0840     Visit Number 2    Number of Visits 16    Date for OT Re-Evaluation 12/21/21   mini-reassessment 11/22/2021   Authorization Type HB Medicaid    Authorization Time Period Requesting 16 visits    Authorization - Visit Number 0    Authorization - Number of Visits 16    OT Start Time 0815    OT Stop Time 7209    OT Time Calculation (min) 38 min    Activity Tolerance Patient tolerated treatment well    Behavior During Therapy [facility] 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   Hydrocephalus in newborn [facility])    Hypertension    Nausea 12/26/2014   PONV (postoperative nausea and vomiting)    Pregnant 12/26/2014   Sickle cell anemia ([place])    has trait   Spotting during pregnancy in first trimester 12/26/2014   Vaginal Pap smear, abnormal     Past Surgical History:  Procedure Laterality Date   BLADDER TUMOR EXCISION     CESAREAN SECTION     CESAREAN SECTION N/A 07/20/2015   Procedure: REPEAT CESAREAN SECTION;  Surgeon: Jonnie Kind, MD;  Location: [facility];  Service: Obstetrics;  Laterality: N/A;   CSF SHUNT     MASS EXCISION N/A 07/04/2021   Procedure: EXCISION MASS; ABDOMEN;  Surgeon: Aviva Signs, MD;  Location: [facility];  Service: General;  Laterality: N/A;   SHOULDER ARTHROSCOPY WITH LABRAL REPAIR Right 10/02/2021   Procedure: SHOULDER ARTHROSCOPY WITH LABRAL REPAIR,  BICEPS TENOTOMY;  Surgeon: Mordecai Rasmussen, MD;  Location: [facility];  Service: Orthopedics;  Laterality: Right;    There were no vitals filed for this visit.       [facility] OT Assessment - 10/24/21 0840       Assessment   Medical Diagnosis s/p right shoulder arthroscopy, superior labral repair and biceps tenotomy      Precautions   Precautions Shoulder    Type of Shoulder Precautions See protocol in media tab    Shoulder Interventions Shoulder sling/immobilizer;Off for dressing/bathing/exercises                      OT Treatments/Exercises (OP) - 10/24/21 0834       Exercises   Exercises Shoulder      Shoulder Exercises: Supine   Protraction PROM;5 reps    Horizontal ABduction PROM;5 reps    External Rotation PROM;5 reps    Internal Rotation PROM;5 reps    Flexion PROM;5 reps    ABduction PROM;5 reps    Other Supine Exercises Elbow flexion/extension 10X A/ROM      Shoulder Exercises: Seated   Extension AROM;10 reps    Row AROM;10 reps    Other Seated Exercises scapular depression; A/ROM 10X      Shoulder  Exercises: Therapy Ball   Flexion 10 reps   2" hold at end stretch   ABduction 10 reps   2" hold at end of stretch     Shoulder Exercises: ROM/Strengthening   Thumb Tacks 1' low level back of chair      Shoulder Exercises: Isometric Strengthening   External Rotation Supine;3X3"    Internal Rotation Supine;3X3"      Manual Therapy   Manual Therapy Myofascial release    Manual therapy comments Manual therapy completed prior to exercises    Myofascial Release Myofascial release and manual stretching completed to the right upper arm, uppe trapezius, and scapularis region to decrease fascial restrictions and increase joint mobility in a pain free zone.                      OT Short Term Goals - 10/24/21 0819       OT SHORT TERM GOAL #1   Title Pt will be provided with and educated on HEP to improve ability to perform ADL tasks using RUE as  dominant.    Time 4    Period Weeks    Status On-going    Target Date 11/21/21      OT SHORT TERM GOAL #2   Title Pt will increase RUE P/ROM to [facility] to improve ability to perform bathing and dressing tasks.    Time 4    Period Weeks    Status On-going      OT SHORT TERM GOAL #3   Title Pt will increase RUE strength to 3/5 to improve ability to reach items at waist to chest level.    Time 4    Period Weeks    Status On-going      OT SHORT TERM GOAL #4   Title Pt will decrease RUE fascial restrictions to min amounts to improve mobility required for functional reaching tasks.    Time 4    Period Weeks    Status On-going      OT SHORT TERM GOAL #5   Title Pt will increase RUE strength to 4/5 or greater to improve ability to lift objects at work and at home.    Time 4    Period Weeks    Status On-going               OT Long Term Goals - 10/24/21 0819       OT LONG TERM GOAL #1   Title Pt will decrease pain in RUE to 3/10 or less to improve ability to use RUE as dominant during ADL completion.    Time 8    Period Weeks    Status On-going    Target Date 12/21/21      OT LONG TERM GOAL #2   Title Pt will decrease RUE fascial restrictions to min amounts or less to improve mobility required for functional reaching tasks.    Time 8    Period Weeks    Status On-going      OT LONG TERM GOAL #3   Title Pt will increase RUE A/ROM to [facility] to improve ability to reach overhead and behind back when performing bathing and dressing tasks.    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  Time 8    Period Weeks    Status On-going                   Plan - 10/24/21 0841     Clinical Impression Statement A: Myofascial release completed to address moderate fascial restrictions in the right upper arm, upper trapezius and  scapularis region. VC were needed to decrease active movement and muscle guarding during passive ROM. Difficulty isolating scapular muscles when seated. Tactile and verbal cues required.    Body Structure / Function / Physical Skills ADL;Endurance;Muscle spasms;UE functional use;Fascial restriction;Pain;ROM;IADL;Strength    Plan P: Continue to follow protocol. Decrease fascial restrictions and increase ROM where needed. Add additional isometrics supine with the exception of flexion.    Consulted and Agree with Plan of Care Patient             Patient will benefit from skilled therapeutic intervention in order to improve the following deficits and impairments:   Body Structure / Function / Physical Skills: ADL, Endurance, Muscle spasms, UE functional use, Fascial restriction, Pain, ROM, IADL, Strength       Visit Diagnosis: Acute pain of right shoulder  Stiffness of right shoulder, not elsewhere classified  Other symptoms and signs involving the musculoskeletal system    Problem List Patient Active Problem List   Diagnosis Date Noted   Encounter for surveillance of contraceptive pills 10/01/2021   Routine general medical examination at a health care facility 10/01/2021   BV (bacterial vaginosis) 05/19/2021   Abnormal uterine bleeding (AUB) 05/19/2021   Keloid 05/19/2021   Encounter for menstrual regulation 05/19/2021   Dizzy spells 08/07/2020   Hypertension 08/07/2020   Encounter for screening fecal occult blood testing 08/07/2020   Encounter for well woman exam with routine gynecological exam 08/07/2020   Menorrhagia with irregular cycle 08/05/2018   Pregnancy examination or test, negative result 08/05/2018   Routine cervical smear 08/05/2018   Encounter for gynecological examination with Papanicolaou smear of cervix 08/05/2018   Abnormal skin growth 08/05/2018   History of DVT of lower extremity    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

## 2021-10-27 ENCOUNTER — Telehealth (HOSPITAL_COMMUNITY): Payer: Self-pay | Admitting: Occupational Therapy

## 2021-10-27 ENCOUNTER — Other Ambulatory Visit: Payer: Self-pay

## 2021-10-27 ENCOUNTER — Encounter (HOSPITAL_COMMUNITY): Payer: Self-pay | Admitting: Occupational Therapy

## 2021-10-27 ENCOUNTER — Ambulatory Visit (HOSPITAL_COMMUNITY): Payer: Medicaid Other | Admitting: Occupational Therapy

## 2021-10-27 DIAGNOSIS — G8929 Other chronic pain: Secondary | ICD-10-CM

## 2021-10-27 DIAGNOSIS — M25511 Pain in right shoulder: Secondary | ICD-10-CM

## 2021-10-27 DIAGNOSIS — R29898 Other symptoms and signs involving the musculoskeletal system: Secondary | ICD-10-CM

## 2021-10-27 DIAGNOSIS — M25611 Stiffness of right shoulder, not elsewhere classified: Secondary | ICD-10-CM

## 2021-10-27 NOTE — Telephone Encounter (Signed)
Pt reported she was late coming due to needing to wait for her ride. Pt was informed that it was past 15 minutes and she would need to reschedule. Pt reported she would do so at the front desk since she was just pulling into the clinic.   Aubri Gathright OT, MOT

## 2021-10-27 NOTE — Therapy (Signed)
[place], [place], [address] Phone: [phone]   Fax:  [phone]  Occupational Therapy Treatment  Patient Details  Name: Sarah Cross MRN: [id number] Date of Birth: 02-10-1981 Referring Provider (OT): Dr. Larena Glassman   Encounter Date: 10/27/2021   OT End of Session - 10/27/21 1612     Visit Number 3    Number of Visits 16    Date for OT Re-Evaluation 12/21/21   mini-reassessment 11/22/2021   Authorization Type HB Medicaid    Authorization Time Period Requesting 16 visits    Authorization - Visit Number 0    Authorization - Number of Visits 16    OT Start Time 2831    OT Stop Time 1602    OT Time Calculation (min) 44 min    Activity Tolerance Patient tolerated treatment well    Behavior During Therapy [facility] 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   Hydrocephalus in newborn [facility])    Hypertension    Nausea 12/26/2014   PONV (postoperative nausea and vomiting)    Pregnant 12/26/2014   Sickle cell anemia ([place])    has trait   Spotting during pregnancy in first trimester 12/26/2014   Vaginal Pap smear, abnormal     Past Surgical History:  Procedure Laterality Date   BLADDER TUMOR EXCISION     CESAREAN SECTION     CESAREAN SECTION N/A 07/20/2015   Procedure: REPEAT CESAREAN SECTION;  Surgeon: Jonnie Kind, MD;  Location: [facility];  Service: Obstetrics;  Laterality: N/A;   CSF SHUNT     MASS EXCISION N/A 07/04/2021   Procedure: EXCISION MASS; ABDOMEN;  Surgeon: Aviva Signs, MD;  Location: [facility];  Service: General;  Laterality: N/A;   SHOULDER ARTHROSCOPY WITH LABRAL REPAIR Right 10/02/2021   Procedure: SHOULDER ARTHROSCOPY WITH LABRAL REPAIR,  BICEPS TENOTOMY;  Surgeon: Mordecai Rasmussen, MD;  Location: [facility];  Service: Orthopedics;  Laterality: Right;    There were no vitals filed for this visit.   Subjective Assessment - 10/27/21 1519     Subjective  S: I took medicine before this. I tried to put my hair up.    Currently in Pain? Yes    Pain Score 3     Pain Location Shoulder    Pain Orientation Right    Pain Descriptors / Indicators Aching;Burning    Pain Type Surgical pain    Pain Onset 1 to 4 weeks ago    Pain Frequency Constant    Aggravating Factors  putting sling on and off    Pain Relieving Factors OTC pain medicine    Effect of Pain on Daily Activities Unable to use R UE for ADL's    Multiple Pain Sites No                [facility] OT Assessment - 10/27/21 0001       Assessment   Medical Diagnosis s/p right shoulder arthroscopy, superior labral repair and biceps tenotomy      Precautions   Precautions Shoulder    Type of Shoulder Precautions See protocol in media tab  Shoulder Interventions Shoulder sling/immobilizer;Off for dressing/bathing/exercises                      OT Treatments/Exercises (OP) - 10/27/21 0001       Exercises   Exercises Shoulder      Shoulder Exercises: Supine   Horizontal ABduction PROM;5 reps    External Rotation PROM;5 reps    Internal Rotation PROM;5 reps    Flexion PROM;5 reps    ABduction PROM;5 reps      Shoulder Exercises: Seated   Extension --    Row --      Shoulder Exercises: Therapy Ball   Flexion 10 reps   3" hold and stretch   ABduction 10 reps   3" hold and stretch     Shoulder Exercises: Isometric Strengthening   Extension 3X3";Supine    External Rotation Supine;3X3"    Internal Rotation Supine;3X3"    ABduction 3X3";Supine    ADduction 3X3";Supine      Manual Therapy   Manual Therapy Myofascial release    Manual therapy comments Manual therapy completed prior to exercises    Myofascial Release Myofascial release and manual  stretching completed to the right upper arm, uppe trapezius, and scapularis region to decrease fascial restrictions and increase joint mobility in a pain free zone.                      OT Short Term Goals - 10/24/21 0819       OT SHORT TERM GOAL #1   Title Pt will be provided with and educated on HEP to improve ability to perform ADL tasks using RUE as dominant.    Time 4    Period Weeks    Status On-going    Target Date 11/21/21      OT SHORT TERM GOAL #2   Title Pt will increase RUE P/ROM to [facility] to improve ability to perform bathing and dressing tasks.    Time 4    Period Weeks    Status On-going      OT SHORT TERM GOAL #3   Title Pt will increase RUE strength to 3/5 to improve ability to reach items at waist to chest level.    Time 4    Period Weeks    Status On-going      OT SHORT TERM GOAL #4   Title Pt will decrease RUE fascial restrictions to min amounts to improve mobility required for functional reaching tasks.    Time 4    Period Weeks    Status On-going      OT SHORT TERM GOAL #5   Title Pt will increase RUE strength to 4/5 or greater to improve ability to lift objects at work and at home.    Time 4    Period Weeks    Status On-going               OT Long Term Goals - 10/24/21 0819       OT LONG TERM GOAL #1   Title Pt will decrease pain in RUE to 3/10 or less to improve ability to use RUE as dominant during ADL completion.    Time 8    Period Weeks    Status On-going    Target Date 12/21/21      OT LONG TERM GOAL #2   Title Pt will decrease RUE fascial restrictions to min amounts or less to improve mobility required for functional reaching  tasks.    Time 8    Period Weeks    Status On-going      OT LONG TERM GOAL #3   Title Pt will increase RUE A/ROM to [facility] to improve ability to reach overhead and behind back when performing bathing and dressing tasks.    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    Time 8    Period Weeks    Status On-going                   Plan - 10/27/21 1614     Clinical Impression Statement A: Myofascial release completed to address moderate fascial restrictions in R upper arm, upper trapezius, and scapularis region. Pt noted to continue having diffiuclty with muscle guarding. Increased pain noted with abduction and external rotation PROM. Pt reported that she tried to put uper her hair this morning actively using her R UE. Pt educated to not actively use R UE at this time per the protocol. Verbal cuing for theraball stretching. P/ROM required extended time due to pt increase in pain. Isometrics completed in supine with the exception of flexion.    Body Structure / Function / Physical Skills ADL;Endurance;Muscle spasms;UE functional use;Fascial restriction;Pain;ROM;IADL;Strength    OT Treatment/Interventions Self-care/ADL training;Ultrasound;DME and/or AE instruction;Patient/family education;Passive range of motion;Cryotherapy;Electrical Stimulation;Moist Heat;Therapeutic exercise;Manual Therapy;Therapeutic activities    Plan P: Continue to follow protocol. Decrease fascial restrictions and increase ROM where needed. Continue isometrics. Possibly attempt pendulum exercise per pt request for other ways to decrease stiffness.    OT Home Exercise Plan eval: table slides             Patient will benefit from skilled therapeutic intervention in order to improve the following deficits and impairments:   Body Structure / Function / Physical Skills: ADL, Endurance, Muscle spasms, UE functional use, Fascial restriction, Pain, ROM, IADL, Strength       Visit Diagnosis: Acute pain of right shoulder  Stiffness of right shoulder, not elsewhere classified  Other symptoms and signs involving the musculoskeletal system  Chronic right shoulder  pain    Problem List Patient Active Problem List   Diagnosis Date Noted   Encounter for surveillance of contraceptive pills 10/01/2021   Routine general medical examination at a health care facility 10/01/2021   BV (bacterial vaginosis) 05/19/2021   Abnormal uterine bleeding (AUB) 05/19/2021   Keloid 05/19/2021   Encounter for menstrual regulation 05/19/2021   Dizzy spells 08/07/2020   Hypertension 08/07/2020   Encounter for screening fecal occult blood testing 08/07/2020   Encounter for well woman exam with routine gynecological exam 08/07/2020   Menorrhagia with irregular cycle 08/05/2018   Pregnancy examination or test, negative result 08/05/2018   Routine cervical smear 08/05/2018   Encounter for gynecological examination with Papanicolaou smear of cervix 08/05/2018   Abnormal skin growth 08/05/2018   History of DVT of lower extremity    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 07/12/2015   Ovarian cyst, left 01/28/2015   Gonorrhea affecting pregnancy in first trimester, antepartum 01/19/2015   Sickle cell trait ([place]) 01/19/2015   H/O cesarean section complicating pregnancy 16/07/9603   Nausea 12/26/2014   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

## 2021-10-28 ENCOUNTER — Encounter (HOSPITAL_COMMUNITY): Payer: Medicaid Other

## 2021-10-31 ENCOUNTER — Encounter (HOSPITAL_COMMUNITY): Payer: Self-pay | Admitting: Occupational Therapy

## 2021-10-31 ENCOUNTER — Ambulatory Visit (HOSPITAL_COMMUNITY): Payer: Medicaid Other | Admitting: Occupational Therapy

## 2021-10-31 ENCOUNTER — Encounter: Payer: Self-pay | Admitting: Adult Health

## 2021-10-31 ENCOUNTER — Other Ambulatory Visit: Payer: Self-pay

## 2021-10-31 DIAGNOSIS — M25511 Pain in right shoulder: Secondary | ICD-10-CM

## 2021-10-31 DIAGNOSIS — R29898 Other symptoms and signs involving the musculoskeletal system: Secondary | ICD-10-CM

## 2021-10-31 DIAGNOSIS — M25611 Stiffness of right shoulder, not elsewhere classified: Secondary | ICD-10-CM

## 2021-10-31 NOTE — Therapy (Signed)
[place], [place], [address] Phone: [phone]   Fax:  [phone]  Occupational Therapy Treatment  Patient Details  Name: Sarah Cross MRN: [id number] Date of Birth: 1981-08-12 Referring Provider (OT): Dr. Larena Glassman   Encounter Date: 10/31/2021   OT End of Session - 10/31/21 0848     Visit Number 4    Number of Visits 16    Date for OT Re-Evaluation 12/21/21   mini-reassessment 11/22/2021   Authorization Type HB Medicaid    Authorization Time Period Requesting 16 visits    Authorization - Visit Number 0    Authorization - Number of Visits 16    OT Start Time 0816    OT Stop Time 0856    OT Time Calculation (min) 40 min    Activity Tolerance Patient tolerated treatment well    Behavior During Therapy [facility] 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   Hydrocephalus in newborn [facility])    Hypertension    Nausea 12/26/2014   PONV (postoperative nausea and vomiting)    Pregnant 12/26/2014   Sickle cell anemia ([place])    has trait   Spotting during pregnancy in first trimester 12/26/2014   Vaginal Pap smear, abnormal     Past Surgical History:  Procedure Laterality Date   BLADDER TUMOR EXCISION     CESAREAN SECTION     CESAREAN SECTION N/A 07/20/2015   Procedure: REPEAT CESAREAN SECTION;  Surgeon: Jonnie Kind, MD;  Location: [facility];  Service: Obstetrics;  Laterality: N/A;   CSF SHUNT     MASS EXCISION N/A 07/04/2021   Procedure: EXCISION MASS; ABDOMEN;  Surgeon: Aviva Signs, MD;  Location: [facility];  Service: General;  Laterality: N/A;   SHOULDER ARTHROSCOPY WITH LABRAL REPAIR Right 10/02/2021   Procedure: SHOULDER ARTHROSCOPY WITH LABRAL REPAIR,  BICEPS TENOTOMY;  Surgeon: Mordecai Rasmussen, MD;  Location: [facility];  Service: Orthopedics;  Laterality: Right;    There were no vitals filed for this visit.   Subjective Assessment - 10/31/21 0817     Subjective  S: I slept on and off last night.    Currently in Pain? Yes    Pain Score 6     Pain Location Shoulder    Pain Orientation Right    Pain Descriptors / Indicators Aching;Burning    Pain Type Surgical pain    Pain Radiating Towards N/A    Pain Onset 1 to 4 weeks ago    Pain Frequency Constant    Aggravating Factors  donning/doffing sling    Pain Relieving Factors OTC pain medication    Effect of Pain on Daily Activities unable to use RUE for ADLs    Multiple Pain Sites No                [facility] OT Assessment - 10/31/21 0817       Assessment   Medical Diagnosis s/p right shoulder arthroscopy, superior labral repair and biceps tenotomy      Precautions   Precautions Shoulder    Type of Shoulder Precautions See protocol in media tab    Shoulder Interventions  Shoulder sling/immobilizer;Off for dressing/bathing/exercises                      OT Treatments/Exercises (OP) - 10/31/21 0819       Exercises   Exercises Shoulder      Shoulder Exercises: Supine   Protraction PROM;5 reps    Horizontal ABduction PROM;5 reps    External Rotation PROM;5 reps    Internal Rotation PROM;5 reps    Flexion PROM;5 reps    ABduction PROM;5 reps      Shoulder Exercises: Seated   Extension AROM;10 reps    Row AROM;10 reps    Other Seated Exercises scapular depression; A/ROM 10X      Shoulder Exercises: Therapy Ball   Flexion 10 reps   3" hold at end stretch   ABduction 10 reps   3" hold at end stretch     Manual Therapy   Manual Therapy Myofascial release    Manual therapy comments Manual therapy completed prior to exercises    Myofascial Release Myofascial release and manual stretching completed to the right upper arm, uppe trapezius, and scapularis region to  decrease fascial restrictions and increase joint mobility in a pain free zone.                      OT Short Term Goals - 10/24/21 0819       OT SHORT TERM GOAL #1   Title Pt will be provided with and educated on HEP to improve ability to perform ADL tasks using RUE as dominant.    Time 4    Period Weeks    Status On-going    Target Date 11/21/21      OT SHORT TERM GOAL #2   Title Pt will increase RUE P/ROM to [facility] to improve ability to perform bathing and dressing tasks.    Time 4    Period Weeks    Status On-going      OT SHORT TERM GOAL #3   Title Pt will increase RUE strength to 3/5 to improve ability to reach items at waist to chest level.    Time 4    Period Weeks    Status On-going      OT SHORT TERM GOAL #4   Title Pt will decrease RUE fascial restrictions to min amounts to improve mobility required for functional reaching tasks.    Time 4    Period Weeks    Status On-going      OT SHORT TERM GOAL #5   Title Pt will increase RUE strength to 4/5 or greater to improve ability to lift objects at work and at home.    Time 4    Period Weeks    Status On-going               OT Long Term Goals - 10/24/21 0819       OT LONG TERM GOAL #1   Title Pt will decrease pain in RUE to 3/10 or less to improve ability to use RUE as dominant during ADL completion.    Time 8    Period Weeks    Status On-going    Target Date 12/21/21      OT LONG TERM GOAL #2   Title Pt will decrease RUE fascial restrictions to min amounts or less to improve mobility required for functional reaching tasks.    Time 8    Period Weeks    Status On-going  OT LONG TERM GOAL #3   Title Pt will increase RUE A/ROM to [facility] to improve ability to reach overhead and behind back when performing bathing and dressing tasks.    Time 8    Period Weeks    Status On-going      OT LONG TERM GOAL #4   Title Pt will increase RUE strength to 4+/5 or greater to improve ability to complete  work tasks with small children.    Time 8    Period Weeks    Status On-going                   Plan - 10/31/21 0845     Clinical Impression Statement A: Continued with myofascial release to address fascial restrictions. Passive stretching completed with pt tolerating approximately 65% ROM. Continued with scapular A/ROM and therapy ball stretches. Verbal cuing for form and technique.    Body Structure / Function / Physical Skills ADL;Endurance;Muscle spasms;UE functional use;Fascial restriction;Pain;ROM;IADL;Strength    Plan P: Progress to phase II, resume isometrics completing for 3X5", thumb tacks    OT Home Exercise Plan eval: table slides    Consulted and Agree with Plan of Care Patient             Patient will benefit from skilled therapeutic intervention in order to improve the following deficits and impairments:   Body Structure / Function / Physical Skills: ADL, Endurance, Muscle spasms, UE functional use, Fascial restriction, Pain, ROM, IADL, Strength       Visit Diagnosis: Acute pain of right shoulder  Stiffness of right shoulder, not elsewhere classified  Other symptoms and signs involving the musculoskeletal system    Problem List Patient Active Problem List   Diagnosis Date Noted   Encounter for surveillance of contraceptive pills 10/01/2021   Routine general medical examination at a health care facility 10/01/2021   BV (bacterial vaginosis) 05/19/2021   Abnormal uterine bleeding (AUB) 05/19/2021   Keloid 05/19/2021   Encounter for menstrual regulation 05/19/2021   Dizzy spells 08/07/2020   Hypertension 08/07/2020   Encounter for screening fecal occult blood testing 08/07/2020   Encounter for well woman exam with routine gynecological exam 08/07/2020   Menorrhagia with irregular cycle 08/05/2018   Pregnancy examination or test, negative result 08/05/2018   Routine cervical smear 08/05/2018   Encounter for gynecological examination with  Papanicolaou smear of cervix 08/05/2018   Abnormal skin growth 08/05/2018   History of DVT of lower extremity    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

## 2021-11-03 ENCOUNTER — Ambulatory Visit (INDEPENDENT_AMBULATORY_CARE_PROVIDER_SITE_OTHER): Payer: Medicaid Other | Admitting: Orthopedic Surgery

## 2021-11-03 ENCOUNTER — Other Ambulatory Visit: Payer: Self-pay

## 2021-11-03 ENCOUNTER — Ambulatory Visit (HOSPITAL_COMMUNITY): Payer: Medicaid Other

## 2021-11-03 ENCOUNTER — Encounter: Payer: Self-pay | Admitting: Orthopedic Surgery

## 2021-11-03 ENCOUNTER — Telehealth: Payer: Self-pay | Admitting: Orthopedic Surgery

## 2021-11-03 ENCOUNTER — Encounter (HOSPITAL_COMMUNITY): Payer: Self-pay

## 2021-11-03 DIAGNOSIS — R29898 Other symptoms and signs involving the musculoskeletal system: Secondary | ICD-10-CM

## 2021-11-03 DIAGNOSIS — M25511 Pain in right shoulder: Secondary | ICD-10-CM | POA: Diagnosis not present

## 2021-11-03 DIAGNOSIS — M25611 Stiffness of right shoulder, not elsewhere classified: Secondary | ICD-10-CM

## 2021-11-03 DIAGNOSIS — S43431D Superior glenoid labrum lesion of right shoulder, subsequent encounter: Secondary | ICD-10-CM

## 2021-11-03 NOTE — Progress Notes (Signed)
Orthopaedic Postop Note  Assessment: Sarah Cross is a 41 y.o. female s/p right shoulder arthroscopy, superior labral repair and biceps tenotomy  DOS: 10/02/2021  Plan: Tylenol as needed for pain Continue with PT, protocol previously provided.  Provided reassurance, current discomfort not abnormal, still early in recovery Ok to DC sling Return to work in 2 weeks, as previously discussed [place] to drive when comfortable, recommend practicing in parking lot.    Follow-up: Return in about 6 weeks (around 12/15/2021) for [place] . XR at next visit: None  Subjective:  Chief Complaint  Patient presents with   Routine Post Op     s/p right shoulder arthroscopy, superior labral repair and biceps tenotomy DOS 10/02/21    History of Present Illness: Sarah Cross is a 41 y.o. female who presents following the above stated procedure.  Right shoulder arthroscopy was completed approximately 4-5 weeks ago.  She is progressing, but relatively slowly.  She has pain in the shoulder with certain motions.  She is working with physical therapy.  Pain is manageable, and she is taking Tylenol which is effective.  No numbness or tingling.  No issues with her surgical incisions.   Review of Systems: No fevers or chills No numbness or tingling No Chest Pain No shortness of breath   Objective: There were no vitals taken for this visit.  Physical Exam:  Alert and oriented.  Tearful.  Concerned about her recovery.  Evaluation of right shoulder demonstrates well-healing surgical incisions.  No surrounding erythema or drainage.  Sensation is intact in the axillary nerve distribution.  Sensation is intact throughout the right hand.  2+ radial pulse.  No tenderness to palpation around the shoulder.  Biceps contour appears normal.  Forward elevation to approximately 90 degrees.  She states she has pain in this position.  Passively, I can get forward elevation to 160 degrees.   External rotation or side to 45 degrees.  No tenderness to palpation within the biceps.    IMAGING: I personally ordered and reviewed the following images:  No new imaging obtained today.   Mordecai Rasmussen, MD 11/03/2021 11:15 AM

## 2021-11-03 NOTE — Therapy (Signed)
[place], [place], [address] Phone: [phone]   Fax:  [phone]  Occupational Therapy Treatment  Patient Details  Name: Sarah Cross MRN: [id number] Date of Birth: 1981-08-17 Referring Provider (OT): Dr. Larena Glassman   Encounter Date: 11/03/2021   OT End of Session - 11/03/21 1141     Visit Number 5    Number of Visits 16    Date for OT Re-Evaluation 12/21/21   mini-reassessment 11/22/2021   Authorization Type [place] Medicaid    Authorization Time Period Requesting 16 visits    Authorization - Visit Number 0    Authorization - Number of Visits 3    OT Start Time 1128   Pt arrived late   OT Stop Time 58    OT Time Calculation (min) 25 min    Activity Tolerance Patient tolerated treatment well    Behavior During Therapy [facility] 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   Hydrocephalus in newborn [facility])    Hypertension    Nausea 12/26/2014   PONV (postoperative nausea and vomiting)    Pregnant 12/26/2014   Sickle cell anemia ([place])    has trait   Spotting during pregnancy in first trimester 12/26/2014   Vaginal Pap smear, abnormal     Past Surgical History:  Procedure Laterality Date   BLADDER TUMOR EXCISION     CESAREAN SECTION     CESAREAN SECTION N/A 07/20/2015   Procedure: REPEAT CESAREAN SECTION;  Surgeon: Jonnie Kind, MD;  Location: [place] [facility];  Service: Obstetrics;  Laterality: N/A;   CSF SHUNT     MASS EXCISION N/A 07/04/2021   Procedure: EXCISION MASS; ABDOMEN;  Surgeon: Aviva Signs, MD;  Location: [facility];  Service: General;  Laterality: N/A;   SHOULDER ARTHROSCOPY WITH LABRAL REPAIR Right 10/02/2021   Procedure: SHOULDER ARTHROSCOPY WITH  LABRAL REPAIR, BICEPS TENOTOMY;  Surgeon: Mordecai Rasmussen, MD;  Location: [facility];  Service: Orthopedics;  Laterality: Right;    There were no vitals filed for this visit.   Subjective Assessment - 11/03/21 1130     Currently in Pain? Yes    Pain Score 3     Pain Location Shoulder    Pain Orientation Right    Pain Descriptors / Indicators Sore    Pain Type Surgical pain    Pain Onset 1 to 4 weeks ago    Pain Frequency Intermittent    Aggravating Factors  working it during therapy    Pain Relieving Factors OTC pain medication    Effect of Pain on Daily Activities unable to use RUE for ADL tasks.                [facility] OT Assessment - 11/03/21 1140       Assessment   Medical Diagnosis s/p right shoulder arthroscopy, superior labral repair and biceps tenotomy    Next MD Visit 11/18/21      Precautions   Precautions Shoulder    Type of Shoulder Precautions See protocol in media tab  OT Treatments/Exercises (OP) - 11/03/21 1139       Exercises   Exercises Shoulder      Shoulder Exercises: Supine   Protraction PROM;AAROM;10 reps    Flexion PROM;AAROM;10 reps                    OT Education - 11/03/21 1211     Education Details AA/ROM - supine. AA/ROM standing abduction    Person(s) Educated Patient    Methods Explanation;Demonstration;Handout;Verbal cues    Comprehension Verbalized understanding;Returned demonstration              OT Short Term Goals - 10/24/21 0819       OT SHORT TERM GOAL #1   Title Pt will be provided with and educated on HEP to improve ability to perform ADL tasks using RUE as dominant.    Time 4    Period Weeks    Status On-going    Target Date 11/21/21      OT SHORT TERM GOAL #2   Title Pt will increase RUE P/ROM to [facility] to improve ability to perform bathing and dressing tasks.    Time 4    Period Weeks    Status On-going      OT SHORT TERM GOAL #3   Title Pt will increase RUE strength  to 3/5 to improve ability to reach items at waist to chest level.    Time 4    Period Weeks    Status On-going      OT SHORT TERM GOAL #4   Title Pt will decrease RUE fascial restrictions to min amounts to improve mobility required for functional reaching tasks.    Time 4    Period Weeks    Status On-going      OT SHORT TERM GOAL #5   Title Pt will increase RUE strength to 4/5 or greater to improve ability to lift objects at work and at home.    Time 4    Period Weeks    Status On-going               OT Long Term Goals - 10/24/21 0819       OT LONG TERM GOAL #1   Title Pt will decrease pain in RUE to 3/10 or less to improve ability to use RUE as dominant during ADL completion.    Time 8    Period Weeks    Status On-going    Target Date 12/21/21      OT LONG TERM GOAL #2   Title Pt will decrease RUE fascial restrictions to min amounts or less to improve mobility required for functional reaching tasks.    Time 8    Period Weeks    Status On-going      OT LONG TERM GOAL #3   Title Pt will increase RUE A/ROM to [facility] to improve ability to reach overhead and behind back when performing bathing and dressing tasks.    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    Time 8    Period Weeks    Status On-going                   Plan - 11/03/21 1143     Clinical Impression Statement  A: Due to time constraint, focused on ROM and starting new exercise progression of supine AA/ROM. Provided VC for form and technique and when exercise modifications were needed due to pain. HEP was updated.    Body Structure / Function / Physical Skills ADL;Endurance;Muscle spasms;UE functional use;Fascial restriction;Pain;ROM;IADL;Strength    Plan P: follow up on HEP. thumb tacks. isometrics with use of theraband.    OT Home Exercise Plan  eval: table slides 1/23: AA/ROM    Consulted and Agree with Plan of Care Patient             Patient will benefit from skilled therapeutic intervention in order to improve the following deficits and impairments:   Body Structure / Function / Physical Skills: ADL, Endurance, Muscle spasms, UE functional use, Fascial restriction, Pain, ROM, IADL, Strength       Visit Diagnosis: Acute pain of right shoulder  Stiffness of right shoulder, not elsewhere classified  Other symptoms and signs involving the musculoskeletal system    Problem List Patient Active Problem List   Diagnosis Date Noted   Encounter for surveillance of contraceptive pills 10/01/2021   Routine general medical examination at a health care facility 10/01/2021   BV (bacterial vaginosis) 05/19/2021   Abnormal uterine bleeding (AUB) 05/19/2021   Keloid 05/19/2021   Encounter for menstrual regulation 05/19/2021   Dizzy spells 08/07/2020   Hypertension 08/07/2020   Encounter for screening fecal occult blood testing 08/07/2020   Encounter for well woman exam with routine gynecological exam 08/07/2020   Menorrhagia with irregular cycle 08/05/2018   Pregnancy examination or test, negative result 08/05/2018   Routine cervical smear 08/05/2018   Encounter for gynecological examination with Papanicolaou smear of cervix 08/05/2018   Abnormal skin growth 08/05/2018   History of DVT of lower extremity    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 [place], [place], [address] Phone: [phone]   Fax:  [phone]  Name: Sarah Cross MRN: [id number] Date of Birth: 05/30/1981

## 2021-11-03 NOTE — Patient Instructions (Signed)
Perform each exercise ____10-15____ reps. 1-2x days.   1) Protraction   Start by holding a wand or cane at chest height.  Next, slowly push the wand outwards in front of your body so that your elbows become fully straightened. Then, return to the original position.     2) Shoulder FLEXION   In the standing position, hold a wand/cane with both arms, palms down on both sides. Raise up the wand/cane allowing your unaffected arm to perform most of the effort. Your affected arm should be partially relaxed.      3) Internal/External ROTATION   In the standing position, hold a wand/cane with both hands keeping your elbows bent. Move your arms and wand/cane to one side.  Your affected arm should be partially relaxed while your unaffected arm performs most of the effort.       4) Shoulder ABDUCTION - STANDING  While holding a wand/cane palm face up on the injured side and palm face down on the uninjured side, slowly raise up your injured arm to the side.        5) Horizontal Abduction/Adduction      Straight arms holding cane at shoulder height, bring cane to right, center, left. Repeat starting to left.   Copyright  VHI. All rights reserved.

## 2021-11-03 NOTE — Progress Notes (Deleted)
Chief Complaint  Patient presents with   Routine Post Op     s/p right shoulder arthroscopy, superior labral repair and biceps tenotomy DOS 10/02/21    Tylenol doing well   No problems so far

## 2021-11-03 NOTE — Telephone Encounter (Signed)
Done

## 2021-11-03 NOTE — Telephone Encounter (Signed)
Called patient, reviewed, rescheduled appointment accordingly, for 6 weeks follow up; patient also aware to call for her return to work note just prior, so that we may issue it for return 2 weeks from now, 11/18/21.

## 2021-11-03 NOTE — Telephone Encounter (Signed)
At time of check out, questions came up regarding patient's post operative office visit today, 11/03/21. Initially, the after visit instructions indicated 2 weeks, although patient thought Doctor had said 6 weeks; however, patient is still out of work; therefore, I scheduled it for 2 weeks to discuss work status, as patient initially thought she may be okay to return to work in early February. Also, patient asked if she may drive yet?  Please advise.

## 2021-11-05 ENCOUNTER — Encounter (HOSPITAL_COMMUNITY): Payer: Self-pay | Admitting: Occupational Therapy

## 2021-11-05 ENCOUNTER — Other Ambulatory Visit: Payer: Self-pay

## 2021-11-05 ENCOUNTER — Ambulatory Visit (HOSPITAL_COMMUNITY): Payer: Medicaid Other | Admitting: Occupational Therapy

## 2021-11-05 DIAGNOSIS — M25611 Stiffness of right shoulder, not elsewhere classified: Secondary | ICD-10-CM

## 2021-11-05 DIAGNOSIS — R29898 Other symptoms and signs involving the musculoskeletal system: Secondary | ICD-10-CM

## 2021-11-05 DIAGNOSIS — M25511 Pain in right shoulder: Secondary | ICD-10-CM

## 2021-11-05 NOTE — Therapy (Signed)
[place], [place], [address] Phone: [phone]   Fax:  [phone]  Occupational Therapy Treatment  Patient Details  Name: Sarah Cross MRN: [id number] Date of Birth: 04-05-1981 Referring Provider (OT): Dr. Larena Glassman   Encounter Date: 11/05/2021   OT End of Session - 11/05/21 0857     Visit Number 6    Number of Visits 16    Date for OT Re-Evaluation 12/21/21   mini-reassessment 11/22/2021   Authorization Type HB Medicaid    Authorization Time Period 16 visits approved 1/13-3/12/23    Authorization - Visit Number 5    Authorization - Number of Visits 16    OT Start Time 8144    OT Stop Time 8185    OT Time Calculation (min) 39 min    Activity Tolerance Patient tolerated treatment well    Behavior During Therapy [facility] 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   Hydrocephalus in newborn [facility])    Hypertension    Nausea 12/26/2014   PONV (postoperative nausea and vomiting)    Pregnant 12/26/2014   Sickle cell anemia ([place])    has trait   Spotting during pregnancy in first trimester 12/26/2014   Vaginal Pap smear, abnormal     Past Surgical History:  Procedure Laterality Date   BLADDER TUMOR EXCISION     CESAREAN SECTION     CESAREAN SECTION N/A 07/20/2015   Procedure: REPEAT CESAREAN SECTION;  Surgeon: Jonnie Kind, MD;  Location: [place] [facility];  Service: Obstetrics;  Laterality: N/A;   CSF SHUNT     MASS EXCISION N/A 07/04/2021   Procedure: EXCISION MASS; ABDOMEN;  Surgeon: Aviva Signs, MD;  Location: [facility];  Service: General;  Laterality: N/A;   SHOULDER ARTHROSCOPY WITH LABRAL REPAIR Right 10/02/2021   Procedure: SHOULDER ARTHROSCOPY WITH LABRAL  REPAIR, BICEPS TENOTOMY;  Surgeon: Mordecai Rasmussen, MD;  Location: [facility];  Service: Orthopedics;  Laterality: Right;    There were no vitals filed for this visit.   Subjective Assessment - 11/05/21 0820     Subjective  S: Putting this jacket on was hard.    Currently in Pain? Yes    Pain Score 3     Pain Location Shoulder    Pain Orientation Right    Pain Descriptors / Indicators Sore    Pain Type Acute pain    Pain Radiating Towards N/A    Pain Onset 1 to 4 weeks ago    Pain Frequency Intermittent    Aggravating Factors  putting jacket on    Pain Relieving Factors OTC pain medication    Effect of Pain on Daily Activities unable to use RUE for ADLs    Multiple Pain Sites No                [facility] OT Assessment - 11/05/21 0817       Assessment   Medical Diagnosis s/p right shoulder arthroscopy, superior labral repair and biceps tenotomy      Precautions   Precautions Shoulder    Type of Shoulder Precautions See protocol in media tab  OT Treatments/Exercises (OP) - 11/05/21 1660       Exercises   Exercises Shoulder      Shoulder Exercises: Supine   Protraction PROM;5 reps;AAROM;10 reps    Horizontal ABduction PROM;5 reps;AAROM;10 reps    External Rotation PROM;5 reps;AAROM;10 reps    Internal Rotation PROM;5 reps;AAROM;10 reps    Flexion PROM;5 reps;AAROM;10 reps    ABduction PROM;5 reps;AAROM;10 reps      Manual Therapy   Manual Therapy Myofascial release    Manual therapy comments Manual therapy completed prior to exercises    Myofascial Release Myofascial release and manual stretching completed to the right upper arm, uppe trapezius, and scapularis region to decrease fascial restrictions and increase joint mobility in a pain free zone.                      OT Short Term Goals - 10/24/21 0819       OT SHORT TERM GOAL #1   Title Pt will be provided with and educated on HEP to improve ability to perform ADL  tasks using RUE as dominant.    Time 4    Period Weeks    Status On-going    Target Date 11/21/21      OT SHORT TERM GOAL #2   Title Pt will increase RUE P/ROM to [facility] to improve ability to perform bathing and dressing tasks.    Time 4    Period Weeks    Status On-going      OT SHORT TERM GOAL #3   Title Pt will increase RUE strength to 3/5 to improve ability to reach items at waist to chest level.    Time 4    Period Weeks    Status On-going      OT SHORT TERM GOAL #4   Title Pt will decrease RUE fascial restrictions to min amounts to improve mobility required for functional reaching tasks.    Time 4    Period Weeks    Status On-going      OT SHORT TERM GOAL #5   Title Pt will increase RUE strength to 4/5 or greater to improve ability to lift objects at work and at home.    Time 4    Period Weeks    Status On-going               OT Long Term Goals - 10/24/21 0819       OT LONG TERM GOAL #1   Title Pt will decrease pain in RUE to 3/10 or less to improve ability to use RUE as dominant during ADL completion.    Time 8    Period Weeks    Status On-going    Target Date 12/21/21      OT LONG TERM GOAL #2   Title Pt will decrease RUE fascial restrictions to min amounts or less to improve mobility required for functional reaching tasks.    Time 8    Period Weeks    Status On-going      OT LONG TERM GOAL #3   Title Pt will increase RUE A/ROM to [facility] to improve ability to reach overhead and behind back when performing bathing and dressing tasks.    Time 8    Period Weeks    Status On-going      OT LONG TERM GOAL #4   Title Pt will increase RUE strength to 4+/5 or greater to improve ability to complete work tasks with small  children.    Time 8    Period Weeks    Status On-going                   Plan - 11/05/21 6015     Clinical Impression Statement A: Pt reports HEP is going well. Continued with myofascial release to address fascial restrictions.  Continued supine AA/ROM today, added thumb tacks and pulleys. Verbal cuing for form and technique. Reviewed HEP.    Body Structure / Function / Physical Skills ADL;Endurance;Muscle spasms;UE functional use;Fascial restriction;Pain;ROM;IADL;Strength    Plan P: continue with AA/ROM and progress to standing    OT Home Exercise Plan eval: table slides 1/23: AA/ROM    Consulted and Agree with Plan of Care Patient             Patient will benefit from skilled therapeutic intervention in order to improve the following deficits and impairments:   Body Structure / Function / Physical Skills: ADL, Endurance, Muscle spasms, UE functional use, Fascial restriction, Pain, ROM, IADL, Strength       Visit Diagnosis: Acute pain of right shoulder  Stiffness of right shoulder, not elsewhere classified  Other symptoms and signs involving the musculoskeletal system    Problem List Patient Active Problem List   Diagnosis Date Noted   Encounter for surveillance of contraceptive pills 10/01/2021   Routine general medical examination at a health care facility 10/01/2021   BV (bacterial vaginosis) 05/19/2021   Abnormal uterine bleeding (AUB) 05/19/2021   Keloid 05/19/2021   Encounter for menstrual regulation 05/19/2021   Dizzy spells 08/07/2020   Hypertension 08/07/2020   Encounter for screening fecal occult blood testing 08/07/2020   Encounter for well woman exam with routine gynecological exam 08/07/2020   Menorrhagia with irregular cycle 08/05/2018   Pregnancy examination or test, negative result 08/05/2018   Routine cervical smear 08/05/2018   Encounter for gynecological examination with Papanicolaou smear of cervix 08/05/2018   Abnormal skin growth 08/05/2018   History of DVT of lower extremity    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

## 2021-11-11 ENCOUNTER — Ambulatory Visit (HOSPITAL_COMMUNITY): Payer: Medicaid Other | Admitting: Occupational Therapy

## 2021-11-11 ENCOUNTER — Other Ambulatory Visit: Payer: Self-pay

## 2021-11-11 ENCOUNTER — Encounter (HOSPITAL_COMMUNITY): Payer: Self-pay | Admitting: Occupational Therapy

## 2021-11-11 DIAGNOSIS — M25611 Stiffness of right shoulder, not elsewhere classified: Secondary | ICD-10-CM

## 2021-11-11 DIAGNOSIS — R29898 Other symptoms and signs involving the musculoskeletal system: Secondary | ICD-10-CM

## 2021-11-11 DIAGNOSIS — M25511 Pain in right shoulder: Secondary | ICD-10-CM

## 2021-11-11 NOTE — Therapy (Signed)
[place], [place], [address] Phone: [phone]   Fax:  [phone]  Occupational Therapy Treatment  Patient Details  Name: Sarah Cross MRN: [id number] Date of Birth: 1980/12/19 Referring Provider (OT): Dr. Larena Glassman   Encounter Date: 11/11/2021   OT End of Session - 11/11/21 0855     Visit Number 7    Number of Visits 16    Date for OT Re-Evaluation 12/21/21   mini-reassessment 11/22/2021   Authorization Type HB Medicaid    Authorization Time Period 16 visits approved 1/13-3/12/23    Authorization - Visit Number 6    Authorization - Number of Visits 16    OT Start Time 4174    OT Stop Time 0814    OT Time Calculation (min) 39 min    Activity Tolerance Patient tolerated treatment well    Behavior During Therapy [facility] 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   Hydrocephalus in newborn [facility])    Hypertension    Nausea 12/26/2014   PONV (postoperative nausea and vomiting)    Pregnant 12/26/2014   Sickle cell anemia ([place])    has trait   Spotting during pregnancy in first trimester 12/26/2014   Vaginal Pap smear, abnormal     Past Surgical History:  Procedure Laterality Date   BLADDER TUMOR EXCISION     CESAREAN SECTION     CESAREAN SECTION N/A 07/20/2015   Procedure: REPEAT CESAREAN SECTION;  Surgeon: Jonnie Kind, MD;  Location: [facility];  Service: Obstetrics;  Laterality: N/A;   CSF SHUNT     MASS EXCISION N/A 07/04/2021   Procedure: EXCISION MASS; ABDOMEN;  Surgeon: Aviva Signs, MD;  Location: [facility];  Service: General;  Laterality: N/A;   SHOULDER ARTHROSCOPY WITH LABRAL REPAIR Right 10/02/2021   Procedure: SHOULDER ARTHROSCOPY WITH LABRAL  REPAIR, BICEPS TENOTOMY;  Surgeon: Mordecai Rasmussen, MD;  Location: [facility];  Service: Orthopedics;  Laterality: Right;    There were no vitals filed for this visit.   Subjective Assessment - 11/11/21 0815     Subjective  S: The exercises get a little painful sometimes.    Currently in Pain? Yes    Pain Score 2     Pain Location Shoulder    Pain Orientation Right    Pain Descriptors / Indicators Aching;Sore    Pain Type Acute pain    Pain Radiating Towards N/A    Pain Onset 1 to 4 weeks ago    Pain Frequency Intermittent    Aggravating Factors  certain movements    Pain Relieving Factors OTC pain medication    Effect of Pain on Daily Activities max effect on ADLs    Multiple Pain Sites No                [facility] OT Assessment - 11/11/21 0815       Assessment   Medical Diagnosis s/p right shoulder arthroscopy, superior labral repair and biceps tenotomy      Precautions   Precautions Shoulder    Type of Shoulder Precautions See protocol in media tab  OT Treatments/Exercises (OP) - 11/11/21 0819       Exercises   Exercises Shoulder      Shoulder Exercises: Supine   Protraction PROM;5 reps;AAROM;10 reps    Horizontal ABduction PROM;5 reps;AAROM;10 reps    External Rotation PROM;5 reps;AAROM;10 reps    Internal Rotation PROM;5 reps;AAROM;10 reps    Flexion PROM;5 reps;AAROM;10 reps    ABduction PROM;5 reps;AAROM;10 reps      Shoulder Exercises: Standing   Protraction AAROM;10 reps    Horizontal ABduction AAROM;10 reps    External Rotation AROM;10 reps    Internal Rotation AROM;10 reps    Flexion AAROM;10 reps    ABduction AAROM;10 reps      Shoulder Exercises: ROM/Strengthening   Wall Wash 1'    Proximal Shoulder Strengthening, Supine 10X each, no rest breaks      Manual Therapy   Manual Therapy Myofascial release    Manual therapy comments Manual therapy completed prior to exercises    Myofascial Release Myofascial release  and manual stretching completed to the right upper arm, uppe trapezius, and scapularis region to decrease fascial restrictions and increase joint mobility in a pain free zone.                      OT Short Term Goals - 10/24/21 0819       OT SHORT TERM GOAL #1   Title Pt will be provided with and educated on HEP to improve ability to perform ADL tasks using RUE as dominant.    Time 4    Period Weeks    Status On-going    Target Date 11/21/21      OT SHORT TERM GOAL #2   Title Pt will increase RUE P/ROM to [facility] to improve ability to perform bathing and dressing tasks.    Time 4    Period Weeks    Status On-going      OT SHORT TERM GOAL #3   Title Pt will increase RUE strength to 3/5 to improve ability to reach items at waist to chest level.    Time 4    Period Weeks    Status On-going      OT SHORT TERM GOAL #4   Title Pt will decrease RUE fascial restrictions to min amounts to improve mobility required for functional reaching tasks.    Time 4    Period Weeks    Status On-going      OT SHORT TERM GOAL #5   Title Pt will increase RUE strength to 4/5 or greater to improve ability to lift objects at work and at home.    Time 4    Period Weeks    Status On-going               OT Long Term Goals - 10/24/21 0819       OT LONG TERM GOAL #1   Title Pt will decrease pain in RUE to 3/10 or less to improve ability to use RUE as dominant during ADL completion.    Time 8    Period Weeks    Status On-going    Target Date 12/21/21      OT LONG TERM GOAL #2   Title Pt will decrease RUE fascial restrictions to min amounts or less to improve mobility required for functional reaching tasks.    Time 8    Period Weeks    Status On-going      OT LONG TERM GOAL #3  Title Pt will increase RUE A/ROM to [facility] to improve ability to reach overhead and behind back when performing bathing and dressing tasks.    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    Time 8    Period Weeks    Status On-going                   Plan - 11/11/21 2336     Clinical Impression Statement A: Pt reports she is completing her HEP, does have soreness at times. Continued with myofascial release to address fascial restrictions, AA/ROM in supine. Added proximal shoulder strengthening in supine and AA/ROM in standing today. Pt with ROM WFL during exercises. Verbal cuing for form and technique.    Body Structure / Function / Physical Skills ADL;Endurance;Muscle spasms;UE functional use;Fascial restriction;Pain;ROM;IADL;Strength    Plan P: add scapular theraband    OT Home Exercise Plan eval: table slides 1/23: AA/ROM    Consulted and Agree with Plan of Care Patient             Patient will benefit from skilled therapeutic intervention in order to improve the following deficits and impairments:   Body Structure / Function / Physical Skills: ADL, Endurance, Muscle spasms, UE functional use, Fascial restriction, Pain, ROM, IADL, Strength       Visit Diagnosis: Acute pain of right shoulder  Stiffness of right shoulder, not elsewhere classified  Other symptoms and signs involving the musculoskeletal system    Problem List Patient Active Problem List   Diagnosis Date Noted   Encounter for surveillance of contraceptive pills 10/01/2021   Routine general medical examination at a health care facility 10/01/2021   BV (bacterial vaginosis) 05/19/2021   Abnormal uterine bleeding (AUB) 05/19/2021   Keloid 05/19/2021   Encounter for menstrual regulation 05/19/2021   Dizzy spells 08/07/2020   Hypertension 08/07/2020   Encounter for screening fecal occult blood testing 08/07/2020   Encounter for well woman exam with routine gynecological exam 08/07/2020   Menorrhagia with irregular cycle 08/05/2018   Pregnancy examination  or test, negative result 08/05/2018   Routine cervical smear 08/05/2018   Encounter for gynecological examination with Papanicolaou smear of cervix 08/05/2018   Abnormal skin growth 08/05/2018   History of DVT of lower extremity    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

## 2021-11-13 ENCOUNTER — Ambulatory Visit (HOSPITAL_COMMUNITY): Payer: Medicaid Other | Attending: Orthopedic Surgery | Admitting: Occupational Therapy

## 2021-11-13 ENCOUNTER — Encounter (HOSPITAL_COMMUNITY): Payer: Self-pay | Admitting: Occupational Therapy

## 2021-11-13 ENCOUNTER — Other Ambulatory Visit: Payer: Self-pay

## 2021-11-13 DIAGNOSIS — R29898 Other symptoms and signs involving the musculoskeletal system: Secondary | ICD-10-CM | POA: Diagnosis present

## 2021-11-13 DIAGNOSIS — M25611 Stiffness of right shoulder, not elsewhere classified: Secondary | ICD-10-CM | POA: Insufficient documentation

## 2021-11-13 DIAGNOSIS — M25511 Pain in right shoulder: Secondary | ICD-10-CM | POA: Insufficient documentation

## 2021-11-13 NOTE — Therapy (Signed)
[place], [place], [address] Phone: [phone]   Fax:  [phone]  Occupational Therapy Treatment  Patient Details  Name: Sarah Cross MRN: [id number] Date of Birth: 02-22-1981 Referring Provider (OT): Dr. Larena Glassman   Encounter Date: 11/13/2021   OT End of Session - 11/13/21 1546     Visit Number 8    Number of Visits 16    Date for OT Re-Evaluation 12/21/21   mini-reassessment 11/22/2021   Authorization Type HB Medicaid    Authorization Time Period 16 visits approved 1/13-3/12/23    Authorization - Visit Number 7    Authorization - Number of Visits 16    OT Start Time 6387    OT Stop Time 1545    OT Time Calculation (min) 40 min    Activity Tolerance Patient tolerated treatment well    Behavior During Therapy [facility] 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   Hydrocephalus in newborn [facility])    Hypertension    Nausea 12/26/2014   PONV (postoperative nausea and vomiting)    Pregnant 12/26/2014   Sickle cell anemia ([place])    has trait   Spotting during pregnancy in first trimester 12/26/2014   Vaginal Pap smear, abnormal     Past Surgical History:  Procedure Laterality Date   BLADDER TUMOR EXCISION     CESAREAN SECTION     CESAREAN SECTION N/A 07/20/2015   Procedure: REPEAT CESAREAN SECTION;  Surgeon: Jonnie Kind, MD;  Location: [facility];  Service: Obstetrics;  Laterality: N/A;   CSF SHUNT     MASS EXCISION N/A 07/04/2021   Procedure: EXCISION MASS; ABDOMEN;  Surgeon: Aviva Signs, MD;  Location: [facility];  Service: General;  Laterality: N/A;   SHOULDER ARTHROSCOPY WITH LABRAL REPAIR Right 10/02/2021   Procedure: SHOULDER ARTHROSCOPY WITH LABRAL  REPAIR, BICEPS TENOTOMY;  Surgeon: Mordecai Rasmussen, MD;  Location: [facility];  Service: Orthopedics;  Laterality: Right;    There were no vitals filed for this visit.   Subjective Assessment - 11/13/21 1504     Subjective  S: It's sore today.    Currently in Pain? Yes    Pain Score 3     Pain Location Shoulder    Pain Orientation Right    Pain Descriptors / Indicators Aching;Sore    Pain Type Acute pain    Pain Radiating Towards N/A    Pain Onset 1 to 4 weeks ago    Pain Frequency Intermittent    Aggravating Factors  certain movements    Pain Relieving Factors OTC pain medication    Effect of Pain on Daily Activities Max effect on ADLs    Multiple Pain Sites No                [facility] OT Assessment - 11/13/21 1504       Assessment   Medical Diagnosis s/p right shoulder arthroscopy, superior labral repair and biceps tenotomy      Precautions   Precautions Shoulder    Type of Shoulder Precautions See protocol in media tab  OT Treatments/Exercises (OP) - 11/13/21 1507       Exercises   Exercises Shoulder      Shoulder Exercises: Supine   Protraction PROM;5 reps;AAROM;10 reps    Horizontal ABduction PROM;5 reps;AAROM;10 reps    External Rotation PROM;5 reps;AAROM;10 reps    Internal Rotation PROM;5 reps;AAROM;10 reps    Flexion PROM;5 reps;AAROM;10 reps    ABduction PROM;AAROM;5 reps      Shoulder Exercises: Standing   Protraction AAROM;10 reps    Horizontal ABduction AAROM;10 reps    External Rotation AROM;10 reps    Internal Rotation AROM;10 reps    Flexion AAROM;10 reps    ABduction AAROM;10 reps    Extension Theraband;10 reps    Theraband Level (Shoulder Extension) Level 2 (Red)    Row Theraband;10 reps    Theraband Level (Shoulder Row) Level 2 (Red)    Retraction Theraband;10 reps    Theraband Level (Shoulder Retraction) Level 2 (Red)      Manual Therapy   Manual Therapy Myofascial release    Manual therapy comments  Manual therapy completed prior to exercises    Myofascial Release Myofascial release and manual stretching completed to the right upper arm, uppe trapezius, and scapularis region to decrease fascial restrictions and increase joint mobility in a pain free zone.                      OT Short Term Goals - 10/24/21 0819       OT SHORT TERM GOAL #1   Title Pt will be provided with and educated on HEP to improve ability to perform ADL tasks using RUE as dominant.    Time 4    Period Weeks    Status On-going    Target Date 11/21/21      OT SHORT TERM GOAL #2   Title Pt will increase RUE P/ROM to [facility] to improve ability to perform bathing and dressing tasks.    Time 4    Period Weeks    Status On-going      OT SHORT TERM GOAL #3   Title Pt will increase RUE strength to 3/5 to improve ability to reach items at waist to chest level.    Time 4    Period Weeks    Status On-going      OT SHORT TERM GOAL #4   Title Pt will decrease RUE fascial restrictions to min amounts to improve mobility required for functional reaching tasks.    Time 4    Period Weeks    Status On-going      OT SHORT TERM GOAL #5   Title Pt will increase RUE strength to 4/5 or greater to improve ability to lift objects at work and at home.    Time 4    Period Weeks    Status On-going               OT Long Term Goals - 10/24/21 0819       OT LONG TERM GOAL #1   Title Pt will decrease pain in RUE to 3/10 or less to improve ability to use RUE as dominant during ADL completion.    Time 8    Period Weeks    Status On-going    Target Date 12/21/21      OT LONG TERM GOAL #2   Title Pt will decrease RUE fascial restrictions to min amounts or less to improve mobility required for functional reaching tasks.  Time 8    Period Weeks    Status On-going      OT LONG TERM GOAL #3   Title Pt will increase RUE A/ROM to [facility] to improve ability to reach overhead and behind back when performing bathing  and dressing tasks.    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    Time 8    Period Weeks    Status On-going                   Plan - 11/13/21 1526     Clinical Impression Statement A: Continued with myofascial release to address fascial restrictions, passive stretching. Pt continues to demonstrate ROM WFL during tasks. Pt with the most difficulty with abduction, completed more reps in standing as there was decreased pain in this position. Added scapular theraband today. Verbal and visual cuing for form and technique.    Body Structure / Function / Physical Skills ADL;Endurance;Muscle spasms;UE functional use;Fascial restriction;Pain;ROM;IADL;Strength    Plan P: Continue with AA/ROM increasing repetitions to 12, complete proximal shoulder strengthening supine and standing, add PVC pipe slide. Add scapular theraband to HEP    OT Home Exercise Plan eval: table slides 1/23: AA/ROM    Consulted and Agree with Plan of Care Patient             Patient will benefit from skilled therapeutic intervention in order to improve the following deficits and impairments:   Body Structure / Function / Physical Skills: ADL, Endurance, Muscle spasms, UE functional use, Fascial restriction, Pain, ROM, IADL, Strength       Visit Diagnosis: Acute pain of right shoulder  Stiffness of right shoulder, not elsewhere classified  Other symptoms and signs involving the musculoskeletal system    Problem List Patient Active Problem List   Diagnosis Date Noted   Encounter for surveillance of contraceptive pills 10/01/2021   Routine general medical examination at a health care facility 10/01/2021   BV (bacterial vaginosis) 05/19/2021   Abnormal uterine bleeding (AUB) 05/19/2021   Keloid 05/19/2021   Encounter for menstrual regulation 05/19/2021    Dizzy spells 08/07/2020   Hypertension 08/07/2020   Encounter for screening fecal occult blood testing 08/07/2020   Encounter for well woman exam with routine gynecological exam 08/07/2020   Menorrhagia with irregular cycle 08/05/2018   Pregnancy examination or test, negative result 08/05/2018   Routine cervical smear 08/05/2018   Encounter for gynecological examination with Papanicolaou smear of cervix 08/05/2018   Abnormal skin growth 08/05/2018   History of DVT of lower extremity    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

## 2021-11-18 ENCOUNTER — Encounter: Payer: Medicaid Other | Admitting: Orthopedic Surgery

## 2021-11-19 ENCOUNTER — Ambulatory Visit (HOSPITAL_COMMUNITY): Payer: Medicaid Other | Admitting: Occupational Therapy

## 2021-11-19 ENCOUNTER — Encounter (HOSPITAL_COMMUNITY): Payer: Self-pay | Admitting: Occupational Therapy

## 2021-11-19 ENCOUNTER — Other Ambulatory Visit: Payer: Self-pay

## 2021-11-19 DIAGNOSIS — M25611 Stiffness of right shoulder, not elsewhere classified: Secondary | ICD-10-CM

## 2021-11-19 DIAGNOSIS — R29898 Other symptoms and signs involving the musculoskeletal system: Secondary | ICD-10-CM

## 2021-11-19 DIAGNOSIS — M25511 Pain in right shoulder: Secondary | ICD-10-CM | POA: Diagnosis not present

## 2021-11-19 NOTE — Therapy (Signed)
[place], [place], [address] Phone: [phone]   Fax:  [phone]  Occupational Therapy Treatment  Patient Details  Name: Sarah Cross MRN: [id number] Date of Birth: [birth year] Referring Provider (OT): Dr. Larena Glassman   Encounter Date: 11/19/2021   OT End of Session - 11/19/21 0803     Visit Number 9    Number of Visits 16    Date for OT Re-Evaluation 12/21/21   mini-reassessment 11/22/2021   Authorization Type HB Medicaid    Authorization Time Period 16 visits approved 1/13-3/12/23    Authorization - Visit Number 8    Authorization - Number of Visits 16    OT Start Time 9242    OT Stop Time 0810    OT Time Calculation (min) 42 min    Activity Tolerance Patient tolerated treatment well    Behavior During Therapy [facility] 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   Hydrocephalus in newborn [facility])    Hypertension    Nausea 12/26/2014   PONV (postoperative nausea and vomiting)    Pregnant 12/26/2014   Sickle cell anemia ([place])    has trait   Spotting during pregnancy in first trimester 12/26/2014   Vaginal Pap smear, abnormal     Past Surgical History:  Procedure Laterality Date   BLADDER TUMOR EXCISION     CESAREAN SECTION     CESAREAN SECTION N/A 07/20/2015   Procedure: REPEAT CESAREAN SECTION;  Surgeon: Jonnie Kind, MD;  Location: [facility];  Service: Obstetrics;  Laterality: N/A;   CSF SHUNT     MASS EXCISION N/A 07/04/2021   Procedure: EXCISION MASS; ABDOMEN;  Surgeon: Aviva Signs, MD;  Location: [facility];  Service: General;  Laterality: N/A;   SHOULDER ARTHROSCOPY WITH LABRAL REPAIR Right 10/02/2021   Procedure: SHOULDER ARTHROSCOPY WITH LABRAL  REPAIR, BICEPS TENOTOMY;  Surgeon: Mordecai Rasmussen, MD;  Location: [facility];  Service: Orthopedics;  Laterality: Right;    There were no vitals filed for this visit.   Subjective Assessment - 11/19/21 0727     Subjective  S: It really hurts.    Currently in Pain? Yes    Pain Score 7     Pain Location Shoulder    Pain Orientation Right    Pain Descriptors / Indicators Aching;Sore    Pain Type Acute pain    Pain Radiating Towards N/A    Pain Onset More than a month ago    Pain Frequency Intermittent    Aggravating Factors  certain movements, work tasks    Pain Relieving Factors nothing    Effect of Pain on Daily Activities mod effect on ADLs    Multiple Pain Sites No                [facility] OT Assessment - 11/19/21 0727       Assessment   Medical Diagnosis s/p right shoulder arthroscopy, superior labral repair and biceps tenotomy      Precautions   Precautions Shoulder    Type of Shoulder Precautions See protocol in media tab  OT Treatments/Exercises (OP) - 11/19/21 0730       Exercises   Exercises Shoulder      Shoulder Exercises: Supine   Protraction PROM;5 reps;AAROM;12 reps    Horizontal ABduction PROM;5 reps;AROM;12 reps    External Rotation PROM;5 reps;AROM;12 reps    Internal Rotation PROM;5 reps;AAROM;12 reps    Flexion PROM;5 reps;AAROM;12 reps    ABduction PROM;5 reps;AAROM;10 reps      Shoulder Exercises: Seated   Protraction AAROM;10 reps    Horizontal ABduction AAROM;10 reps    External Rotation AROM;10 reps    Internal Rotation AROM;10 reps    Flexion AAROM;10 reps    Abduction AAROM;10 reps      Shoulder Exercises: ROM/Strengthening   Proximal Shoulder Strengthening, Supine 10X each, no rest breaks    Proximal Shoulder Strengthening, Seated 10X each, no rest breaks      Moist Heat Therapy   Number Minutes Moist Heat 10 Minutes    Moist Heat Location Shoulder      Manual Therapy   Manual Therapy Myofascial  release    Manual therapy comments Manual therapy completed prior to exercises    Myofascial Release Myofascial release and manual stretching completed to the right upper arm, uppe trapezius, and scapularis region to decrease fascial restrictions and increase joint mobility in a pain free zone.                      OT Short Term Goals - 10/24/21 0819       OT SHORT TERM GOAL #1   Title Pt will be provided with and educated on HEP to improve ability to perform ADL tasks using RUE as dominant.    Time 4    Period Weeks    Status On-going    Target Date 11/21/21      OT SHORT TERM GOAL #2   Title Pt will increase RUE P/ROM to [facility] to improve ability to perform bathing and dressing tasks.    Time 4    Period Weeks    Status On-going      OT SHORT TERM GOAL #3   Title Pt will increase RUE strength to 3/5 to improve ability to reach items at waist to chest level.    Time 4    Period Weeks    Status On-going      OT SHORT TERM GOAL #4   Title Pt will decrease RUE fascial restrictions to min amounts to improve mobility required for functional reaching tasks.    Time 4    Period Weeks    Status On-going      OT SHORT TERM GOAL #5   Title Pt will increase RUE strength to 4/5 or greater to improve ability to lift objects at work and at home.    Time 4    Period Weeks    Status On-going               OT Long Term Goals - 10/24/21 0819       OT LONG TERM GOAL #1   Title Pt will decrease pain in RUE to 3/10 or less to improve ability to use RUE as dominant during ADL completion.    Time 8    Period Weeks    Status On-going    Target Date 12/21/21      OT LONG TERM GOAL #2   Title Pt will decrease RUE fascial restrictions to min amounts or less to improve mobility required  for functional reaching tasks.    Time 8    Period Weeks    Status On-going      OT LONG TERM GOAL #3   Title Pt will increase RUE A/ROM to [facility] to improve ability to reach overhead and  behind back when performing bathing and dressing tasks.    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    Time 8    Period Weeks    Status On-going                   Plan - 11/19/21 0802     Clinical Impression Statement A: Pt reporting increased pain since beginning work this week. Continued with myofascial release to address fascial restrictions, passive stretching. Pt completing AA/ROM in supine and sitting, A/ROM for er and IR. Pt with max difficulty with abduction. Added proximal shoulder strengthening in sitting. Unable to complete ES due to being out of electrodes, therefore moist heat applied at end of session for pain management.    Body Structure / Function / Physical Skills ADL;Endurance;Muscle spasms;UE functional use;Fascial restriction;Pain;ROM;IADL;Strength    Plan P: Mini-reassessment, complete scapular theraband and add to HEP    OT Home Exercise Plan eval: table slides 1/23: AA/ROM    Consulted and Agree with Plan of Care Patient             Patient will benefit from skilled therapeutic intervention in order to improve the following deficits and impairments:   Body Structure / Function / Physical Skills: ADL, Endurance, Muscle spasms, UE functional use, Fascial restriction, Pain, ROM, IADL, Strength       Visit Diagnosis: Acute pain of right shoulder  Stiffness of right shoulder, not elsewhere classified  Other symptoms and signs involving the musculoskeletal system    Problem List Patient Active Problem List   Diagnosis Date Noted   Encounter for surveillance of contraceptive pills 10/01/2021   Routine general medical examination at a health care facility 10/01/2021   BV (bacterial vaginosis) 05/19/2021   Abnormal uterine bleeding (AUB) 05/19/2021   Keloid 05/19/2021   Encounter for menstrual  regulation 05/19/2021   Dizzy spells 08/07/2020   Hypertension 08/07/2020   Encounter for screening fecal occult blood testing 08/07/2020   Encounter for well woman exam with routine gynecological exam 08/07/2020   Menorrhagia with irregular cycle 08/05/2018   Pregnancy examination or test, negative result 08/05/2018   Routine cervical smear 08/05/2018   Encounter for gynecological examination with Papanicolaou smear of cervix 08/05/2018   Abnormal skin growth 08/05/2018   History of DVT of lower extremity    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 [place], [place], [address] Phone: [phone]   Fax:  [phone]  Name: Sarah Cross MRN: [id number] Date of Birth: 1981-02-03

## 2021-11-21 ENCOUNTER — Ambulatory Visit (HOSPITAL_COMMUNITY): Payer: Medicaid Other | Admitting: Occupational Therapy

## 2021-11-26 ENCOUNTER — Encounter (HOSPITAL_COMMUNITY): Payer: Self-pay | Admitting: Occupational Therapy

## 2021-11-26 ENCOUNTER — Ambulatory Visit (HOSPITAL_COMMUNITY): Payer: Medicaid Other | Admitting: Occupational Therapy

## 2021-11-26 ENCOUNTER — Other Ambulatory Visit: Payer: Self-pay

## 2021-11-26 DIAGNOSIS — M25511 Pain in right shoulder: Secondary | ICD-10-CM | POA: Diagnosis not present

## 2021-11-26 DIAGNOSIS — M25611 Stiffness of right shoulder, not elsewhere classified: Secondary | ICD-10-CM

## 2021-11-26 DIAGNOSIS — R29898 Other symptoms and signs involving the musculoskeletal system: Secondary | ICD-10-CM

## 2021-11-26 NOTE — Patient Instructions (Signed)

## 2021-11-26 NOTE — Therapy (Signed)
[place], [place], [address] Phone: [phone]   Fax:  [phone]  Occupational Therapy Treatment Mini-reassessment  Patient Details  Name: Sarah Cross MRN: [id number] Date of Birth: 04/27/1981 Referring Provider (OT): Dr. Larena Glassman   Encounter Date: 11/26/2021   OT End of Session - 11/26/21 0818     Visit Number 10    Number of Visits 16    Date for OT Re-Evaluation 12/21/21    Authorization Type HB Medicaid    Authorization Time Period 16 visits approved 1/13-3/12/23    Authorization - Visit Number 9    Authorization - Number of Visits 16    OT Start Time 5790    OT Stop Time 0810    OT Time Calculation (min) 41 min    Activity Tolerance Patient tolerated treatment well    Behavior During Therapy [facility] 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   Hydrocephalus in newborn [facility])    Hypertension    Nausea 12/26/2014   PONV (postoperative nausea and vomiting)    Pregnant 12/26/2014   Sickle cell anemia ([place])    has trait   Spotting during pregnancy in first trimester 12/26/2014   Vaginal Pap smear, abnormal     Past Surgical History:  Procedure Laterality Date   BLADDER TUMOR EXCISION     CESAREAN SECTION     CESAREAN SECTION N/A 07/20/2015   Procedure: REPEAT CESAREAN SECTION;  Surgeon: Jonnie Kind, MD;  Location: [facility];  Service: Obstetrics;  Laterality: N/A;   CSF SHUNT     MASS EXCISION N/A 07/04/2021   Procedure: EXCISION MASS; ABDOMEN;  Surgeon: Aviva Signs, MD;  Location: [facility];  Service: General;  Laterality: N/A;   SHOULDER ARTHROSCOPY WITH LABRAL REPAIR Right 10/02/2021   Procedure: SHOULDER ARTHROSCOPY WITH LABRAL REPAIR,  BICEPS TENOTOMY;  Surgeon: Mordecai Rasmussen, MD;  Location: [facility];  Service: Orthopedics;  Laterality: Right;    There were no vitals filed for this visit.   Subjective Assessment - 11/26/21 0731     Subjective  S: It feels heavy but I'm going to be ok.    Currently in Pain? Yes    Pain Score 2     Pain Location Shoulder    Pain Orientation Right    Pain Descriptors / Indicators Heaviness;Tightness    Pain Type Acute pain    Pain Radiating Towards N/A    Pain Onset More than a month ago    Pain Frequency Intermittent    Aggravating Factors  certain movements    Pain Relieving Factors rest    Effect of Pain on Daily Activities mod effect on ADLs    Multiple Pain Sites No                [facility] OT Assessment - 11/26/21 0732       Assessment   Medical Diagnosis s/p right shoulder arthroscopy, superior labral repair and biceps tenotomy      Precautions   Precautions Shoulder    Type of Shoulder Precautions See protocol in media tab      Palpation  Palpation comment min fascial restrictions along upper arm, trapezius, and scapular regions      AROM   Overall AROM Comments Assessed seated, er/IR adducted    AROM Assessment Site Shoulder    Right/Left Shoulder Right    Right Shoulder Flexion 136 Degrees   not previously assessed   Right Shoulder ABduction 168 Degrees   not previously assessed   Right Shoulder Internal Rotation 90 Degrees   not previously assessed   Right Shoulder External Rotation 85 Degrees   not previously assessed     PROM   Overall PROM Comments Assessed supine, er/IR abducted    PROM Assessment Site Shoulder    Right/Left Shoulder Right    Right Shoulder Flexion 140 Degrees   105 previous   Right Shoulder ABduction 180 Degrees   76 previous   Right Shoulder Internal Rotation 90 Degrees   same as previous   Right Shoulder External Rotation 80 Degrees   40 previous     Strength   Overall Strength Comments Assessed seated, er/IR adducted     Strength Assessment Site Shoulder    Right/Left Shoulder Right    Right Shoulder Flexion 4/5   not previously assessed   Right Shoulder ABduction 4-/5   not previously assessed   Right Shoulder Internal Rotation 4/5   not previously assessed   Right Shoulder External Rotation 4-/5   not previously assessed                     OT Treatments/Exercises (OP) - 11/26/21 0754       Exercises   Exercises Shoulder      Shoulder Exercises: Supine   Protraction PROM;5 reps    Horizontal ABduction PROM;5 reps    External Rotation PROM;5 reps    Internal Rotation PROM;5 reps    Flexion PROM;5 reps    ABduction PROM;5 reps      Shoulder Exercises: Standing   Protraction AROM;10 reps    Horizontal ABduction AROM;10 reps    External Rotation AROM;10 reps    Internal Rotation AROM;10 reps    Flexion AROM;10 reps    ABduction AROM;10 reps    Extension Theraband;10 reps    Theraband Level (Shoulder Extension) Level 2 (Red)    Row Theraband;10 reps    Theraband Level (Shoulder Row) Level 2 (Red)    Retraction Theraband;10 reps    Theraband Level (Shoulder Retraction) Level 2 (Red)      Manual Therapy   Manual Therapy Myofascial release    Manual therapy comments Manual therapy completed prior to exercises    Myofascial Release Myofascial release and manual stretching completed to the right upper arm, uppe trapezius, and scapularis region to decrease fascial restrictions and increase joint mobility in a pain free zone.                    OT Education - 11/26/21 0757     Education Details A/ROM exercise    Person(s) Educated Patient    Methods Explanation;Demonstration;Handout;Verbal cues    Comprehension Verbalized understanding;Returned demonstration              OT Short Term Goals - 11/26/21 0756       OT SHORT TERM GOAL #1   Title Pt will be provided with and educated on HEP to improve ability to perform ADL tasks using RUE as dominant.    Time 4     Period Weeks    Status Achieved  Target Date 11/21/21      OT SHORT TERM GOAL #2   Title Pt will increase RUE P/ROM to [facility] to improve ability to perform bathing and dressing tasks.    Time 4    Period Weeks    Status Achieved      OT SHORT TERM GOAL #3   Title Pt will increase RUE strength to 3/5 to improve ability to reach items at waist to chest level.    Time 4    Period Weeks    Status Achieved      OT SHORT TERM GOAL #4   Title Pt will decrease RUE fascial restrictions to min amounts to improve mobility required for functional reaching tasks.    Time 4    Period Weeks    Status Achieved      OT SHORT TERM GOAL #5   Title Pt will increase RUE strength to 4/5 or greater to improve ability to lift objects at work and at home.    Time 4    Period Weeks    Status Partially Met               OT Long Term Goals - 11/26/21 0757       OT LONG TERM GOAL #1   Title Pt will decrease pain in RUE to 3/10 or less to improve ability to use RUE as dominant during ADL completion.    Time 8    Period Weeks    Status On-going    Target Date 12/21/21      OT LONG TERM GOAL #2   Title Pt will decrease RUE fascial restrictions to min amounts or less to improve mobility required for functional reaching tasks.    Time 8    Period Weeks    Status Achieved      OT LONG TERM GOAL #3   Title Pt will increase RUE A/ROM to [facility] to improve ability to reach overhead and behind back when performing bathing and dressing tasks.    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    Time 8    Period Weeks    Status On-going                   Plan - 11/26/21 2706     Clinical Impression Statement A: Mini-reassessment completed this session. Pt is making great progress towards goals, has met 4/5 STGs and 2/4 LTGs with an additional  STG partially met. Pt is demonstrating ROM WFL and strength is improving. She continues to have decreased strength and activity tolerance, has pain more at night than during ADLs. Progressed to A/ROM and scapular theraband this session per protocol. Verbal cuing for form and technique.    Body Structure / Function / Physical Skills ADL;Endurance;Muscle spasms;UE functional use;Fascial restriction;Pain;ROM;IADL;Strength    Plan P: Follow up on HEP, continue with A/ROM, add proximal shoulder strengthening    OT Home Exercise Plan eval: table slides 1/23: AA/ROM; 2/15: A/ROM    Consulted and Agree with Plan of Care Patient             Patient will benefit from skilled therapeutic intervention in order to improve the following deficits and impairments:   Body Structure / Function / Physical Skills: ADL, Endurance,  Muscle spasms, UE functional use, Fascial restriction, Pain, ROM, IADL, Strength       Visit Diagnosis: Acute pain of right shoulder  Stiffness of right shoulder, not elsewhere classified  Other symptoms and signs involving the musculoskeletal system    Problem List Patient Active Problem List   Diagnosis Date Noted   Encounter for surveillance of contraceptive pills 10/01/2021   Routine general medical examination at a health care facility 10/01/2021   BV (bacterial vaginosis) 05/19/2021   Abnormal uterine bleeding (AUB) 05/19/2021   Keloid 05/19/2021   Encounter for menstrual regulation 05/19/2021   Dizzy spells 08/07/2020   Hypertension 08/07/2020   Encounter for screening fecal occult blood testing 08/07/2020   Encounter for well woman exam with routine gynecological exam 08/07/2020   Menorrhagia with irregular cycle 08/05/2018   Pregnancy examination or test, negative result 08/05/2018   Routine cervical smear 08/05/2018   Encounter for gynecological examination with Papanicolaou smear of cervix 08/05/2018   Abnormal skin growth 08/05/2018   History of DVT of  lower extremity    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

## 2021-11-28 ENCOUNTER — Telehealth (HOSPITAL_COMMUNITY): Payer: Self-pay | Admitting: Occupational Therapy

## 2021-11-28 ENCOUNTER — Encounter (HOSPITAL_COMMUNITY): Payer: Medicaid Other | Admitting: Occupational Therapy

## 2021-11-28 NOTE — Telephone Encounter (Signed)
Called pt regarding no-show for 7:30 today. Left voicemail reminding pt of next appt on 2/22 at 7:30. Requested pt call if unable to attend.    Guadelupe Sabin, OTR/L  [phone] 11/28/2021

## 2021-12-03 ENCOUNTER — Other Ambulatory Visit: Payer: Self-pay

## 2021-12-03 ENCOUNTER — Encounter (HOSPITAL_COMMUNITY): Payer: Self-pay | Admitting: Occupational Therapy

## 2021-12-03 ENCOUNTER — Ambulatory Visit (HOSPITAL_COMMUNITY): Payer: Medicaid Other | Admitting: Occupational Therapy

## 2021-12-03 DIAGNOSIS — M25511 Pain in right shoulder: Secondary | ICD-10-CM

## 2021-12-03 DIAGNOSIS — R29898 Other symptoms and signs involving the musculoskeletal system: Secondary | ICD-10-CM

## 2021-12-03 DIAGNOSIS — M25611 Stiffness of right shoulder, not elsewhere classified: Secondary | ICD-10-CM

## 2021-12-03 NOTE — Patient Instructions (Signed)

## 2021-12-03 NOTE — Therapy (Signed)
[place], [place], [address] Phone: [phone]   Fax:  [phone]  Occupational Therapy Treatment  Patient Details  Name: Sarah Cross MRN: [id number] Date of Birth: 06-21-1981 Referring Provider (OT): Dr. Larena Glassman   Encounter Date: 12/03/2021   OT End of Session - 12/03/21 0809     Visit Number 11    Number of Visits 16    Date for OT Re-Evaluation 12/21/21    Authorization Type HB Medicaid    Authorization Time Period 16 visits approved 1/13-3/12/23    Authorization - Visit Number 10    Authorization - Number of Visits 16    OT Start Time 6962    OT Stop Time 0812    OT Time Calculation (min) 44 min    Activity Tolerance Patient tolerated treatment well    Behavior During Therapy [facility] 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   Hydrocephalus in newborn [facility])    Hypertension    Nausea 12/26/2014   PONV (postoperative nausea and vomiting)    Pregnant 12/26/2014   Sickle cell anemia ([place])    has trait   Spotting during pregnancy in first trimester 12/26/2014   Vaginal Pap smear, abnormal     Past Surgical History:  Procedure Laterality Date   BLADDER TUMOR EXCISION     CESAREAN SECTION     CESAREAN SECTION N/A 07/20/2015   Procedure: REPEAT CESAREAN SECTION;  Surgeon: Jonnie Kind, MD;  Location: [facility];  Service: Obstetrics;  Laterality: N/A;   CSF SHUNT     MASS EXCISION N/A 07/04/2021   Procedure: EXCISION MASS; ABDOMEN;  Surgeon: Aviva Signs, MD;  Location: [facility];  Service: General;  Laterality: N/A;   SHOULDER ARTHROSCOPY WITH LABRAL REPAIR Right 10/02/2021   Procedure: SHOULDER ARTHROSCOPY WITH LABRAL REPAIR, BICEPS TENOTOMY;   Surgeon: Mordecai Rasmussen, MD;  Location: [facility];  Service: Orthopedics;  Laterality: Right;    There were no vitals filed for this visit.   Subjective Assessment - 12/03/21 0729     Subjective  S: It feels extremely heavy today.    Currently in Pain? Yes    Pain Score 4     Pain Location Shoulder    Pain Orientation Right    Pain Descriptors / Indicators Heaviness    Pain Type Acute pain    Pain Radiating Towards N/A    Pain Onset More than a month ago    Pain Frequency Intermittent    Aggravating Factors  certain movements    Pain Relieving Factors rest    Effect of Pain on Daily Activities mod effect on ADLs    Multiple Pain Sites No                [facility] OT Assessment - 12/03/21 0729       Assessment   Medical Diagnosis s/p right shoulder arthroscopy, superior labral repair and biceps tenotomy      Precautions   Precautions Shoulder    Type of Shoulder Precautions See protocol in media tab  OT Treatments/Exercises (OP) - 12/03/21 0730       Exercises   Exercises Shoulder      Shoulder Exercises: Supine   Protraction PROM;5 reps;AROM;15 reps    Horizontal ABduction PROM;5 reps;AROM;15 reps    External Rotation PROM;5 reps;AROM;15 reps    Internal Rotation PROM;5 reps;AROM;15 reps    Flexion PROM;5 reps;AROM;15 reps    ABduction PROM;5 reps;AROM;15 reps      Shoulder Exercises: Standing   Protraction AROM;12 reps    Horizontal ABduction AROM;12 reps    External Rotation AROM;12 reps    Internal Rotation AROM;12 reps    Flexion AROM;12 reps    ABduction AROM;12 reps    Extension Theraband;10 reps    Theraband Level (Shoulder Extension) Level 2 (Red)    Row Theraband;10 reps    Theraband Level (Shoulder Row) Level 2 (Red)    Retraction Theraband;10 reps    Theraband Level (Shoulder Retraction) Level 2 (Red)      Shoulder Exercises: ROM/Strengthening   X to V Arms 10X A/ROM    Proximal Shoulder Strengthening, Supine  10X each, no rest breaks    Proximal Shoulder Strengthening, Seated 10X each, no rest breaks    Other ROM/Strengthening Exercises proximal shoulder strengthening on doorway using washcloth, flexion, 1'      Manual Therapy   Manual Therapy Myofascial release    Manual therapy comments Manual therapy completed prior to exercises    Myofascial Release Myofascial release and manual stretching completed to the right upper arm, uppe trapezius, and scapularis region to decrease fascial restrictions and increase joint mobility in a pain free zone.                    OT Education - 12/03/21 0745     Education Details red scapular theraband    Person(s) Educated Patient    Methods Explanation;Demonstration;Handout;Verbal cues    Comprehension Verbalized understanding;Returned demonstration              OT Short Term Goals - 11/26/21 0756       OT SHORT TERM GOAL #1   Title Pt will be provided with and educated on HEP to improve ability to perform ADL tasks using RUE as dominant.    Time 4    Period Weeks    Status Achieved    Target Date 11/21/21      OT SHORT TERM GOAL #2   Title Pt will increase RUE P/ROM to [facility] to improve ability to perform bathing and dressing tasks.    Time 4    Period Weeks    Status Achieved      OT SHORT TERM GOAL #3   Title Pt will increase RUE strength to 3/5 to improve ability to reach items at waist to chest level.    Time 4    Period Weeks    Status Achieved      OT SHORT TERM GOAL #4   Title Pt will decrease RUE fascial restrictions to min amounts to improve mobility required for functional reaching tasks.    Time 4    Period Weeks    Status Achieved      OT SHORT TERM GOAL #5   Title Pt will increase RUE strength to 4/5 or greater to improve ability to lift objects at work and at home.    Time 4    Period Weeks    Status Partially Met  OT Long Term Goals - 11/26/21 0757       OT LONG TERM GOAL #1   Title  Pt will decrease pain in RUE to 3/10 or less to improve ability to use RUE as dominant during ADL completion.    Time 8    Period Weeks    Status On-going    Target Date 12/21/21      OT LONG TERM GOAL #2   Title Pt will decrease RUE fascial restrictions to min amounts or less to improve mobility required for functional reaching tasks.    Time 8    Period Weeks    Status Achieved      OT LONG TERM GOAL #3   Title Pt will increase RUE A/ROM to [facility] to improve ability to reach overhead and behind back when performing bathing and dressing tasks.    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    Time 8    Period Weeks    Status On-going                   Plan - 12/03/21 0744     Clinical Impression Statement A: Continued with myofascial release to address minimal fascial restrictions in anterior shoulder and trapezius regions. Pt with P/ROM [facility] today, continued with A/ROM, increasing repetitions to 15 in supine and 12 in standing. Added x to v arms and proximal shoulder strengthening on doorway. Verbal cuing for form and technique.    Body Structure / Function / Physical Skills ADL;Endurance;Muscle spasms;UE functional use;Fascial restriction;Pain;ROM;IADL;Strength    Plan P: Follow up on HEP, add overhead lacing and UBE    OT Home Exercise Plan eval: table slides 1/23: AA/ROM; 2/15: A/ROM; 2/22: red scapular theraband    Consulted and Agree with Plan of Care Patient             Patient will benefit from skilled therapeutic intervention in order to improve the following deficits and impairments:   Body Structure / Function / Physical Skills: ADL, Endurance, Muscle spasms, UE functional use, Fascial restriction, Pain, ROM, IADL, Strength       Visit Diagnosis: Acute pain of right shoulder  Stiffness of right shoulder, not  elsewhere classified  Other symptoms and signs involving the musculoskeletal system    Problem List Patient Active Problem List   Diagnosis Date Noted   Encounter for surveillance of contraceptive pills 10/01/2021   Routine general medical examination at a health care facility 10/01/2021   BV (bacterial vaginosis) 05/19/2021   Abnormal uterine bleeding (AUB) 05/19/2021   Keloid 05/19/2021   Encounter for menstrual regulation 05/19/2021   Dizzy spells 08/07/2020   Hypertension 08/07/2020   Encounter for screening fecal occult blood testing 08/07/2020   Encounter for well woman exam with routine gynecological exam 08/07/2020   Menorrhagia with irregular cycle 08/05/2018   Pregnancy examination or test, negative result 08/05/2018   Routine cervical smear 08/05/2018   Encounter for gynecological examination with Papanicolaou smear of cervix 08/05/2018   Abnormal skin growth 08/05/2018   History of DVT of lower extremity    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

## 2021-12-05 ENCOUNTER — Ambulatory Visit (HOSPITAL_COMMUNITY): Payer: Medicaid Other

## 2021-12-05 ENCOUNTER — Encounter (HOSPITAL_COMMUNITY): Payer: Self-pay

## 2021-12-05 ENCOUNTER — Other Ambulatory Visit: Payer: Self-pay

## 2021-12-05 DIAGNOSIS — R29898 Other symptoms and signs involving the musculoskeletal system: Secondary | ICD-10-CM

## 2021-12-05 DIAGNOSIS — M25511 Pain in right shoulder: Secondary | ICD-10-CM

## 2021-12-05 DIAGNOSIS — M25611 Stiffness of right shoulder, not elsewhere classified: Secondary | ICD-10-CM

## 2021-12-05 NOTE — Therapy (Signed)
[place], [place], [address] Phone: [phone]   Fax:  [phone]  Occupational Therapy Treatment  Patient Details  Name: Sarah Cross MRN: [id number] Date of Birth: 1981-09-04 Referring Provider (OT): Dr. Larena Glassman   Encounter Date: 12/05/2021   OT End of Session - 12/05/21 0838     Visit Number 12    Number of Visits 16    Date for OT Re-Evaluation 12/21/21    Authorization Type HB Medicaid    Authorization Time Period 16 visits approved 1/13-3/12/23    Authorization - Visit Number 11    Authorization - Number of Visits 71    OT Start Time 0815    OT Stop Time 6160    OT Time Calculation (min) 39 min    Activity Tolerance Patient tolerated treatment well    Behavior During Therapy [facility] 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   Hydrocephalus in newborn [facility])    Hypertension    Nausea 12/26/2014   PONV (postoperative nausea and vomiting)    Pregnant 12/26/2014   Sickle cell anemia ([place])    has trait   Spotting during pregnancy in first trimester 12/26/2014   Vaginal Pap smear, abnormal     Past Surgical History:  Procedure Laterality Date   BLADDER TUMOR EXCISION     CESAREAN SECTION     CESAREAN SECTION N/A 07/20/2015   Procedure: REPEAT CESAREAN SECTION;  Surgeon: Jonnie Kind, MD;  Location: [facility];  Service: Obstetrics;  Laterality: N/A;   CSF SHUNT     MASS EXCISION N/A 07/04/2021   Procedure: EXCISION MASS; ABDOMEN;  Surgeon: Aviva Signs, MD;  Location: [facility];  Service: General;  Laterality: N/A;   SHOULDER ARTHROSCOPY WITH LABRAL REPAIR Right 10/02/2021   Procedure: SHOULDER ARTHROSCOPY WITH LABRAL REPAIR, BICEPS TENOTOMY;   Surgeon: Mordecai Rasmussen, MD;  Location: [facility];  Service: Orthopedics;  Laterality: Right;    There were no vitals filed for this visit.   Subjective Assessment - 12/05/21 0824     Subjective  S: This arm will just feel heavy.    Currently in Pain? Yes    Pain Score 2     Pain Location Shoulder    Pain Orientation Right    Pain Descriptors / Indicators Sore    Pain Type Acute pain    Pain Onset More than a month ago    Pain Frequency Intermittent                [facility] OT Assessment - 12/05/21 0838       Assessment   Medical Diagnosis s/p right shoulder arthroscopy, superior labral repair and biceps tenotomy      Precautions   Precautions Shoulder    Type of Shoulder Precautions See protocol in media tab                      OT Treatments/Exercises (OP) - 12/05/21 0829       Exercises   Exercises Shoulder      Shoulder Exercises: Supine   Protraction Strengthening;12 reps  Protraction Weight (lbs) 1    Horizontal ABduction Strengthening;12 reps    Horizontal ABduction Weight (lbs) 1    External Rotation Strengthening;12 reps    External Rotation Weight (lbs) 1    Internal Rotation Strengthening;12 reps    Internal Rotation Weight (lbs) 1    Flexion AROM;20 reps    ABduction AROM;20 reps      Shoulder Exercises: Standing   Extension Theraband;20 reps    Theraband Level (Shoulder Extension) Level 2 (Red)    Row Theraband;20 reps    Theraband Level (Shoulder Row) Level 2 (Red)    Retraction Theraband;20 reps    Theraband Level (Shoulder Retraction) Level 3 (Green)      Shoulder Exercises: ROM/Strengthening   Over Head Lace seated. laced from top down then unlaced    Other ROM/Strengthening Exercises Basketball proximal shoulder strengthening. 10X chest press, flexion                      OT Short Term Goals - 12/05/21 0845       OT SHORT TERM GOAL #1   Title Pt will be provided with and educated on HEP to improve ability to  perform ADL tasks using RUE as dominant.    Time 4    Period Weeks    Target Date 11/21/21      OT SHORT TERM GOAL #2   Title Pt will increase RUE P/ROM to [facility] to improve ability to perform bathing and dressing tasks.    Time 4    Period Weeks      OT SHORT TERM GOAL #3   Title Pt will increase RUE strength to 3/5 to improve ability to reach items at waist to chest level.    Time 4    Period Weeks      OT SHORT TERM GOAL #4   Title Pt will decrease RUE fascial restrictions to min amounts to improve mobility required for functional reaching tasks.    Time 4    Period Weeks      OT SHORT TERM GOAL #5   Title Pt will increase RUE strength to 4/5 or greater to improve ability to lift objects at work and at home.    Time 4    Period Weeks    Status Partially Met               OT Long Term Goals - 12/05/21 0845       OT LONG TERM GOAL #1   Title Pt will decrease pain in RUE to 3/10 or less to improve ability to use RUE as dominant during ADL completion.    Time 8    Period Weeks    Status On-going    Target Date 12/21/21      OT LONG TERM GOAL #2   Title Pt will decrease RUE fascial restrictions to min amounts or less to improve mobility required for functional reaching tasks.    Time 8    Period Weeks      OT LONG TERM GOAL #3   Title Pt will increase RUE A/ROM to [facility] to improve ability to reach overhead and behind back when performing bathing and dressing tasks.    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    Time 8  Period Weeks    Status On-going                   Plan - 12/05/21 7673     Clinical Impression Statement A: A/ROM supine was completed with minimal difficulty. Completed 1# weight supine to progress strengthening slowly. Increased repetitions for scapular strengthening to 15X. VC for form and technique and  pace/speed. Added overhead lacing and UBE bike.    Body Structure / Function / Physical Skills ADL;Endurance;Muscle spasms;UE functional use;Fascial restriction;Pain;ROM;IADL;Strength    Plan P: Follow up on green band use at home. (not sure if I gave her too short of band. May need longer). Continue to slowly progress strengthening. Ball on the wall. Green theraband for scapular strengthening.    OT Home Exercise Plan eval: table slides 1/23: AA/ROM; 2/15: A/ROM; 2/22: red scapular theraband 2/24: provided green theraband to progress strengthening.    Consulted and Agree with Plan of Care Patient             Patient will benefit from skilled therapeutic intervention in order to improve the following deficits and impairments:   Body Structure / Function / Physical Skills: ADL, Endurance, Muscle spasms, UE functional use, Fascial restriction, Pain, ROM, IADL, Strength       Visit Diagnosis: Acute pain of right shoulder  Stiffness of right shoulder, not elsewhere classified  Other symptoms and signs involving the musculoskeletal system    Problem List Patient Active Problem List   Diagnosis Date Noted   Encounter for surveillance of contraceptive pills 10/01/2021   Routine general medical examination at a health care facility 10/01/2021   BV (bacterial vaginosis) 05/19/2021   Abnormal uterine bleeding (AUB) 05/19/2021   Keloid 05/19/2021   Encounter for menstrual regulation 05/19/2021   Dizzy spells 08/07/2020   Hypertension 08/07/2020   Encounter for screening fecal occult blood testing 08/07/2020   Encounter for well woman exam with routine gynecological exam 08/07/2020   Menorrhagia with irregular cycle 08/05/2018   Pregnancy examination or test, negative result 08/05/2018   Routine cervical smear 08/05/2018   Encounter for gynecological examination with Papanicolaou smear of cervix 08/05/2018   Abnormal skin growth 08/05/2018   History of DVT of lower extremity    VP  (ventriculoperitoneal) shunt status in place 07/12/2015   Ovarian cyst, left 01/28/2015   Gonorrhea affecting pregnancy in first trimester, antepartum 01/19/2015   Sickle cell trait ([place]) 01/19/2015   H/O cesarean section complicating pregnancy 41/93/7902   Nausea 12/26/2014   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

## 2021-12-10 ENCOUNTER — Encounter (HOSPITAL_COMMUNITY): Payer: Self-pay | Admitting: Occupational Therapy

## 2021-12-10 ENCOUNTER — Ambulatory Visit (HOSPITAL_COMMUNITY): Payer: Medicaid Other | Attending: Orthopedic Surgery | Admitting: Occupational Therapy

## 2021-12-10 ENCOUNTER — Other Ambulatory Visit: Payer: Self-pay

## 2021-12-10 DIAGNOSIS — M25611 Stiffness of right shoulder, not elsewhere classified: Secondary | ICD-10-CM | POA: Diagnosis present

## 2021-12-10 DIAGNOSIS — R29898 Other symptoms and signs involving the musculoskeletal system: Secondary | ICD-10-CM | POA: Insufficient documentation

## 2021-12-10 DIAGNOSIS — M25511 Pain in right shoulder: Secondary | ICD-10-CM | POA: Insufficient documentation

## 2021-12-10 NOTE — Therapy (Signed)
[facility] [address] [place], [place], [address] Phone: [phone]   Fax:  [phone]  Occupational Therapy Treatment  Patient Details  Name: Sarah Cross MRN: [id number] Date of Birth: August 22, 1981 Referring Provider (OT): Dr. Thane Edu   Encounter Date: 12/10/2021   OT End of Session - 12/10/21 0814     Visit Number 13    Number of Visits 16    Date for OT Re-Evaluation 12/21/21    Authorization Type HB Medicaid    Authorization Time Period 16 visits approved 1/13-3/12/23    Authorization - Visit Number 12    Authorization - Number of Visits 16    OT Start Time 0733    OT Stop Time 0813    OT Time Calculation (min) 40 min    Activity Tolerance Patient tolerated treatment well    Behavior During Therapy [facility] 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   Hydrocephalus in newborn [facility])    Hypertension    Nausea 12/26/2014   PONV (postoperative nausea and vomiting)    Pregnant 12/26/2014   Sickle cell anemia (HCC)    has trait   Spotting during pregnancy in first trimester 12/26/2014   Vaginal Pap smear, abnormal     Past Surgical History:  Procedure Laterality Date   BLADDER TUMOR EXCISION     CESAREAN SECTION     CESAREAN SECTION N/A 07/20/2015   Procedure: REPEAT CESAREAN SECTION;  Surgeon: Tilda Burrow, MD;  Location: [facility];  Service: Obstetrics;  Laterality: N/A;   CSF SHUNT     MASS EXCISION N/A 07/04/2021   Procedure: EXCISION MASS; ABDOMEN;  Surgeon: Franky Macho, MD;  Location: [facility];  Service: General;  Laterality: N/A;   SHOULDER ARTHROSCOPY WITH LABRAL REPAIR Right 10/02/2021   Procedure: SHOULDER ARTHROSCOPY WITH LABRAL REPAIR, BICEPS TENOTOMY;   Surgeon: Oliver Barre, MD;  Location: [facility];  Service: Orthopedics;  Laterality: Right;    There were no vitals filed for this visit.   Subjective Assessment - 12/10/21 0735     Subjective  S: It just feels heavy today.    Currently in Pain? No/denies                [facility] OT Assessment - 12/10/21 0735       Assessment   Medical Diagnosis s/p right shoulder arthroscopy, superior labral repair and biceps tenotomy      Precautions   Precautions Shoulder    Type of Shoulder Precautions See protocol in media tab                      OT Treatments/Exercises (OP) - 12/10/21 0735       Exercises   Exercises Shoulder      Shoulder Exercises: Supine   Protraction Strengthening;12 reps    Protraction Weight (lbs) 1    Horizontal ABduction Strengthening;12 reps    Horizontal ABduction Weight (lbs) 1    External Rotation Strengthening;12 reps    External Rotation Weight (lbs) 1    Internal Rotation Strengthening;12 reps    Internal Rotation Weight (lbs) 1  Flexion Strengthening;12 reps    Shoulder Flexion Weight (lbs) 1    ABduction Strengthening;12 reps    Shoulder ABduction Weight (lbs) 1      Shoulder Exercises: Standing   Protraction Strengthening;10 reps    Protraction Weight (lbs) 1    Horizontal ABduction Strengthening;10 reps    Horizontal ABduction Weight (lbs) 1    External Rotation Strengthening;10 reps    External Rotation Weight (lbs) 1    Internal Rotation Strengthening;10 reps    Internal Rotation Weight (lbs) 1    Flexion Strengthening;10 reps    Shoulder Flexion Weight (lbs) 1    ABduction Strengthening;10 reps    Shoulder ABduction Weight (lbs) 1      Shoulder Exercises: Therapy Ball   Other Therapy Ball Exercises green therapy ball: chest press, flexion, circles each direction.      Shoulder Exercises: ROM/Strengthening   UBE (Upper Arm Bike) Level 1 2' forward 2' reverse, pace: 5.0    X to V Arms 10X, 1#    Proximal Shoulder  Strengthening, Supine 10X, 1#, no rest breaks    Ball on Wall green ball: 1' flexion 1' abduction      Manual Therapy   Manual Therapy Myofascial release    Manual therapy comments Manual therapy completed prior to exercises    Myofascial Release Myofascial release and manual stretching completed to the right upper arm, uppe trapezius, and scapularis region to decrease fascial restrictions and increase joint mobility in a pain free zone.                      OT Short Term Goals - 12/05/21 0845       OT SHORT TERM GOAL #1   Title Pt will be provided with and educated on HEP to improve ability to perform ADL tasks using RUE as dominant.    Time 4    Period Weeks    Target Date 11/21/21      OT SHORT TERM GOAL #2   Title Pt will increase RUE P/ROM to [facility] to improve ability to perform bathing and dressing tasks.    Time 4    Period Weeks      OT SHORT TERM GOAL #3   Title Pt will increase RUE strength to 3/5 to improve ability to reach items at waist to chest level.    Time 4    Period Weeks      OT SHORT TERM GOAL #4   Title Pt will decrease RUE fascial restrictions to min amounts to improve mobility required for functional reaching tasks.    Time 4    Period Weeks      OT SHORT TERM GOAL #5   Title Pt will increase RUE strength to 4/5 or greater to improve ability to lift objects at work and at home.    Time 4    Period Weeks    Status Partially Met               OT Long Term Goals - 12/05/21 0845       OT LONG TERM GOAL #1   Title Pt will decrease pain in RUE to 3/10 or less to improve ability to use RUE as dominant during ADL completion.    Time 8    Period Weeks    Status On-going    Target Date 12/21/21      OT LONG TERM GOAL #2   Title Pt will decrease RUE fascial restrictions  to min amounts or less to improve mobility required for functional reaching tasks.    Time 8    Period Weeks      OT LONG TERM GOAL #3   Title Pt will increase RUE  A/ROM to [facility] to improve ability to reach overhead and behind back when performing bathing and dressing tasks.    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    Time 8    Period Weeks    Status On-going                   Plan - 12/10/21 0800     Clinical Impression Statement A: Myofascial release to min fascial restrictions in trapezius region. Continued with RUE strengthening, completed standing tasks using 1# weight. Added ball on wall and green therapy ball strengthening. Mod fatigue during session, verbal cuing for form and technique. Pt reports the green band was too short, OT provided longer band for home.    Body Structure / Function / Physical Skills ADL;Endurance;Muscle spasms;UE functional use;Fascial restriction;Pain;ROM;IADL;Strength    Plan P: Reassessment, discharge with HEP    OT Home Exercise Plan eval: table slides 1/23: AA/ROM; 2/15: A/ROM; 2/22: red scapular theraband 2/24: provided green theraband to progress strengthening.    Consulted and Agree with Plan of Care Patient             Patient will benefit from skilled therapeutic intervention in order to improve the following deficits and impairments:   Body Structure / Function / Physical Skills: ADL, Endurance, Muscle spasms, UE functional use, Fascial restriction, Pain, ROM, IADL, Strength       Visit Diagnosis: Acute pain of right shoulder  Stiffness of right shoulder, not elsewhere classified  Other symptoms and signs involving the musculoskeletal system    Problem List Patient Active Problem List   Diagnosis Date Noted   Encounter for surveillance of contraceptive pills 10/01/2021   Routine general medical examination at a health care facility 10/01/2021   BV (bacterial vaginosis) 05/19/2021   Abnormal uterine bleeding (AUB) 05/19/2021   Keloid 05/19/2021    Encounter for menstrual regulation 05/19/2021   Dizzy spells 08/07/2020   Hypertension 08/07/2020   Encounter for screening fecal occult blood testing 08/07/2020   Encounter for well woman exam with routine gynecological exam 08/07/2020   Menorrhagia with irregular cycle 08/05/2018   Pregnancy examination or test, negative result 08/05/2018   Routine cervical smear 08/05/2018   Encounter for gynecological examination with Papanicolaou smear of cervix 08/05/2018   Abnormal skin growth 08/05/2018   History of DVT of lower extremity    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

## 2021-12-12 ENCOUNTER — Ambulatory Visit (HOSPITAL_COMMUNITY): Payer: Medicaid Other | Admitting: Occupational Therapy

## 2021-12-12 ENCOUNTER — Encounter (HOSPITAL_COMMUNITY): Payer: Self-pay | Admitting: Occupational Therapy

## 2021-12-12 ENCOUNTER — Other Ambulatory Visit: Payer: Self-pay

## 2021-12-12 DIAGNOSIS — M25611 Stiffness of right shoulder, not elsewhere classified: Secondary | ICD-10-CM

## 2021-12-12 DIAGNOSIS — M25511 Pain in right shoulder: Secondary | ICD-10-CM

## 2021-12-12 DIAGNOSIS — R29898 Other symptoms and signs involving the musculoskeletal system: Secondary | ICD-10-CM

## 2021-12-12 NOTE — Patient Instructions (Signed)

## 2021-12-12 NOTE — Therapy (Signed)
[place], [place], [address] Phone: [phone]   Fax:  [phone]  Occupational Therapy Reassessment, Treatment, Discharge Summary  Patient Details  Name: Sarah Cross MRN: [id number] Date of Birth: 28-Jun-1981 Referring Provider (OT): Dr. Larena Glassman   Encounter Date: 12/12/2021   OT End of Session - 12/12/21 0806     Visit Number 14    Number of Visits 16    Date for OT Re-Evaluation 12/21/21    Authorization Type HB Medicaid    Authorization Time Period 16 visits approved 1/13-3/12/23    Authorization - Visit Number 13    Authorization - Number of Visits 16    OT Start Time 0731    OT Stop Time 0807    OT Time Calculation (min) 36 min    Activity Tolerance Patient tolerated treatment well    Behavior During Therapy [facility] 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   Hydrocephalus in newborn [facility])    Hypertension    Nausea 12/26/2014   PONV (postoperative nausea and vomiting)    Pregnant 12/26/2014   Sickle cell anemia ([place])    has trait   Spotting during pregnancy in first trimester 12/26/2014   Vaginal Pap smear, abnormal     Past Surgical History:  Procedure Laterality Date   BLADDER TUMOR EXCISION     CESAREAN SECTION     CESAREAN SECTION N/A 07/20/2015   Procedure: REPEAT CESAREAN SECTION;  Surgeon: Jonnie Kind, MD;  Location: [place] [facility];  Service: Obstetrics;  Laterality: N/A;   CSF SHUNT     MASS EXCISION N/A 07/04/2021   Procedure: EXCISION MASS; ABDOMEN;  Surgeon: Aviva Signs, MD;  Location: [facility];  Service: General;  Laterality: N/A;   SHOULDER ARTHROSCOPY WITH LABRAL REPAIR Right 10/02/2021   Procedure: SHOULDER ARTHROSCOPY WITH  LABRAL REPAIR, BICEPS TENOTOMY;  Surgeon: Mordecai Rasmussen, MD;  Location: [facility];  Service: Orthopedics;  Laterality: Right;    There were no vitals filed for this visit.   Subjective Assessment - 12/12/21 0733     Subjective  S: It's feeling great.    Currently in Pain? No/denies                [facility] OT Assessment - 12/12/21 0734       Assessment   Medical Diagnosis s/p right shoulder arthroscopy, superior labral repair and biceps tenotomy      Precautions   Precautions Shoulder    Type of Shoulder Precautions See protocol in media tab      Observation/Other Assessments   Quick DASH  6.82   72.73 previous     Palpation   Palpation comment min fascial restrictions along upper trapezius      AROM   Overall AROM Comments Assessed seated, er/IR adducted    AROM Assessment Site Shoulder    Right/Left Shoulder Right    Right Shoulder Flexion 155 Degrees   136 previous   Right Shoulder ABduction 180 Degrees   168 previous   Right Shoulder Internal Rotation 90 Degrees   same as previous   Right Shoulder External Rotation 90 Degrees  85 previous     PROM   Overall PROM Comments Assessed supine, er/IR abducted    PROM Assessment Site Shoulder    Right/Left Shoulder Right    Right Shoulder Flexion 155 Degrees   140 previous   Right Shoulder ABduction 180 Degrees   same as previous   Right Shoulder Internal Rotation 90 Degrees   same as previous   Right Shoulder External Rotation 90 Degrees   80 previous     Strength   Overall Strength Comments Assessed seated, er/IR adducted    Strength Assessment Site Shoulder    Right/Left Shoulder Right    Right Shoulder Flexion 4+/5   4/5 previous   Right Shoulder ABduction 4+/5   4-/5 previous   Right Shoulder Internal Rotation 5/5   4/5 previous   Right Shoulder External Rotation 4+/5   4-/5 previous               Katina Dung - 12/12/21 0746     Open a tight or new jar No difficulty    Do heavy household chores (wash  walls, wash floors) No difficulty    Carry a shopping bag or briefcase No difficulty    Wash your back No difficulty    Use a knife to cut food No difficulty    Recreational activities in which you take some force or impact through your arm, shoulder, or hand (golf, hammering, tennis) Mild difficulty    During the past week, to what extent has your arm, shoulder or hand problem interfered with your normal social activities with family, friends, neighbors, or groups? Not at all    During the past week, to what extent has your arm, shoulder or hand problem limited your work or other regular daily activities Not at all    Arm, shoulder, or hand pain. Mild    Tingling (pins and needles) in your arm, shoulder, or hand None    Difficulty Sleeping Mild difficulty    DASH Score 6.82 %                  OT Treatments/Exercises (OP) - 12/12/21 0735       Exercises   Exercises Shoulder      Shoulder Exercises: Supine   Protraction PROM;5 reps    Horizontal ABduction PROM;5 reps    External Rotation PROM;5 reps    Internal Rotation PROM;5 reps    Flexion PROM;5 reps    ABduction PROM;5 reps      Shoulder Exercises: Standing   Protraction Theraband;10 reps    Theraband Level (Shoulder Protraction) Level 3 (Green)    Horizontal ABduction Theraband;10 reps    Theraband Level (Shoulder Horizontal ABduction) Level 3 (Green)    External Rotation Theraband;10 reps    Theraband Level (Shoulder External Rotation) Level 3 (Green)    Internal Rotation Theraband;10 reps    Theraband Level (Shoulder Internal Rotation) Level 3 (Green)    Flexion Theraband;10 reps    Theraband Level (Shoulder Flexion) Level 3 (Green)    ABduction Theraband;10 reps    Theraband Level (Shoulder ABduction) Level 3 Nyoka Cowden)                    OT Education - 12/12/21 0749     Education Details green theraband strengthening exercises    Person(s) Educated Patient    Methods  Explanation;Demonstration;Handout;Verbal cues    Comprehension Verbalized understanding;Returned demonstration  OT Short Term Goals - 12/12/21 0748       OT SHORT TERM GOAL #1   Title Pt will be provided with and educated on HEP to improve ability to perform ADL tasks using RUE as dominant.    Time 4    Period Weeks    Status Achieved    Target Date 11/21/21      OT SHORT TERM GOAL #2   Title Pt will increase RUE P/ROM to [facility] to improve ability to perform bathing and dressing tasks.    Time 4    Period Weeks      OT SHORT TERM GOAL #3   Title Pt will increase RUE strength to 3/5 to improve ability to reach items at waist to chest level.    Time 4    Period Weeks      OT SHORT TERM GOAL #4   Title Pt will decrease RUE fascial restrictions to min amounts to improve mobility required for functional reaching tasks.    Time 4    Period Weeks      OT SHORT TERM GOAL #5   Title Pt will increase RUE strength to 4/5 or greater to improve ability to lift objects at work and at home.    Time 4    Period Weeks    Status Achieved               OT Long Term Goals - 12/12/21 0748       OT LONG TERM GOAL #1   Title Pt will decrease pain in RUE to 3/10 or less to improve ability to use RUE as dominant during ADL completion.    Time 8    Period Weeks    Status Achieved    Target Date 12/21/21      OT LONG TERM GOAL #2   Title Pt will decrease RUE fascial restrictions to min amounts or less to improve mobility required for functional reaching tasks.    Time 8    Period Weeks    Status Achieved      OT LONG TERM GOAL #3   Title Pt will increase RUE A/ROM to [facility] to improve ability to reach overhead and behind back when performing bathing and dressing tasks.    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    Time 8     Period Weeks    Status Achieved                   Plan - 12/12/21 5038     Clinical Impression Statement A: Reassessment completed this session, pt has met all goals and reports she is completing all ADLs and work tasks using RUE as dominant with only occasional soreness. Pt is now able to sleep comfortably without waking due to pain. Pt has made great progress with RUE pain, ROM, and strength, demonstrates ROM and strength WFL. Pt completing green theraband strengthening today, verbal cuing for form and technique. Pt is agreeable with discharge today.    Body Structure / Function / Physical Skills ADL;Endurance;Muscle spasms;UE functional use;Fascial restriction;Pain;ROM;IADL;Strength    Plan P: Discharge pt    OT Home Exercise Plan eval: table slides 1/23: AA/ROM; 2/15: A/ROM; 2/22: red scapular theraband 2/24: provided green theraband to  progress strengthening; 3/3: green theraband strengthening    Consulted and Agree with Plan of Care Patient             Patient will benefit from skilled therapeutic intervention in order to improve the following deficits and impairments:   Body Structure / Function / Physical Skills: ADL, Endurance, Muscle spasms, UE functional use, Fascial restriction, Pain, ROM, IADL, Strength       Visit Diagnosis: Acute pain of right shoulder  Stiffness of right shoulder, not elsewhere classified  Other symptoms and signs involving the musculoskeletal system    Problem List Patient Active Problem List   Diagnosis Date Noted   Encounter for surveillance of contraceptive pills 10/01/2021   Routine general medical examination at a health care facility 10/01/2021   BV (bacterial vaginosis) 05/19/2021   Abnormal uterine bleeding (AUB) 05/19/2021   Keloid 05/19/2021   Encounter for menstrual regulation 05/19/2021   Dizzy spells 08/07/2020   Hypertension 08/07/2020   Encounter for screening fecal occult blood testing 08/07/2020   Encounter  for well woman exam with routine gynecological exam 08/07/2020   Menorrhagia with irregular cycle 08/05/2018   Pregnancy examination or test, negative result 08/05/2018   Routine cervical smear 08/05/2018   Encounter for gynecological examination with Papanicolaou smear of cervix 08/05/2018   Abnormal skin growth 08/05/2018   History of DVT of lower extremity    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 07/12/2015   Ovarian cyst, left 01/28/2015   Gonorrhea affecting pregnancy in first trimester, antepartum 01/19/2015   Sickle cell trait ([place]) 01/19/2015   H/O cesarean section complicating pregnancy 29/52/8413   Nausea 12/26/2014   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 Pt has met all goals, is demonstrating ROM and strength WFL.    Remaining deficits: Decreased activity tolerance   Education / Equipment: HEP for green theraband strengthening   Patient agrees to discharge. Patient goals were met. Patient is being discharged due to meeting the stated rehab goals.Marland Kitchen

## 2021-12-16 ENCOUNTER — Ambulatory Visit (INDEPENDENT_AMBULATORY_CARE_PROVIDER_SITE_OTHER): Payer: Medicaid Other | Admitting: Orthopedic Surgery

## 2021-12-16 ENCOUNTER — Other Ambulatory Visit: Payer: Self-pay

## 2021-12-16 ENCOUNTER — Encounter: Payer: Self-pay | Admitting: Orthopedic Surgery

## 2021-12-16 VITALS — Ht 61.0 in | Wt 161.0 lb

## 2021-12-16 DIAGNOSIS — S43431D Superior glenoid labrum lesion of right shoulder, subsequent encounter: Secondary | ICD-10-CM

## 2021-12-16 NOTE — Progress Notes (Signed)
Orthopaedic Postop Note ? ?Assessment: ?Sarah Cross is a 41 y.o. female s/p right shoulder arthroscopy, superior labral repair and biceps tenotomy ? ?DOS: 10/02/2021 ? ?Plan: ?Patient is doing very well overall.  She has occasional pain in her right shoulder.  She can now reach overhead.  She is very pleased.  She has no restrictions.  I recommend she continue to work on exercises, to improve her strength and endurance.  No follow-up is needed at this time.  She should contact the clinic if she has any issues. ? ? ?Follow-up: ?Return if symptoms worsen or fail to improve. ?XR at next visit: None ? ?Subjective: ? ?Chief Complaint  ?Patient presents with  ? Routine Post Op  ?  Rt shoulder DOS 10/02/21  ? ? ?History of Present Illness: ?Sarah Cross is a 41 y.o. female who presents following the above stated procedure.  Right shoulder arthroscopy was completed approximately 10-11 weeks ago.  She is now doing very well.  She is not taking pain medications.  She can reach her arm overhead.  She has no problems with her daily activities.  She is no longer working with physical therapy, but continues to do the exercises at home.  She states she has occasional pain, and some "heaviness" in her right arm. ? ? ?Review of Systems: ?No fevers or chills ?No numbness or tingling ?No Chest Pain ?No shortness of breath ? ? ?Objective: ?Ht '5\' 1"'$  (1.549 m)   Wt 161 lb (73 kg)   BMI 30.42 kg/m?  ? ?Physical Exam: ? ?Alert and oriented.  ? ?Right shoulder surgical incisions are healing well.  No surrounding erythema or drainage.  No tenderness in line with the incisions.  She has 160 degrees of forward flexion.  Internal rotation to T12.  Supraspinatus strength testing is 4+/5.  No pain on range of motion testing.  Fingers are warm and well-perfused.  Sensation is intact throughout the right hand.  2+ radial pulse. ? ? ? ?IMAGING: ?I personally ordered and reviewed the following images: ? ?No new imaging obtained  today. ? ? ?Mordecai Rasmussen, MD ?12/16/2021 ?9:13 AM ? ? ?

## 2022-02-13 ENCOUNTER — Telehealth: Payer: Self-pay | Admitting: Family Medicine

## 2022-02-13 NOTE — Telephone Encounter (Signed)
Patient call stating that she had surgery 06/2021 and she has been having some pain in that area along with some areas of swelling. She states that at times the pain is so bad that she has trouble walking. This has only occurred once or twice. ? ?Informed pt that we do not have a MD in office today or Monday for her to call and make an apt with her PCP as this does not sound like something from her surgery. If her PCP thinks it is something surgical that needs to be seen we will be more then happy to see her.  ? ?Patient verbalizes understanding. ?

## 2022-02-19 ENCOUNTER — Other Ambulatory Visit: Payer: Self-pay

## 2022-02-19 ENCOUNTER — Encounter (HOSPITAL_COMMUNITY): Payer: Self-pay | Admitting: *Deleted

## 2022-02-19 ENCOUNTER — Emergency Department (HOSPITAL_COMMUNITY)
Admission: EM | Admit: 2022-02-19 | Discharge: 2022-02-19 | Disposition: A | Discharge status: Home / Self Care | Payer: Medicaid Other | Attending: Emergency Medicine | Admitting: Emergency Medicine

## 2022-02-19 ENCOUNTER — Emergency Department (HOSPITAL_COMMUNITY): Payer: Medicaid Other

## 2022-02-19 DIAGNOSIS — R710 Precipitous drop in hematocrit: Secondary | ICD-10-CM | POA: Diagnosis not present

## 2022-02-19 DIAGNOSIS — R319 Hematuria, unspecified: Secondary | ICD-10-CM | POA: Insufficient documentation

## 2022-02-19 DIAGNOSIS — N9489 Other specified conditions associated with female genital organs and menstrual cycle: Secondary | ICD-10-CM | POA: Diagnosis not present

## 2022-02-19 DIAGNOSIS — K429 Umbilical hernia without obstruction or gangrene: Secondary | ICD-10-CM | POA: Insufficient documentation

## 2022-02-19 DIAGNOSIS — R1033 Periumbilical pain: Secondary | ICD-10-CM | POA: Diagnosis present

## 2022-02-19 LAB — POC URINE PREG, ED: Preg Test, Ur: NEGATIVE

## 2022-02-19 LAB — COMPREHENSIVE METABOLIC PANEL
ALT: 15 U/L (ref 0–44)
AST: 20 U/L (ref 15–41)
Albumin: 4.4 g/dL (ref 3.5–5.0)
Alkaline Phosphatase: 71 U/L (ref 38–126)
Anion gap: 7 (ref 5–15)
BUN: 9 mg/dL (ref 6–20)
CO2: 22 mmol/L (ref 22–32)
Calcium: 8.9 mg/dL (ref 8.9–10.3)
Chloride: 109 mmol/L (ref 98–111)
Creatinine, Ser: 0.85 mg/dL (ref 0.44–1.00)
GFR, Estimated: >60 mL/min (ref >60)
Glucose, Bld: 109 mg/dL — ABNORMAL HIGH (ref 70–99)
Potassium: 3.3 mmol/L — ABNORMAL LOW (ref 3.5–5.1)
Sodium: 138 mmol/L (ref 135–145)
Total Bilirubin: 0.9 mg/dL (ref 0.3–1.2)
Total Protein: 8.2 g/dL — ABNORMAL HIGH (ref 6.5–8.1)

## 2022-02-19 LAB — URINALYSIS, ROUTINE W REFLEX MICROSCOPIC
Bilirubin Urine: NEGATIVE
Glucose, UA: NEGATIVE mg/dL
Ketones, ur: NEGATIVE mg/dL
Leukocytes,Ua: NEGATIVE
Nitrite: NEGATIVE
Protein, ur: NEGATIVE mg/dL
RBC / HPF: >50 RBC/hpf — ABNORMAL HIGH (ref 0–5)
Specific Gravity, Urine: 1.009 (ref 1.005–1.030)
pH: 7 (ref 5.0–8.0)

## 2022-02-19 LAB — CBC
HCT: 35.6 % — ABNORMAL LOW (ref 36.0–46.0)
Hemoglobin: 11.5 g/dL — ABNORMAL LOW (ref 12.0–15.0)
MCH: 23.9 pg — ABNORMAL LOW (ref 26.0–34.0)
MCHC: 32.3 g/dL (ref 30.0–36.0)
MCV: 73.9 fL — ABNORMAL LOW (ref 80.0–100.0)
Platelets: 368 10*3/uL (ref 150–400)
RBC: 4.82 MIL/uL (ref 3.87–5.11)
RDW: 16.8 % — ABNORMAL HIGH (ref 11.5–15.5)
WBC: 7.1 10*3/uL (ref 4.0–10.5)
nRBC: 0 % (ref 0.0–0.2)

## 2022-02-19 LAB — HCG, SERUM, QUALITATIVE: Preg, Serum: NEGATIVE

## 2022-02-19 LAB — LIPASE, BLOOD: Lipase: 25 U/L (ref 11–51)

## 2022-02-19 MED ORDER — FENTANYL CITRATE PF 50 MCG/ML IJ SOSY
50.0000 ug | PREFILLED_SYRINGE | INTRAMUSCULAR | Status: DC | PRN
  Administered 2022-02-19: 50 ug via INTRAVENOUS
  Filled 2022-02-19: qty 1

## 2022-02-19 MED ORDER — ONDANSETRON HCL 4 MG/2ML IJ SOLN
4.0000 mg | Freq: Once | INTRAMUSCULAR | Status: AC | PRN
  Administered 2022-02-19: 4 mg via INTRAVENOUS
  Filled 2022-02-19: qty 2

## 2022-02-19 MED ORDER — TRAMADOL HCL 50 MG PO TABS
50.0000 mg | ORAL_TABLET | Freq: Four times a day (QID) | ORAL | 0 refills | Status: DC | PRN
Start: 2022-02-19 — End: 2022-03-05

## 2022-02-19 MED ORDER — CEPHALEXIN 500 MG PO CAPS: 500.0000 mg | ORAL_CAPSULE | Freq: Four times a day (QID) | ORAL | 0 refills | Status: DC

## 2022-02-19 NOTE — ED Triage Notes (Signed)
Pt c/o generalized abdominal pain for the last week that has gotten progressively worse with pain radiating to her back with the pain more on right side; pt denies any n/v/d or urinary sx ?

## 2022-02-19 NOTE — Telephone Encounter (Signed)
Received call from patient [phone]~ telephone.  ? ?Reports that she had keloid scar tissue removed from umbilicus in 74/1638. States that she has been having pain and swelling in the incision scar tissue recently.  ? ?Patient reports that she scheduled appointment with PCP, but has not been able to be seen at this time.  Patient states that pain in abdomen worsened and she was seen in ED.  ? ?Noted to have hematuria and umbilical hernia at ED. Appointment scheduled with Dr. Arnoldo Morale.  ?

## 2022-02-19 NOTE — ED Provider Notes (Signed)
?[place] ?Provider Note ? ? ?CSN: [id number] ?Arrival date & time: 02/19/22  0818 ? ?  ? ?History ? ?Chief Complaint  ?Patient presents with  ? Abdominal Pain  ? ? ?Sarah Cross is a 41 y.o. female. ? ?Patient with a complaint of abdominal pain for about a week and a half kind of periumbilical area.  Patient states it got worse today.  And she was unable to get out of the car.  No nausea no vomiting.  No diarrhea.  Patient currently on her menstrual period. ? 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  Has not had any recent revisions.  Has not had any complaint of headache.  Patient's had past C-sections and in September 2022 Dr. Arnoldo Morale removed a keloid mass from the abdomen that was around the umbilical area.  In addition past medical history significant endometriosis sickle cell trait history of DVT of lower extremity in 2008 after bladder surgery hypertension gastroesophageal reflux disease.  And reportedly had a bladder tumor excision.  Patient never smoked.  Patient with recent shoulder surgery in December 22 with right labral repair. ? ? ?  ? ?Home Medications ?Prior to Admission medications   ?Medication Sig Start Date End Date Taking? Authorizing Provider  ?cephALEXin (KEFLEX) 500 MG capsule Take 1 capsule (500 mg total) by mouth 4 (four) times daily. 02/19/22  Yes Fredia Sorrow, MD  ?traMADol (ULTRAM) 50 MG tablet Take 1 tablet (50 mg total) by mouth every 6 (six) hours as needed. 02/19/22  Yes Fredia Sorrow, MD  ?losartan (COZAAR) 25 MG tablet TAKE 1 TABLET(25 MG) BY MOUTH DAILY ?Patient not taking: Reported on 02/19/2022 10/01/21   Estill Dooms, NP  ?metroNIDAZOLE (FLAGYL) 500 MG tablet Take 1 tablet (500 mg total) by mouth 2 (two) times daily. ?Patient not taking: Reported on 02/19/2022 10/09/21   Estill Dooms, NP  ?norethindrone (MICRONOR) 0.35 MG tablet Take 1 tablet (0.35 mg total) by mouth daily. ?Patient not  taking: Reported on 02/19/2022 05/19/21   Estill Dooms, NP  ?   ? ?Allergies    ?Ivp dye [iodinated contrast media] and Latex   ? ?Review of Systems   ?Review of Systems  ?Constitutional:  Negative for chills and fever.  ?HENT:  Negative for congestion, ear pain, rhinorrhea and sore throat.   ?Eyes:  Negative for pain and visual disturbance.  ?Respiratory:  Negative for cough and shortness of breath.   ?Cardiovascular:  Negative for chest pain, palpitations and leg swelling.  ?Gastrointestinal:  Positive for abdominal pain. Negative for abdominal distention, diarrhea, nausea and vomiting.  ?Genitourinary:  Positive for vaginal bleeding. Negative for dysuria and hematuria.  ?Musculoskeletal:  Negative for arthralgias, back pain and neck pain.  ?Skin:  Negative for color change and rash.  ?Neurological:  Negative for dizziness, seizures, syncope, light-headedness and headaches.  ?Hematological:  Does not bruise/bleed easily.  ?Psychiatric/Behavioral:  Negative for confusion.   ?All other systems reviewed and are negative. ? ?Physical Exam ?Updated Vital Signs ?BP (!) 140/96   Pulse (!) 59   Temp 98 ?F (36.7 ?C)   Resp 18   Ht 1.575 m ('5\' 2"'$ )   Wt 72.6 kg   LMP 02/17/2022   SpO2 99%   BMI 29.26 kg/m?  ?Physical Exam ?Vitals and nursing note reviewed.  ?Constitutional:   ?   General: She is not in acute distress. ?   Appearance: She is well-developed. She is not  toxic-appearing.  ?HENT:  ?   Head: Normocephalic and atraumatic.  ?Eyes:  ?   Conjunctiva/sclera: Conjunctivae normal.  ?Cardiovascular:  ?   Rate and Rhythm: Normal rate and regular rhythm.  ?   Heart sounds: No murmur heard. ?Pulmonary:  ?   Effort: Pulmonary effort is normal. No respiratory distress.  ?   Breath sounds: Normal breath sounds.  ?Abdominal:  ?   Palpations: Abdomen is soft.  ?   Tenderness: There is abdominal tenderness in the periumbilical area. There is guarding.  ?   Hernia: No hernia is present.  ?Musculoskeletal:     ?    General: No swelling.  ?   Cervical back: Neck supple.  ?Skin: ?   General: Skin is warm and dry.  ?   Capillary Refill: Capillary refill takes less than 2 seconds.  ?Neurological:  ?   General: No focal deficit present.  ?   Mental Status: She is alert and oriented to person, place, and time.  ?Psychiatric:     ?   Mood and Affect: Mood normal.  ? ? ?ED Results / Procedures / Treatments   ?Labs ?(all labs ordered are listed, but only abnormal results are displayed) ?Labs Reviewed  ?URINE CULTURE - Abnormal; Notable for the following components:  ?    Result Value  ? Culture MULTIPLE SPECIES PRESENT, SUGGEST RECOLLECTION (*)   ? All other components within normal limits  ?COMPREHENSIVE METABOLIC PANEL - Abnormal; Notable for the following components:  ? Potassium 3.3 (*)   ? Glucose, Bld 109 (*)   ? Total Protein 8.2 (*)   ? All other components within normal limits  ?CBC - Abnormal; Notable for the following components:  ? Hemoglobin 11.5 (*)   ? HCT 35.6 (*)   ? MCV 73.9 (*)   ? MCH 23.9 (*)   ? RDW 16.8 (*)   ? All other components within normal limits  ?URINALYSIS, ROUTINE W REFLEX MICROSCOPIC - Abnormal; Notable for the following components:  ? Hgb urine dipstick LARGE (*)   ? RBC / HPF >50 (*)   ? Bacteria, UA RARE (*)   ? All other components within normal limits  ?LIPASE, BLOOD  ?HCG, SERUM, QUALITATIVE  ?POC URINE PREG, ED  ? ? ?EKG ?None ? ?Radiology ?No results found. ? ?Procedures ?Procedures  ? ? ?Medications Ordered in ED ?Medications  ?ondansetron (ZOFRAN) injection 4 mg (4 mg Intravenous Given 02/19/22 0910)  ? ? ?ED Course/ Medical Decision Making/ A&P ?  ?                        ?Medical Decision Making ?Amount and/or Complexity of Data Reviewed ?Labs: ordered. ?Radiology: ordered. ? ?Risk ?Prescription drug management. ? ? ?Patient periumbilical tenderness.  We will get CBC complete metabolic panel, lipase, and urine pregnancy test.  Patient has an allergy to the dye.  Most likely will need CT scan  of abdomen without IV contrast. ? ?Patient's pregnancy test negative.  Lipase not elevated complete metabolic panel potassium a little low at 3.3.  Renal function normal.  CBC no leukocytosis hemoglobin 11.5.  Urinalysis greater than 50 RBCs.  Suggestive of either hematuria or possible urinary tract infection.  CT abdomen and pelvis with no acute process there was an umbilical hernia containing fat and nonobstructed small bowel.  Bilateral ovarian adnexal cyst up to four-point centimeters.  On the left no follow-up imaging required. ? ?Patient will be treated for  the umbilical pain and will be started on Keflex for the possible urinary tract infection.  Urine culture sent.  Patient stable for discharge home.  Will be treated with tramadol. ? ? ?Final Clinical Impression(s) / ED Diagnoses ?Final diagnoses:  ?Painless hematuria  ?Umbilical hernia without obstruction or gangrene  ? ? ?Rx / DC Orders ?ED Discharge Orders   ? ?      Ordered  ?  cephALEXin (KEFLEX) 500 MG capsule  4 times daily       ? 02/19/22 1218  ?  traMADol (ULTRAM) 50 MG tablet  Every 6 hours PRN       ? 02/19/22 1218  ? ?  ?  ? ?  ? ? ?  ?Fredia Sorrow, MD ?02/21/22 1515 ? ?

## 2022-02-19 NOTE — Discharge Instructions (Addendum)
CT shows evidence of an umbilical hernia.  But nothing is entrapped.  Make an appointment to follow-up Dr. Arnoldo Morale from surgery.  Call and make an appointment.  Urine showed some blood.  Based on this we will treat you with some antibiotics will need to follow-up with urology or your primary care doctor to make sure that the blood clears.  If that does not definitely need to be seen by urology.  CT scan did not show any kidney abnormalities or kidney stones. ? ?Take the antibiotic.  Take the pain medication as directed.  Follow-up with general surgery.  Follow-up with your doctor or urology to make sure that the blood in the urine clears. ?

## 2022-02-20 LAB — URINE CULTURE

## 2022-02-24 ENCOUNTER — Encounter: Payer: Self-pay | Admitting: General Surgery

## 2022-02-24 ENCOUNTER — Ambulatory Visit: Payer: Medicaid Other | Admitting: General Surgery

## 2022-02-24 VITALS — BP 145/84 | HR 76 | Temp 98.7°F | Resp 12 | Ht 62.0 in | Wt 161.0 lb

## 2022-02-24 DIAGNOSIS — K432 Incisional hernia without obstruction or gangrene: Secondary | ICD-10-CM | POA: Diagnosis not present

## 2022-02-24 NOTE — Progress Notes (Signed)
Sarah Cross; [id number]; 1981-02-07 ? ? ?HPI ?Patient is a 41 year old black female who was referred back to my care by the emergency room and Dr. Sharilyn Sites for evaluation and treatment of an umbilical hernia.  She was seen in the emergency room on 02/19/2022 and was found to have an umbilical hernia with small bowel present.  She states this for started earlier in May.  She started having swelling at her previous incision site.  She is status post excision of a large endometrioma involving the umbilicus and the fascia in 2022.  She states the swelling is made worse with straining.  She is currently not having abdominal pain or nausea. ?Past Medical History:  ?Diagnosis Date  ? Abnormal Pap smear   ? Endometriosis   ? GERD (gastroesophageal reflux disease)   ? History of DVT of lower extremity 2008  ? 1 week after bladder surgery  ? 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 Will type for #16  ? Hydrocephalus in newborn [facility])   ? Hypertension   ? Nausea 12/26/2014  ? PONV (postoperative nausea and vomiting)   ? Pregnant 12/26/2014  ? Sickle cell anemia ([facility])   ? has trait  ? Spotting during pregnancy in first trimester 12/26/2014  ? Vaginal Pap smear, abnormal   ? ? ?Past Surgical History:  ?Procedure Laterality Date  ? BLADDER TUMOR EXCISION    ? CESAREAN SECTION    ? CESAREAN SECTION N/A 07/20/2015  ? Procedure: REPEAT CESAREAN SECTION;  Surgeon: Jonnie Kind, MD;  Location: [facility];  Service: Obstetrics;  Laterality: N/A;  ? CSF SHUNT    ? MASS EXCISION N/A 07/04/2021  ? Procedure: EXCISION MASS; ABDOMEN;  Surgeon: Aviva Signs, MD;  Location: [facility];  Service: General;  Laterality: N/A;  ? SHOULDER ARTHROSCOPY WITH LABRAL REPAIR Right 10/02/2021  ? Procedure: SHOULDER ARTHROSCOPY WITH LABRAL REPAIR, BICEPS TENOTOMY;  Surgeon: Mordecai Rasmussen, MD;  Location: [facility];  Service: Orthopedics;  Laterality: Right;  ? ? ?Family History  ?Problem Relation Age of Onset  ? Diabetes Mother   ? Cancer Mother   ?     lung  ?  Hypertension Father   ? Heart disease Maternal Grandmother   ? Diabetes Maternal Grandmother   ? Heart disease Paternal Grandmother   ? Diabetes Paternal Grandmother   ? Cancer Paternal Grandfather   ? Diabetes Sister   ? Heart disease Sister   ? Thyroid disease Sister   ? Diabetes Sister   ? ? ?Current Outpatient Medications on File Prior to Visit  ?Medication Sig Dispense Refill  ? cephALEXin (KEFLEX) 500 MG capsule Take 1 capsule (500 mg total) by mouth 4 (four) times daily. 28 capsule 0  ? losartan (COZAAR) 25 MG tablet TAKE 1 TABLET(25 MG) BY MOUTH DAILY 90 tablet 4  ? metroNIDAZOLE (FLAGYL) 500 MG tablet Take 1 tablet (500 mg total) by mouth 2 (two) times daily. 14 tablet 0  ? norethindrone (MICRONOR) 0.35 MG tablet Take 1 tablet (0.35 mg total) by mouth daily. 28 tablet 11  ? OVER THE COUNTER MEDICATION Potassium    ? traMADol (ULTRAM) 50 MG tablet Take 1 tablet (50 mg total) by mouth every 6 (six) hours as needed. 15 tablet 0  ? ?No current facility-administered medications on file prior to visit.  ? ? ?Allergies  ?Allergen Reactions  ? Ivp Dye [Iodinated Contrast Media] Nausea And Vomiting  ? Latex Itching and Other (See Comments)  ?  Burns hands  ? ? ?  Social History  ? ?Substance and Sexual Activity  ?Alcohol Use No  ? ? ?Social History  ? ?Tobacco Use  ?Smoking Status Never  ?Smokeless Tobacco Never  ? ? ?Review of Systems  ?Constitutional: Negative.   ?HENT: Negative.    ?Eyes: Negative.   ?Respiratory: Negative.    ?Cardiovascular: Negative.   ?Gastrointestinal: Negative.   ?Genitourinary: Negative.   ?Musculoskeletal: Negative.   ?Skin: Negative.   ?Neurological: Negative.   ?Endo/Heme/Allergies: Negative.   ?Psychiatric/Behavioral: Negative.    ? ?Objective  ? ?Vitals:  ? 02/24/22 1549  ?BP: (!) 145/84  ?Pulse: 76  ?Resp: 12  ?Temp: 98.7 ?F (37.1 ?C)  ?SpO2: 99%  ? ? ?Physical Exam ?Vitals reviewed.  ?Constitutional:   ?   Appearance: Normal appearance. She is normal weight. She is not ill-appearing.   ?HENT:  ?   Head: Normocephalic and atraumatic.  ?Cardiovascular:  ?   Rate and Rhythm: Normal rate and regular rhythm.  ?   Heart sounds: Normal heart sounds. No murmur heard. ?  No friction rub. No gallop.  ?Pulmonary:  ?   Effort: Pulmonary effort is normal. No respiratory distress.  ?   Breath sounds: Normal breath sounds. No stridor. No wheezing, rhonchi or rales.  ?Abdominal:  ?   General: Abdomen is flat. Bowel sounds are normal. There is no distension.  ?   Palpations: Abdomen is soft. There is no mass.  ?   Tenderness: There is no abdominal tenderness. There is no guarding or rebound.  ?   Hernia: A hernia is present.  ?   Comments: A 3.5 cm in size easily reducible incisional hernia is present deep to the previous incision site.  The umbilicus is not present as it was excised due to the involvement with the endometrioma in 2022.  ?Skin: ?   General: Skin is warm and dry.  ?Neurological:  ?   Mental Status: She is alert and oriented to person, place, and time.  ?CT scan images personally reviewed ? ?Assessment  ?Incisional hernia ?Plan  ?Patient will call to schedule an incisional herniorrhaphy with mesh.  The risks and benefits of the procedure including bleeding, infection, mesh use, and the possibility of recurrence of the hernia were fully explained to the patient, who gave informed consent. ?

## 2022-02-26 NOTE — H&P (Signed)
Sarah Cross; [id number]; 1981-03-20   HPI Patient is a 41 year old black female who was referred back to my care by the emergency room and Dr. Sharilyn Sites for evaluation and treatment of an umbilical hernia.  She was seen in the emergency room on 02/19/2022 and was found to have an umbilical hernia with small bowel present.  She states this for started earlier in May.  She started having swelling at her previous incision site.  She is status post excision of a large endometrioma involving the umbilicus and the fascia in 2022.  She states the swelling is made worse with straining.  She is currently not having abdominal pain or nausea. 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   Hydrocephalus in newborn [facility])    Hypertension    Nausea 12/26/2014   PONV (postoperative nausea and vomiting)    Pregnant 12/26/2014   Sickle cell anemia ([place])    has trait   Spotting during pregnancy in first trimester 12/26/2014   Vaginal Pap smear, abnormal     Past Surgical History:  Procedure Laterality Date   BLADDER TUMOR EXCISION     CESAREAN SECTION     CESAREAN SECTION N/A 07/20/2015   Procedure: REPEAT CESAREAN SECTION;  Surgeon: Jonnie Kind, MD;  Location: [facility];  Service: Obstetrics;  Laterality: N/A;   CSF SHUNT     MASS EXCISION N/A 07/04/2021   Procedure: EXCISION MASS; ABDOMEN;  Surgeon: Aviva Signs, MD;  Location: [facility];  Service: General;  Laterality: N/A;   SHOULDER ARTHROSCOPY WITH LABRAL REPAIR Right 10/02/2021   Procedure: SHOULDER ARTHROSCOPY WITH LABRAL REPAIR, BICEPS TENOTOMY;  Surgeon: Mordecai Rasmussen, MD;  Location: [facility];  Service: Orthopedics;  Laterality: Right;    Family History  Problem Relation Age of Onset   Diabetes Mother    Cancer Mother        lung    Hypertension Father    Heart disease Maternal Grandmother    Diabetes Maternal Grandmother    Heart disease Paternal Grandmother    Diabetes Paternal Grandmother    Cancer Paternal Grandfather    Diabetes Sister    Heart disease Sister    Thyroid disease Sister    Diabetes Sister     Current Outpatient Medications on File Prior to Visit  Medication Sig Dispense Refill   cephALEXin (KEFLEX) 500 MG capsule Take 1 capsule (500 mg total) by mouth 4 (four) times daily. 28 capsule 0   losartan (COZAAR) 25 MG tablet TAKE 1 TABLET(25 MG) BY MOUTH DAILY 90 tablet 4   metroNIDAZOLE (FLAGYL) 500 MG tablet Take 1 tablet (500 mg total) by mouth 2 (two) times daily. 14 tablet 0   norethindrone (MICRONOR) 0.35 MG tablet Take 1 tablet (0.35 mg total) by mouth daily. 28 tablet 11   OVER THE COUNTER MEDICATION Potassium     traMADol (ULTRAM) 50 MG tablet Take 1 tablet (50 mg total) by mouth every 6 (six) hours as needed. 15 tablet 0   No current facility-administered medications on file prior to visit.    Allergies  Allergen Reactions   Ivp Dye [Iodinated Contrast Media] Nausea And Vomiting   Latex Itching and Other (See Comments)    Burns hands  Social History   Substance and Sexual Activity  Alcohol Use No    Social History   Tobacco Use  Smoking Status Never  Smokeless Tobacco Never    Review of Systems  Constitutional: Negative.   HENT: Negative.    Eyes: Negative.   Respiratory: Negative.    Cardiovascular: Negative.   Gastrointestinal: Negative.   Genitourinary: Negative.   Musculoskeletal: Negative.   Skin: Negative.   Neurological: Negative.   Endo/Heme/Allergies: Negative.   Psychiatric/Behavioral: Negative.     Objective   Vitals:   02/24/22 1549  BP: (!) 145/84  Pulse: 76  Resp: 12  Temp: 98.7 F (37.1 C)  SpO2: 99%    Physical Exam Vitals reviewed.  Constitutional:      Appearance: Normal appearance. She is normal weight. She is not ill-appearing.   HENT:     Head: Normocephalic and atraumatic.  Cardiovascular:     Rate and Rhythm: Normal rate and regular rhythm.     Heart sounds: Normal heart sounds. No murmur heard.   No friction rub. No gallop.  Pulmonary:     Effort: Pulmonary effort is normal. No respiratory distress.     Breath sounds: Normal breath sounds. No stridor. No wheezing, rhonchi or rales.  Abdominal:     General: Abdomen is flat. Bowel sounds are normal. There is no distension.     Palpations: Abdomen is soft. There is no mass.     Tenderness: There is no abdominal tenderness. There is no guarding or rebound.     Hernia: A hernia is present.     Comments: A 3.5 cm in size easily reducible incisional hernia is present deep to the previous incision site.  The umbilicus is not present as it was excised due to the involvement with the endometrioma in 2022.  Skin:    General: Skin is warm and dry.  Neurological:     Mental Status: She is alert and oriented to person, place, and time.  CT scan images personally reviewed  Assessment  Incisional hernia Plan  Patient will call to schedule an incisional herniorrhaphy with mesh.  The risks and benefits of the procedure including bleeding, infection, mesh use, and the possibility of recurrence of the hernia were fully explained to the patient, who gave informed consent.

## 2022-02-27 NOTE — Patient Instructions (Signed)
Sarah Cross  02/27/2022     '@PREFPERIOPPHARMACY'$ @   Your procedure is scheduled on  03/05/2022.   Report to [facility] at  [place].   Call this number if you have problems the morning of surgery:  [phone]   Remember:  Do not eat or drink after midnight.      Take these medicines the morning of surgery with A SIP OF WATER                                   tramadol(if needed).     Do not wear jewelry, make-up or nail polish.  Do not wear lotions, powders, or perfumes, or deodorant.  Do not shave 48 hours prior to surgery.  Men may shave face and neck.  Do not bring valuables to the hospital.  [facility] is not responsible for any belongings or valuables.  Contacts, dentures or bridgework may not be worn into surgery.  Leave your suitcase in the car.  After surgery it may be brought to your room.  For patients admitted to the hospital, discharge time will be determined by your treatment team.  Patients discharged the day of surgery will not be allowed to drive home and must have someone with them for 24 hours.    Special instructions:   DO NOT smoke tobacco or vape for 24 hours before your procedure.  Please read over the following fact sheets that you were given. Anesthesia Post-op Instructions and Care and Recovery After Surgery      Open Hernia Repair, Adult, Care After What can I expect after the procedure? After the procedure, it is common to have: Mild discomfort. Slight bruising. Mild swelling. Pain in the belly (abdomen). A small amount of blood from the cut from surgery (incision). Follow these instructions at home: Your doctor may give you more specific instructions. If you have problems, call your doctor. Medicines Take over-the-counter and prescription medicines only as told by your doctor. If told, take steps to prevent problems with pooping (constipation). You may need to: Drink enough fluid to keep your pee (urine) pale  yellow. Take medicines. You will be told what medicines to take. Eat foods that are high in fiber. These include beans, whole grains, and fresh fruits and vegetables. Limit foods that are high in fat and sugar. These include fried or sweet foods. Ask your doctor if you should avoid driving or using machines while you are taking your medicine. Incision care  Follow instructions from your doctor about how to take care of your incision. Make sure you: Wash your hands with soap and water for at least 20 seconds before and after you change your bandage (dressing). If you cannot use soap and water, use hand sanitizer. Change your bandage. Leave stitches or skin glue in place for at least 2 weeks. Leave tape strips alone unless you are told to take them off. You may trim the edges of the tape strips if they curl up. Check your incision every day for signs of infection. Check for: More redness, swelling, or pain. More fluid or blood. Warmth. Pus or a bad smell. Wear loose, soft clothing while your incision heals. Activity  Rest as told by your doctor. Do not lift anything that is heavier than 10 lb (4.5 kg), or the limit that you are told. Do not play contact sports until your  doctor says that this is safe. If you were given a sedative during your procedure, do not drive or use machines until your doctor says that it is safe. A sedative is a medicine that helps you relax. Return to your normal activities when your doctor says that it is safe. General instructions Do not take baths, swim, or use a hot tub. Ask your doctor about taking showers or sponge baths. Hold a pillow over your belly when you cough or sneeze. This helps with pain. Do not smoke or use any products that contain nicotine or tobacco. If you need help quitting, ask your doctor. Keep all follow-up visits. Contact a doctor if: You have any of these signs of infection in or around your incision: More redness, swelling, or  pain. More fluid or blood. Warmth. Pus. A bad smell. You have a fever or chills. You have blood in your poop (stool). You have not pooped (had a bowel movement) in 2-3 days. Medicine does not help your pain. Get help right away if: You have chest pain, or you are short of breath. You feel faint or light-headed. You have very bad pain. You vomit and your pain is worse. You have pain, swelling, or redness in a leg. These symptoms may be an emergency. Get help right away. Call your local emergency services (911 in the [place] Do not wait to see if the symptoms will go away. Do not drive yourself to the hospital. Summary After this procedure, it is common to have mild discomfort, slight bruising, and mild swelling. Follow instructions from your doctor about how to take care of your cut from surgery (incision). Check every day for signs of infection. Do not lift heavy objects or play contact sports until your doctor says it is safe. Return to your normal activities as told by your doctor. This information is not intended to replace advice given to you by your health care provider. Make sure you discuss any questions you have with your health care provider. Document Revised: 05/13/2020 Document Reviewed: 05/13/2020 Elsevier Patient Education  [place]. How to Use Chlorhexidine for Bathing Chlorhexidine gluconate (CHG) is a germ-killing (antiseptic) solution that is used to clean the skin. It can get rid of the bacteria that normally live on the skin and can keep them away for about 24 hours. To clean your skin with CHG, you may be given: A CHG solution to use in the shower or as part of a sponge bath. A prepackaged cloth that contains CHG. Cleaning your skin with CHG may help lower the risk for infection: While you are staying in the intensive care unit of the hospital. If you have a vascular access, such as a central line, to provide short-term or long-term access to your  veins. If you have a catheter to drain urine from your bladder. If you are on a ventilator. A ventilator is a machine that helps you breathe by moving air in and out of your lungs. After surgery. What are the risks? Risks of using CHG include: A skin reaction. Hearing loss, if CHG gets in your ears and you have a perforated eardrum. Eye injury, if CHG gets in your eyes and is not rinsed out. The CHG product catching fire. Make sure that you avoid smoking and flames after applying CHG to your skin. Do not use CHG: If you have a chlorhexidine allergy or have previously reacted to chlorhexidine. On babies younger than 89 months of age. How to use CHG  solution Use CHG only as told by your health care provider, and follow the instructions on the label. Use the full amount of CHG as directed. Usually, this is one bottle. During a shower Follow these steps when using CHG solution during a shower (unless your health care provider gives you different instructions): Start the shower. Use your normal soap and shampoo to wash your face and hair. Turn off the shower or move out of the shower stream. Pour the CHG onto a clean washcloth. Do not use any type of brush or rough-edged sponge. Starting at your neck, lather your body down to your toes. Make sure you follow these instructions: If you will be having surgery, pay special attention to the part of your body where you will be having surgery. Scrub this area for at least 1 minute. Do not use CHG on your head or face. If the solution gets into your ears or eyes, rinse them well with water. Avoid your genital area. Avoid any areas of skin that have broken skin, cuts, or scrapes. Scrub your back and under your arms. Make sure to wash skin folds. Let the lather sit on your skin for 1-2 minutes or as long as told by your health care provider. Thoroughly rinse your entire body in the shower. Make sure that all body creases and crevices are rinsed  well. Dry off with a clean towel. Do not put any substances on your body afterward--such as powder, lotion, or perfume--unless you are told to do so by your health care provider. Only use lotions that are recommended by the manufacturer. Put on clean clothes or pajamas. If it is the night before your surgery, sleep in clean sheets.  During a sponge bath Follow these steps when using CHG solution during a sponge bath (unless your health care provider gives you different instructions): Use your normal soap and shampoo to wash your face and hair. Pour the CHG onto a clean washcloth. Starting at your neck, lather your body down to your toes. Make sure you follow these instructions: If you will be having surgery, pay special attention to the part of your body where you will be having surgery. Scrub this area for at least 1 minute. Do not use CHG on your head or face. If the solution gets into your ears or eyes, rinse them well with water. Avoid your genital area. Avoid any areas of skin that have broken skin, cuts, or scrapes. Scrub your back and under your arms. Make sure to wash skin folds. Let the lather sit on your skin for 1-2 minutes or as long as told by your health care provider. Using a different clean, wet washcloth, thoroughly rinse your entire body. Make sure that all body creases and crevices are rinsed well. Dry off with a clean towel. Do not put any substances on your body afterward--such as powder, lotion, or perfume--unless you are told to do so by your health care provider. Only use lotions that are recommended by the manufacturer. Put on clean clothes or pajamas. If it is the night before your surgery, sleep in clean sheets. How to use CHG prepackaged cloths Only use CHG cloths as told by your health care provider, and follow the instructions on the label. Use the CHG cloth on clean, dry skin. Do not use the CHG cloth on your head or face unless your health care provider tells  you to. When washing with the CHG cloth: Avoid your genital area. Avoid any areas of  skin that have broken skin, cuts, or scrapes. Before surgery Follow these steps when using a CHG cloth to clean before surgery (unless your health care provider gives you different instructions): Using the CHG cloth, vigorously scrub the part of your body where you will be having surgery. Scrub using a back-and-forth motion for 3 minutes. The area on your body should be completely wet with CHG when you are done scrubbing. Do not rinse. Discard the cloth and let the area air-dry. Do not put any substances on the area afterward, such as powder, lotion, or perfume. Put on clean clothes or pajamas. If it is the night before your surgery, sleep in clean sheets.  For general bathing Follow these steps when using CHG cloths for general bathing (unless your health care provider gives you different instructions). Use a separate CHG cloth for each area of your body. Make sure you wash between any folds of skin and between your fingers and toes. Wash your body in the following order, switching to a new cloth after each step: The front of your neck, shoulders, and chest. Both of your arms, under your arms, and your hands. Your stomach and groin area, avoiding the genitals. Your right leg and foot. Your left leg and foot. The back of your neck, your back, and your buttocks. Do not rinse. Discard the cloth and let the area air-dry. Do not put any substances on your body afterward--such as powder, lotion, or perfume--unless you are told to do so by your health care provider. Only use lotions that are recommended by the manufacturer. Put on clean clothes or pajamas. Contact a health care provider if: Your skin gets irritated after scrubbing. You have questions about using your solution or cloth. You swallow any chlorhexidine. Call your local poison control center (1-(417)453-0712 in the [place] Get help right away if: Your  eyes itch badly, or they become very red or swollen. Your skin itches badly and is red or swollen. Your hearing changes. You have trouble seeing. You have swelling or tingling in your mouth or throat. You have trouble breathing. These symptoms may represent a serious problem that is an emergency. Do not wait to see if the symptoms will go away. Get medical help right away. Call your local emergency services (911 in the [place] Do not drive yourself to the hospital. Summary Chlorhexidine gluconate (CHG) is a germ-killing (antiseptic) solution that is used to clean the skin. Cleaning your skin with CHG may help to lower your risk for infection. You may be given CHG to use for bathing. It may be in a bottle or in a prepackaged cloth to use on your skin. Carefully follow your health care provider's instructions and the instructions on the product label. Do not use CHG if you have a chlorhexidine allergy. Contact your health care provider if your skin gets irritated after scrubbing. This information is not intended to replace advice given to you by your health care provider. Make sure you discuss any questions you have with your health care provider. Document Revised: 12/09/2020 Document Reviewed: 12/09/2020 Elsevier Patient Education  [place].

## 2022-03-02 ENCOUNTER — Emergency Department (HOSPITAL_COMMUNITY)
Admission: EM | Admit: 2022-03-02 | Discharge: 2022-03-03 | Disposition: A | Discharge status: Home / Self Care | Payer: Medicaid Other | Attending: Emergency Medicine | Admitting: Emergency Medicine

## 2022-03-02 ENCOUNTER — Encounter (HOSPITAL_COMMUNITY): Payer: Self-pay | Admitting: Emergency Medicine

## 2022-03-02 ENCOUNTER — Other Ambulatory Visit: Payer: Self-pay

## 2022-03-02 DIAGNOSIS — K429 Umbilical hernia without obstruction or gangrene: Secondary | ICD-10-CM | POA: Diagnosis not present

## 2022-03-02 DIAGNOSIS — R1013 Epigastric pain: Secondary | ICD-10-CM

## 2022-03-02 DIAGNOSIS — R112 Nausea with vomiting, unspecified: Secondary | ICD-10-CM | POA: Diagnosis present

## 2022-03-02 DIAGNOSIS — Z91040 Latex allergy status: Secondary | ICD-10-CM | POA: Diagnosis not present

## 2022-03-02 DIAGNOSIS — R1110 Vomiting, unspecified: Secondary | ICD-10-CM

## 2022-03-02 NOTE — ED Triage Notes (Signed)
Pt c/o abd pain with n/v.  

## 2022-03-03 ENCOUNTER — Encounter (HOSPITAL_COMMUNITY)
Admission: RE | Admit: 2022-03-03 | Discharge: 2022-03-03 | Disposition: A | Discharge status: Home / Self Care | Payer: Medicaid Other | Source: Ambulatory Visit | Attending: General Surgery | Admitting: General Surgery

## 2022-03-03 ENCOUNTER — Encounter (HOSPITAL_COMMUNITY): Payer: Self-pay

## 2022-03-03 ENCOUNTER — Other Ambulatory Visit (HOSPITAL_COMMUNITY): Payer: Medicaid Other

## 2022-03-03 VITALS — BP 125/66 | HR 64 | Temp 97.8°F | Resp 18 | Ht 62.0 in | Wt 160.9 lb

## 2022-03-03 DIAGNOSIS — Z01812 Encounter for preprocedural laboratory examination: Secondary | ICD-10-CM | POA: Insufficient documentation

## 2022-03-03 DIAGNOSIS — Z01818 Encounter for other preprocedural examination: Secondary | ICD-10-CM

## 2022-03-03 HISTORY — DX: Anemia, unspecified: D64.9

## 2022-03-03 LAB — CBC WITH DIFFERENTIAL/PLATELET
Abs Immature Granulocytes: 0.04 10*3/uL (ref 0.00–0.07)
Basophils Absolute: 0 10*3/uL (ref 0.0–0.1)
Basophils Relative: 0 %
Eosinophils Absolute: 0 10*3/uL (ref 0.0–0.5)
Eosinophils Relative: 0 %
HCT: 37.4 % (ref 36.0–46.0)
Hemoglobin: 11.6 g/dL — ABNORMAL LOW (ref 12.0–15.0)
Immature Granulocytes: 0 %
Lymphocytes Relative: 8 %
Lymphs Abs: 0.9 10*3/uL (ref 0.7–4.0)
MCH: 23.1 pg — ABNORMAL LOW (ref 26.0–34.0)
MCHC: 31 g/dL (ref 30.0–36.0)
MCV: 74.5 fL — ABNORMAL LOW (ref 80.0–100.0)
Monocytes Absolute: 0.4 10*3/uL (ref 0.1–1.0)
Monocytes Relative: 3 %
Neutro Abs: 10.1 10*3/uL — ABNORMAL HIGH (ref 1.7–7.7)
Neutrophils Relative %: 89 %
Platelets: 365 10*3/uL (ref 150–400)
RBC: 5.02 MIL/uL (ref 3.87–5.11)
RDW: 16.6 % — ABNORMAL HIGH (ref 11.5–15.5)
WBC: 11.5 10*3/uL — ABNORMAL HIGH (ref 4.0–10.5)
nRBC: 0 % (ref 0.0–0.2)

## 2022-03-03 LAB — COMPREHENSIVE METABOLIC PANEL
ALT: 15 U/L (ref 0–44)
AST: 21 U/L (ref 15–41)
Albumin: 4.3 g/dL (ref 3.5–5.0)
Alkaline Phosphatase: 62 U/L (ref 38–126)
Anion gap: 8 (ref 5–15)
BUN: 9 mg/dL (ref 6–20)
CO2: 24 mmol/L (ref 22–32)
Calcium: 9.2 mg/dL (ref 8.9–10.3)
Chloride: 104 mmol/L (ref 98–111)
Creatinine, Ser: 0.76 mg/dL (ref 0.44–1.00)
GFR, Estimated: >60 mL/min (ref >60)
Glucose, Bld: 97 mg/dL (ref 70–99)
Potassium: 3.5 mmol/L (ref 3.5–5.1)
Sodium: 136 mmol/L (ref 135–145)
Total Bilirubin: 0.6 mg/dL (ref 0.3–1.2)
Total Protein: 7.8 g/dL (ref 6.5–8.1)

## 2022-03-03 LAB — LIPASE, BLOOD: Lipase: 30 U/L (ref 11–51)

## 2022-03-03 LAB — POCT PREGNANCY, URINE: Preg Test, Ur: NEGATIVE

## 2022-03-03 MED ORDER — ONDANSETRON 4 MG PO TBDP: ORAL_TABLET | ORAL | 0 refills | Status: DC

## 2022-03-03 MED ORDER — ONDANSETRON HCL 4 MG/2ML IJ SOLN
4.0000 mg | Freq: Once | INTRAMUSCULAR | Status: DC
  Filled 2022-03-03: qty 2

## 2022-03-03 MED ORDER — LIDOCAINE VISCOUS HCL 2 % MT SOLN
15.0000 mL | Freq: Once | OROMUCOSAL | Status: DC
  Filled 2022-03-03: qty 15

## 2022-03-03 MED ORDER — ONDANSETRON 8 MG PO TBDP
8.0000 mg | ORAL_TABLET | Freq: Once | ORAL | Status: AC
  Administered 2022-03-03: 8 mg via ORAL
  Filled 2022-03-03: qty 1

## 2022-03-03 MED ORDER — LACTATED RINGERS IV BOLUS: 1000.0000 mL | Freq: Once | INTRAVENOUS | Status: DC

## 2022-03-03 MED ORDER — ONDANSETRON HCL 4 MG PO TABS: 4.0000 mg | ORAL_TABLET | Freq: Three times a day (TID) | ORAL | 0 refills | Status: DC | PRN

## 2022-03-03 MED ORDER — ALUM & MAG HYDROXIDE-SIMETH 400-400-40 MG/5ML PO SUSP: 15.0000 mL | Freq: Four times a day (QID) | ORAL | 0 refills | Status: DC | PRN

## 2022-03-03 MED ORDER — PANTOPRAZOLE SODIUM 20 MG PO TBEC: 20.0000 mg | DELAYED_RELEASE_TABLET | Freq: Every day | ORAL | 0 refills | Status: DC

## 2022-03-03 MED ORDER — ALUM & MAG HYDROXIDE-SIMETH 200-200-20 MG/5ML PO SUSP
30.0000 mL | Freq: Once | ORAL | Status: AC
Start: 2022-03-03 — End: 2022-03-03
  Administered 2022-03-03: 30 mL via ORAL
  Filled 2022-03-03: qty 30

## 2022-03-03 NOTE — ED Notes (Signed)
Pt tolerated PO fluids and maalox with no vomiting

## 2022-03-03 NOTE — ED Notes (Signed)
This RN attempted IV without and with [place]. No success. Provider notified.

## 2022-03-03 NOTE — ED Notes (Signed)
Pt experiencing a bit of nausea. Pt wants to hold off on GI cocktail until her nausea improves

## 2022-03-03 NOTE — ED Notes (Signed)
Pt provided water and took a few sips. Pt notified to keep sipping water as tolerated

## 2022-03-03 NOTE — ED Provider Notes (Signed)
[facility] EMERGENCY DEPARTMENT Provider Note   CSN: [id number] Arrival date & time: 03/02/22  2345     History  Chief Complaint  Patient presents with   Abdominal Pain    Sarah Cross is a 41 y.o. female.  41 year old female who presents the ER today with upper abdominal pain associated with 1 episode of emesis still with nausea.  Patient states that this started around 1030 tonight.  She has a known umbilical hernia and scheduled for surgery on Thursday.  No fevers.  No urinary symptoms.  She has been having normal bowel movements.  Still passing gas.  No abdominal distention   Abdominal Pain     Home Medications Prior to Admission medications   Medication Sig Start Date End Date Taking? Authorizing Provider  alum & mag hydroxide-simeth (MAALOX PLUS) 400-400-40 MG/5ML suspension Take 15 mLs by mouth every 6 (six) hours as needed for indigestion. 03/03/22  Yes Rhen Dossantos, Corene Cornea, MD  ondansetron (ZOFRAN) 4 MG tablet Take 1 tablet (4 mg total) by mouth every 8 (eight) hours as needed for nausea or vomiting. 03/03/22  Yes Miki Labuda, Corene Cornea, MD  pantoprazole (PROTONIX) 20 MG tablet Take 1 tablet (20 mg total) by mouth daily. 03/03/22  Yes Jahan Friedlander, Corene Cornea, MD  acetaminophen (TYLENOL) 500 MG tablet Take 1,000 mg by mouth every 6 (six) hours as needed for moderate pain.    [provider]  cephALEXin (KEFLEX) 500 MG capsule Take 1 capsule (500 mg total) by mouth 4 (four) times daily. 02/19/22   Fredia Sorrow, MD  losartan (COZAAR) 25 MG tablet TAKE 1 TABLET(25 MG) BY MOUTH DAILY 10/01/21   Estill Dooms, NP  Multiple Vitamins-Minerals (HAIR SKIN AND NAILS FORMULA) TABS Take 1 tablet by mouth daily.    [provider]  norethindrone (MICRONOR) 0.35 MG tablet Take 1 tablet (0.35 mg total) by mouth daily. 05/19/21   Estill Dooms, NP  ondansetron (ZOFRAN-ODT) 4 MG disintegrating tablet '4mg'$  ODT q4 hours prn nausea/vomit 03/03/22   Ibrahim Mcpheeters, Corene Cornea, MD  POTASSIUM PO  Take 1 tablet by mouth daily.    [provider]  traMADol (ULTRAM) 50 MG tablet Take 1 tablet (50 mg total) by mouth every 6 (six) hours as needed. 02/19/22   Fredia Sorrow, MD      Allergies    Ivp dye [iodinated contrast media] and Latex    Review of Systems   Review of Systems  Gastrointestinal:  Positive for abdominal pain.   Physical Exam Updated Vital Signs BP 135/85   Pulse 90   Temp 97.8 F (36.6 C)   Resp 19   Ht '5\' 2"'$  (1.575 m)   Wt 73 kg   LMP 02/17/2022   SpO2 98%   BMI 29.44 kg/m  Physical Exam Vitals and nursing note reviewed.  Constitutional:      Appearance: She is well-developed.  HENT:     Head: Normocephalic and atraumatic.  Cardiovascular:     Rate and Rhythm: Normal rate and regular rhythm.  Pulmonary:     Effort: No respiratory distress.     Breath sounds: No stridor.  Abdominal:     General: There is no distension.     Hernia: A hernia is present. Hernia is present in the umbilical area.  Musculoskeletal:     Cervical back: Normal range of motion.  Skin:    General: Skin is warm and dry.  Neurological:     General: No focal deficit present.     Mental Status:  She is alert.    ED Results / Procedures / Treatments   Labs (all labs ordered are listed, but only abnormal results are displayed) Labs Reviewed  CBC WITH DIFFERENTIAL/PLATELET - Abnormal; Notable for the following components:      Result Value   WBC 11.5 (*)    Hemoglobin 11.6 (*)    MCV 74.5 (*)    MCH 23.1 (*)    RDW 16.6 (*)    Neutro Abs 10.1 (*)    All other components within normal limits  COMPREHENSIVE METABOLIC PANEL  LIPASE, BLOOD    EKG None  Radiology No results found.  Procedures Procedures    Medications Ordered in ED Medications  alum & mag hydroxide-simeth (MAALOX/MYLANTA) 200-200-20 MG/5ML suspension 30 mL (30 mLs Oral Given 03/03/22 0453)    And  lidocaine (XYLOCAINE) 2 % viscous mouth solution 15 mL (0 mLs Oral Hold 03/03/22 0345)   ondansetron (ZOFRAN-ODT) disintegrating tablet 8 mg (8 mg Oral Given 03/03/22 0240)    ED Course/ Medical Decision Making/ A&P                           Medical Decision Making Amount and/or Complexity of Data Reviewed Labs: ordered.  Risk OTC drugs. Prescription drug management.  41 year old female here with what seems like gastritis versus esophagitis.  Will check basic labs to make sure she not severely dehydrated.  Her abdomen is benign nonsurgical.  We will also treat her symptomatically.  Patient feeling better but not ready to try PO solids but has been tolerating fluids.  Will continue to monitor.   Pain gone after maalox. Tolerating PO. Appears stable for discharge with outpatient follow up.   Final Clinical Impression(s) / ED Diagnoses Final diagnoses:  Vomiting, unspecified vomiting type, unspecified whether nausea present  Epigastric pain    Rx / DC Orders ED Discharge Orders          Ordered    alum & mag hydroxide-simeth (MAALOX PLUS) 400-400-40 MG/5ML suspension  Every 6 hours PRN        03/03/22 0531    pantoprazole (PROTONIX) 20 MG tablet  Daily        03/03/22 0531    ondansetron (ZOFRAN-ODT) 4 MG disintegrating tablet  Status:  Discontinued        03/03/22 0531    ondansetron (ZOFRAN-ODT) 4 MG disintegrating tablet        03/03/22 0531    ondansetron (ZOFRAN) 4 MG tablet  Every 8 hours PRN        03/03/22 0531              Maryetta Shafer, Corene Cornea, MD 03/03/22 [id number]

## 2022-03-04 MED FILL — Ondansetron HCl Tab 4 MG: ORAL | Qty: 4 | Status: AC

## 2022-03-05 ENCOUNTER — Other Ambulatory Visit: Payer: Self-pay

## 2022-03-05 ENCOUNTER — Encounter (HOSPITAL_COMMUNITY): Payer: Self-pay | Admitting: General Surgery

## 2022-03-05 ENCOUNTER — Encounter (HOSPITAL_COMMUNITY)
Admission: RE | Disposition: A | Discharge status: Home / Self Care | Payer: Self-pay | Source: Home / Self Care | Attending: General Surgery

## 2022-03-05 ENCOUNTER — Ambulatory Visit (HOSPITAL_COMMUNITY)
Admission: RE | Admit: 2022-03-05 | Discharge: 2022-03-05 | Disposition: A | Discharge status: Home / Self Care | Payer: Medicaid Other | Attending: General Surgery | Admitting: General Surgery

## 2022-03-05 ENCOUNTER — Ambulatory Visit (HOSPITAL_COMMUNITY): Payer: Medicaid Other | Admitting: Anesthesiology

## 2022-03-05 ENCOUNTER — Ambulatory Visit (HOSPITAL_BASED_OUTPATIENT_CLINIC_OR_DEPARTMENT_OTHER): Payer: Medicaid Other | Admitting: Anesthesiology

## 2022-03-05 DIAGNOSIS — O34219 Maternal care for unspecified type scar from previous cesarean delivery: Secondary | ICD-10-CM

## 2022-03-05 DIAGNOSIS — B9689 Other specified bacterial agents as the cause of diseases classified elsewhere: Secondary | ICD-10-CM

## 2022-03-05 DIAGNOSIS — N83202 Unspecified ovarian cyst, left side: Secondary | ICD-10-CM

## 2022-03-05 DIAGNOSIS — D573 Sickle-cell trait: Secondary | ICD-10-CM

## 2022-03-05 DIAGNOSIS — K432 Incisional hernia without obstruction or gangrene: Secondary | ICD-10-CM | POA: Diagnosis not present

## 2022-03-05 DIAGNOSIS — R42 Dizziness and giddiness: Secondary | ICD-10-CM

## 2022-03-05 DIAGNOSIS — B977 Papillomavirus as the cause of diseases classified elsewhere: Secondary | ICD-10-CM

## 2022-03-05 DIAGNOSIS — Z982 Presence of cerebrospinal fluid drainage device: Secondary | ICD-10-CM

## 2022-03-05 DIAGNOSIS — I10 Essential (primary) hypertension: Secondary | ICD-10-CM | POA: Diagnosis not present

## 2022-03-05 DIAGNOSIS — Z3041 Encounter for surveillance of contraceptive pills: Secondary | ICD-10-CM

## 2022-03-05 DIAGNOSIS — Z01419 Encounter for gynecological examination (general) (routine) without abnormal findings: Secondary | ICD-10-CM

## 2022-03-05 DIAGNOSIS — L910 Hypertrophic scar: Secondary | ICD-10-CM

## 2022-03-05 DIAGNOSIS — Z0000 Encounter for general adult medical examination without abnormal findings: Secondary | ICD-10-CM

## 2022-03-05 DIAGNOSIS — R110 Nausea: Secondary | ICD-10-CM

## 2022-03-05 DIAGNOSIS — Z86718 Personal history of other venous thrombosis and embolism: Secondary | ICD-10-CM

## 2022-03-05 DIAGNOSIS — N939 Abnormal uterine and vaginal bleeding, unspecified: Secondary | ICD-10-CM

## 2022-03-05 DIAGNOSIS — Z1211 Encounter for screening for malignant neoplasm of colon: Secondary | ICD-10-CM

## 2022-03-05 DIAGNOSIS — K219 Gastro-esophageal reflux disease without esophagitis: Secondary | ICD-10-CM | POA: Diagnosis not present

## 2022-03-05 DIAGNOSIS — K429 Umbilical hernia without obstruction or gangrene: Secondary | ICD-10-CM | POA: Diagnosis present

## 2022-03-05 DIAGNOSIS — N760 Acute vaginitis: Secondary | ICD-10-CM

## 2022-03-05 DIAGNOSIS — D492 Neoplasm of unspecified behavior of bone, soft tissue, and skin: Secondary | ICD-10-CM

## 2022-03-05 DIAGNOSIS — Z7689 Persons encountering health services in other specified circumstances: Secondary | ICD-10-CM

## 2022-03-05 DIAGNOSIS — N80129 Deep endometriosis of ovary, unspecified ovary: Secondary | ICD-10-CM

## 2022-03-05 DIAGNOSIS — Z01818 Encounter for other preprocedural examination: Secondary | ICD-10-CM

## 2022-03-05 DIAGNOSIS — Z3202 Encounter for pregnancy test, result negative: Secondary | ICD-10-CM

## 2022-03-05 DIAGNOSIS — Z124 Encounter for screening for malignant neoplasm of cervix: Secondary | ICD-10-CM

## 2022-03-05 DIAGNOSIS — N921 Excessive and frequent menstruation with irregular cycle: Secondary | ICD-10-CM

## 2022-03-05 HISTORY — PX: INCISIONAL HERNIA REPAIR: SHX193

## 2022-03-05 SURGERY — REPAIR, HERNIA, INCISIONAL
Anesthesia: General

## 2022-03-05 MED ORDER — HYDROMORPHONE HCL 1 MG/ML IJ SOLN: 0.2500 mg | INTRAMUSCULAR | Status: DC | PRN

## 2022-03-05 MED ORDER — BUPIVACAINE LIPOSOME 1.3 % IJ SUSP
INTRAMUSCULAR | Status: DC | PRN
  Administered 2022-03-05: 20 mL

## 2022-03-05 MED ORDER — SUGAMMADEX SODIUM 500 MG/5ML IV SOLN
INTRAVENOUS | Status: DC | PRN
  Administered 2022-03-05: 300 mg via INTRAVENOUS

## 2022-03-05 MED ORDER — MIDAZOLAM HCL 2 MG/2ML IJ SOLN
INTRAMUSCULAR | Status: AC
  Filled 2022-03-05: qty 2

## 2022-03-05 MED ORDER — CEFAZOLIN SODIUM-DEXTROSE 2-4 GM/100ML-% IV SOLN
2.0000 g | INTRAVENOUS | Status: AC
  Administered 2022-03-05: 2 g via INTRAVENOUS

## 2022-03-05 MED ORDER — CHLORHEXIDINE GLUCONATE 0.12 % MT SOLN
15.0000 mL | Freq: Once | OROMUCOSAL | Status: AC
  Administered 2022-03-05: 15 mL via OROMUCOSAL

## 2022-03-05 MED ORDER — POVIDONE-IODINE 10 % OINT PACKET
TOPICAL_OINTMENT | CUTANEOUS | Status: DC | PRN
  Administered 2022-03-05: 1 via TOPICAL

## 2022-03-05 MED ORDER — ROCURONIUM BROMIDE 10 MG/ML (PF) SYRINGE
PREFILLED_SYRINGE | INTRAVENOUS | Status: DC | PRN
  Administered 2022-03-05: 50 mg via INTRAVENOUS

## 2022-03-05 MED ORDER — FENTANYL CITRATE (PF) 100 MCG/2ML IJ SOLN
INTRAMUSCULAR | Status: DC | PRN
  Administered 2022-03-05: 50 ug via INTRAVENOUS
  Administered 2022-03-05: 100 ug via INTRAVENOUS
  Administered 2022-03-05: 50 ug via INTRAVENOUS

## 2022-03-05 MED ORDER — ROCURONIUM BROMIDE 10 MG/ML (PF) SYRINGE
PREFILLED_SYRINGE | INTRAVENOUS | Status: AC
  Filled 2022-03-05: qty 10

## 2022-03-05 MED ORDER — CEFAZOLIN SODIUM-DEXTROSE 2-4 GM/100ML-% IV SOLN
INTRAVENOUS | Status: AC
  Filled 2022-03-05: qty 100

## 2022-03-05 MED ORDER — PROPOFOL 10 MG/ML IV BOLUS
INTRAVENOUS | Status: DC | PRN
Start: 2022-03-05 — End: 2022-03-05
  Administered 2022-03-05: 150 mg via INTRAVENOUS

## 2022-03-05 MED ORDER — ORAL CARE MOUTH RINSE: 15.0000 mL | Freq: Once | OROMUCOSAL | Status: AC

## 2022-03-05 MED ORDER — POVIDONE-IODINE 10 % EX OINT
TOPICAL_OINTMENT | CUTANEOUS | Status: AC
  Filled 2022-03-05: qty 1

## 2022-03-05 MED ORDER — MIDAZOLAM HCL 2 MG/2ML IJ SOLN
INTRAMUSCULAR | Status: DC | PRN
  Administered 2022-03-05: 2 mg via INTRAVENOUS

## 2022-03-05 MED ORDER — ONDANSETRON HCL 4 MG/2ML IJ SOLN
INTRAMUSCULAR | Status: AC
  Filled 2022-03-05: qty 2

## 2022-03-05 MED ORDER — LIDOCAINE HCL (CARDIAC) PF 100 MG/5ML IV SOSY
PREFILLED_SYRINGE | INTRAVENOUS | Status: DC | PRN
  Administered 2022-03-05: 60 mg via INTRATRACHEAL

## 2022-03-05 MED ORDER — FENTANYL CITRATE (PF) 250 MCG/5ML IJ SOLN
INTRAMUSCULAR | Status: AC
  Filled 2022-03-05: qty 5

## 2022-03-05 MED ORDER — OXYCODONE-ACETAMINOPHEN 5-325 MG PO TABS: 1.0000 | ORAL_TABLET | ORAL | 0 refills | Status: DC | PRN

## 2022-03-05 MED ORDER — LACTATED RINGERS IV SOLN: INTRAVENOUS | Status: DC

## 2022-03-05 MED ORDER — MEPERIDINE HCL 50 MG/ML IJ SOLN: 6.2500 mg | INTRAMUSCULAR | Status: DC | PRN

## 2022-03-05 MED ORDER — DEXAMETHASONE SODIUM PHOSPHATE 4 MG/ML IJ SOLN
INTRAMUSCULAR | Status: DC | PRN
  Administered 2022-03-05: 4 mg via INTRAVENOUS

## 2022-03-05 MED ORDER — 0.9 % SODIUM CHLORIDE (POUR BTL) OPTIME
TOPICAL | Status: DC | PRN
  Administered 2022-03-05: 1000 mL

## 2022-03-05 MED ORDER — PROPOFOL 10 MG/ML IV BOLUS
INTRAVENOUS | Status: AC
  Filled 2022-03-05: qty 20

## 2022-03-05 MED ORDER — KETOROLAC TROMETHAMINE 30 MG/ML IJ SOLN
30.0000 mg | Freq: Once | INTRAMUSCULAR | Status: AC
  Administered 2022-03-05: 30 mg via INTRAVENOUS
  Filled 2022-03-05: qty 1

## 2022-03-05 MED ORDER — ONDANSETRON HCL 4 MG/2ML IJ SOLN: 4.0000 mg | Freq: Once | INTRAMUSCULAR | Status: DC | PRN

## 2022-03-05 MED ORDER — DEXAMETHASONE SODIUM PHOSPHATE 4 MG/ML IJ SOLN
INTRAMUSCULAR | Status: AC
  Filled 2022-03-05: qty 1

## 2022-03-05 MED ORDER — CHLORHEXIDINE GLUCONATE CLOTH 2 % EX PADS: 6.0000 | MEDICATED_PAD | Freq: Once | CUTANEOUS | Status: DC

## 2022-03-05 MED ORDER — ONDANSETRON HCL 4 MG/2ML IJ SOLN
INTRAMUSCULAR | Status: DC | PRN
  Administered 2022-03-05: 4 mg via INTRAVENOUS

## 2022-03-05 SURGICAL SUPPLY — 41 items
ADH SKN CLS APL DERMABOND .7 (GAUZE/BANDAGES/DRESSINGS) ×1
APL PRP STRL LF DISP 70% ISPRP (MISCELLANEOUS) ×1
BLADE SURG SZ11 CARB STEEL (BLADE) ×3 IMPLANT
CHLORAPREP W/TINT 26 (MISCELLANEOUS) ×3 IMPLANT
CLOTH BEACON ORANGE TIMEOUT ST (SAFETY) ×3 IMPLANT
COVER LIGHT HANDLE STERIS (MISCELLANEOUS) ×6 IMPLANT
DERMABOND ADVANCED (GAUZE/BANDAGES/DRESSINGS) ×1
DERMABOND ADVANCED .7 DNX12 (GAUZE/BANDAGES/DRESSINGS) ×2 IMPLANT
ELECT REM PT RETURN 9FT ADLT (ELECTROSURGICAL) ×2
ELECTRODE REM PT RTRN 9FT ADLT (ELECTROSURGICAL) ×2 IMPLANT
GAUZE 4X4 16PLY ~~LOC~~+RFID DBL (SPONGE) ×3 IMPLANT
GAUZE SPONGE 4X4 12PLY STRL (GAUZE/BANDAGES/DRESSINGS) ×1 IMPLANT
GLOVE BIOGEL PI IND STRL 7.0 (GLOVE) ×4 IMPLANT
GLOVE BIOGEL PI INDICATOR 7.0 (GLOVE) ×2
GLOVE SURG SS PI 7.5 STRL IVOR (GLOVE) ×3 IMPLANT
GOWN STRL REUS W/TWL LRG LVL3 (GOWN DISPOSABLE) ×9 IMPLANT
INST SET MINOR GENERAL (KITS) ×1 IMPLANT
KIT TURNOVER KIT A (KITS) ×3 IMPLANT
LIGASURE IMPACT 36 18CM CVD LR (INSTRUMENTS) IMPLANT
MANIFOLD NEPTUNE II (INSTRUMENTS) ×3 IMPLANT
MESH VENTRALEX ST 2.5 CRC MED (Mesh General) ×1 IMPLANT
NDL HYPO 21X1.5 SAFETY (NEEDLE) ×2 IMPLANT
NEEDLE HYPO 21X1.5 SAFETY (NEEDLE) ×2 IMPLANT
NS IRRIG 1000ML POUR BTL (IV SOLUTION) ×3 IMPLANT
PACK MAJOR ABDOMINAL (CUSTOM PROCEDURE TRAY) IMPLANT
PACK MINOR (CUSTOM PROCEDURE TRAY) ×1 IMPLANT
PAD ARMBOARD 7.5X6 YLW CONV (MISCELLANEOUS) ×3 IMPLANT
SET BASIN LINEN APH (SET/KITS/TRAYS/PACK) ×3 IMPLANT
SUT ETHIBOND 0 MO6 C/R (SUTURE) ×2 IMPLANT
SUT MNCRL AB 4-0 PS2 18 (SUTURE) ×3 IMPLANT
SUT NOVA NAB GS-22 2 2-0 T-19 (SUTURE) IMPLANT
SUT PROLENE 0 CT 1 CR/8 (SUTURE) IMPLANT
SUT SILK 2 0 (SUTURE)
SUT SILK 2-0 18XBRD TIE 12 (SUTURE) IMPLANT
SUT VIC AB 2-0 CT1 27 (SUTURE) ×2
SUT VIC AB 2-0 CT1 TAPERPNT 27 (SUTURE) ×2 IMPLANT
SUT VIC AB 3-0 SH 27 (SUTURE) ×4
SUT VIC AB 3-0 SH 27X BRD (SUTURE) ×2 IMPLANT
SUT VICRYL AB 2 0 TIES (SUTURE) IMPLANT
SYR 20ML LL LF (SYRINGE) ×6 IMPLANT
TAPE HYPAFIX 4 X30'CHARGABLE (GAUZE/BANDAGES/DRESSINGS) ×1 IMPLANT

## 2022-03-05 NOTE — Anesthesia Preprocedure Evaluation (Signed)
Anesthesia Evaluation  Patient identified by MRN, date of birth, ID band Patient awake    Reviewed: Allergy & Precautions, NPO status , Patient's Chart, lab work & pertinent test results  History of Anesthesia Complications (+) PONV and history of anesthetic complications  Airway Mallampati: II  TM Distance: >3 FB Neck ROM: Full    Dental  (+) Dental Advisory Given, Teeth Intact   Pulmonary neg pulmonary ROS,    Pulmonary exam normal breath sounds clear to auscultation       Cardiovascular Exercise Tolerance: Good hypertension, Pt. on medications + DVT  Normal cardiovascular exam Rhythm:Regular Rate:Normal     Neuro/Psych negative neurological ROS  negative psych ROS   GI/Hepatic Neg liver ROS, GERD  Medicated,  Endo/Other  negative endocrine ROS  Renal/GU negative Renal ROS  negative genitourinary   Musculoskeletal negative musculoskeletal ROS (+)   Abdominal   Peds negative pediatric ROS (+)  Hematology  (+) Blood dyscrasia, anemia , neg HIV,   Anesthesia Other Findings   Reproductive/Obstetrics negative OB ROS                             Anesthesia Physical  Anesthesia Plan  ASA: 2  Anesthesia Plan: General   Post-op Pain Management: Dilaudid IV and Regional block   Induction: Intravenous  PONV Risk Score and Plan: 4 or greater and Ondansetron, Dexamethasone and Midazolam  Airway Management Planned: Oral ETT  Additional Equipment:   Intra-op Plan:   Post-operative Plan: Extubation in OR  Informed Consent: I have reviewed the patients History and Physical, chart, labs and discussed the procedure including the risks, benefits and alternatives for the proposed anesthesia with the patient or authorized representative who has indicated his/her understanding and acceptance.     Dental advisory given  Plan Discussed with: CRNA and Surgeon  Anesthesia Plan Comments:          Anesthesia Quick Evaluation

## 2022-03-05 NOTE — Interval H&P Note (Signed)
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  The various methods of treatment have been discussed with the patient and family. After consideration of risks, benefits and other options for treatment, the patient has consented to  Procedure(s): HERNIA REPAIR INCISIONAL W/ MESH (N/A) as a surgical intervention.  The patient's history has been reviewed, patient examined, no change in status, stable for surgery.  I have reviewed the patient's chart and labs.  Questions were answered to the patient's satisfaction.     Aviva Signs

## 2022-03-05 NOTE — Transfer of Care (Signed)
Immediate Anesthesia Transfer of Care Note  Patient: Sarah Cross  Procedure(s) Performed: HERNIA REPAIR INCISIONAL W/ MESH  Patient Location: PACU  Anesthesia Type:General  Level of Consciousness: drowsy  Airway & Oxygen Therapy: Patient Spontanous Breathing and Patient connected to nasal cannula oxygen  Post-op Assessment: Report given to RN and Post -op Vital signs reviewed and stable  Post vital signs: Reviewed and stable  Last Vitals:  Vitals Value Taken Time  BP 133/75   Temp    Pulse 69   Resp 14   SpO2 97%     Last Pain:  Vitals:   03/05/22 1045  PainSc: 0-No pain         Complications: No notable events documented.

## 2022-03-05 NOTE — Anesthesia Postprocedure Evaluation (Signed)
Anesthesia Post Note  Patient: Sarah Cross  Procedure(s) Performed: HERNIA REPAIR INCISIONAL W/ MESH  Patient location during evaluation: Phase II Anesthesia Type: General Level of consciousness: awake and alert and oriented Pain management: pain level controlled Vital Signs Assessment: post-procedure vital signs reviewed and stable Respiratory status: spontaneous breathing and respiratory function stable Cardiovascular status: blood pressure returned to baseline and stable Postop Assessment: no apparent nausea or vomiting Anesthetic complications: no   No notable events documented.   Last Vitals:  Vitals:   03/05/22 1359 03/05/22 1400  BP: 140/79 140/79  Pulse:  67  Resp:  18  Temp:    SpO2:  100%    Last Pain:  Vitals:   03/05/22 1355  TempSrc: Oral  PainSc: 4                  Kayleigh Broadwell C Dora Simeone

## 2022-03-05 NOTE — Op Note (Signed)
Patient:  Sarah Cross  DOB:  Jun 12, 1981  MRN:  [id number]   Preop Diagnosis: Incisional hernia  Postop Diagnosis: Incisional hernia  Procedure: Incisional herniorrhaphy with mesh  Surgeon: Aviva Signs, MD  Anes: General endotracheal  Indications: Patient is a 41 year old black female who presents with an incisional hernia.  The risks and benefits of the procedure including bleeding, infection, mesh use, and the possibility of recurrence of the hernia were fully explained to the patient, who gave informed consent.  Procedure note: The patient was placed in the supine position.  After induction of general endotracheal anesthesia, the abdomen was prepped and draped using usual sterile technique with ChloraPrep.  Surgical site confirmation was performed.  An elliptical incision was made across the transverse scar from the previous surgery.  The dissection was taken down to the fascia.  The patient had a 2 cm fascial defect with an enlarged hernia sac.  The hernia sac was excised without difficulty.  I palpated the anterior abdominal wall and no other hernias were identified.  Care was taken to avoid the small bowel.  A 6.2 cm Bard Ventralax ST patch was placed and secured to the fascia using 0 Ethibond interrupted sutures.  The fascia was then closed transversely overlying the mesh using 0 Ethibond interrupted sutures.  The subcutaneous layer was reapproximated using 3-0 Vicryl interrupted suture.  Exparel was instilled into the surrounding wound.  The skin was closed using staples.  Betadine ointment and dry sterile dressings were applied.  All tape and needle counts were correct at the end of the procedure.  The patient was extubated in the operating room and transferred to PACU in stable condition.  Complications: None  EBL: Minimal  Specimen: None

## 2022-03-05 NOTE — Anesthesia Procedure Notes (Signed)
Procedure Name: Intubation Date/Time: 03/05/2022 12:22 PM Performed by: Karna Dupes, CRNA Pre-anesthesia Checklist: Patient identified, Emergency Drugs available, Suction available and Patient being monitored Patient Re-evaluated:Patient Re-evaluated prior to induction Oxygen Delivery Method: Circle system utilized Preoxygenation: Pre-oxygenation with 100% oxygen Induction Type: IV induction Ventilation: Mask ventilation without difficulty Laryngoscope Size: Mac and 3 Grade View: Grade II Tube type: Oral Tube size: 7.0 mm Number of attempts: 1 Airway Equipment and Method: Stylet Placement Confirmation: ETT inserted through vocal cords under direct vision, positive ETCO2 and breath sounds checked- equal and bilateral Secured at: 21 cm Tube secured with: Tape Dental Injury: Teeth and Oropharynx as per pre-operative assessment

## 2022-03-06 ENCOUNTER — Encounter (HOSPITAL_COMMUNITY): Payer: Self-pay | Admitting: General Surgery

## 2022-03-13 ENCOUNTER — Encounter: Payer: Self-pay | Admitting: General Surgery

## 2022-03-13 ENCOUNTER — Ambulatory Visit (INDEPENDENT_AMBULATORY_CARE_PROVIDER_SITE_OTHER): Payer: Medicaid Other | Admitting: General Surgery

## 2022-03-13 DIAGNOSIS — Z09 Encounter for follow-up examination after completed treatment for conditions other than malignant neoplasm: Secondary | ICD-10-CM

## 2022-03-13 NOTE — Progress Notes (Signed)
Subjective:     Sarah Cross  Virtual telephone postoperative visit performed with the patient.  She is doing well.  She has no complaints.  She does have staples in place. Objective:    LMP 02/17/2022   General:  no distress       Assessment:    Doing well postoperatively.    Plan:   Patient was instructed to follow-up in my office on 03/17/2022 for staple removal.  She will call to schedule the appointment.

## 2022-03-17 ENCOUNTER — Ambulatory Visit (INDEPENDENT_AMBULATORY_CARE_PROVIDER_SITE_OTHER): Payer: Medicaid Other | Admitting: General Surgery

## 2022-03-17 ENCOUNTER — Encounter: Payer: Self-pay | Admitting: General Surgery

## 2022-03-17 VITALS — BP 135/77 | HR 56 | Temp 98.2°F | Resp 14 | Ht 62.0 in | Wt 156.0 lb

## 2022-03-17 DIAGNOSIS — Z09 Encounter for follow-up examination after completed treatment for conditions other than malignant neoplasm: Secondary | ICD-10-CM

## 2022-03-17 MED ORDER — POLYETHYLENE GLYCOL 3350 17 G PO PACK: 17.0000 g | PACK | Freq: Two times a day (BID) | ORAL | 1 refills | Status: DC

## 2022-03-17 NOTE — Progress Notes (Signed)
Subjective:     Sarah Cross  Patient here for postoperative visit, status post incisional herniorrhaphy with mesh.  Patient states that she has been constipated.  She did have a bowel movement this morning.  She has had intermittent early satiety, but no emesis.  She is passing gas. Objective:    BP 135/77   Pulse (!) 56   Temp 98.2 F (36.8 C) (Oral)   Resp 14   Ht '5\' 2"'$  (1.575 m)   Wt 156 lb (70.8 kg)   LMP 02/17/2022   SpO2 97%   BMI 28.53 kg/m   General:  alert, cooperative, and no distress  Abdomen soft, incision healing well.  Staples removed, Steri-Strips applied.     Assessment:    Doing well postoperatively. Postoperative constipation    Plan:   I have started MiraLAX 17 g p.o. twice daily for the next 2 weeks.  She may decrease the dosing once her bowel movements become more regular.  She will call if she remains constipated.  She may take milk of magnesia as needed for severe constipation.  Follow-up here as needed.

## 2022-03-30 ENCOUNTER — Ambulatory Visit: Payer: Medicaid Other | Admitting: Adult Health

## 2022-03-30 ENCOUNTER — Encounter: Payer: Self-pay | Admitting: Adult Health

## 2022-03-30 VITALS — BP 130/84 | HR 66 | Ht 61.0 in | Wt 154.0 lb

## 2022-03-30 DIAGNOSIS — Z3041 Encounter for surveillance of contraceptive pills: Secondary | ICD-10-CM | POA: Diagnosis not present

## 2022-03-30 DIAGNOSIS — I10 Essential (primary) hypertension: Secondary | ICD-10-CM | POA: Diagnosis not present

## 2022-03-30 MED ORDER — NORETHINDRONE 0.35 MG PO TABS: 1.0000 | ORAL_TABLET | Freq: Every day | ORAL | 3 refills | Status: DC

## 2022-03-30 NOTE — Progress Notes (Signed)
  Subjective:     Patient ID: Sarah Cross, female   DOB: Jul 08, 1981, 41 y.o.   MRN: [id number]  HPI Lacye is a 41 year old black female, divorced, G2P1102 in for BP check and ROS. She had hernia surgery 2 weeks ago.  Lab Results  Component Value Date   DIAGPAP  10/01/2021    - Negative for intraepithelial lesion or malignancy (NILM)   HPV NOT DETECTED 08/05/2018   Babb Negative 10/01/2021   PCP is Dr Hilma Favors.  Review of Systems Doing well Denies headaches Reviewed past medical,surgical, social and family history. Reviewed medications and allergies.     Objective:   Physical Exam BP 130/84 (BP Location: Right Arm, Patient Position: Sitting, Cuff Size: Normal)   Pulse 66   Ht '5\' 1"'$  (1.549 m)   Wt 154 lb (69.9 kg)   LMP 03/16/2022   BMI 29.10 kg/m     Skin warm and dry. Lungs: clear to ausculation bilaterally. Cardiovascular: regular rate and rhythm.   Upstream - 03/30/22 1508       Pregnancy Intention Screening   Does the patient want to become pregnant in the next year? No    Does the patient's partner want to become pregnant in the next year? No    Would the patient like to discuss contraceptive options today? No      Contraception Wrap Up   Current Method Oral Contraceptive    End Method Oral Contraceptive    Contraception Counseling Provided No             Assessment:     1. Encounter for surveillance of contraceptive pills She says she needs refills on birth control pills  Will refill [facility] ordered this encounter  Medications   norethindrone (MICRONOR) 0.35 MG tablet    Sig: Take 1 tablet (0.35 mg total) by mouth daily.    Dispense:  84 tablet    Refill:  3    Order Specific Question:   Supervising Provider    Answer:   Elonda Husky, LUTHER H [[id number]]     2. Hypertension, unspecified type BP is better Has lost 6.5 lbs Will continue losartan 25 mg 1 daily,has refills     Plan:     Return about 10/08/22 for physical and BP check

## 2022-04-16 ENCOUNTER — Encounter: Payer: Self-pay | Admitting: *Deleted

## 2022-04-20 ENCOUNTER — Other Ambulatory Visit: Payer: Self-pay | Admitting: Adult Health

## 2022-04-23 ENCOUNTER — Other Ambulatory Visit: Payer: Self-pay | Admitting: Family Medicine

## 2022-04-23 ENCOUNTER — Other Ambulatory Visit: Payer: Self-pay

## 2022-04-23 ENCOUNTER — Encounter (HOSPITAL_COMMUNITY): Payer: Self-pay | Admitting: Emergency Medicine

## 2022-04-23 ENCOUNTER — Ambulatory Visit (HOSPITAL_COMMUNITY)
Admission: RE | Admit: 2022-04-23 | Discharge: 2022-04-23 | Disposition: A | Discharge status: Home / Self Care | Payer: Medicaid Other | Source: Ambulatory Visit | Attending: Family Medicine | Admitting: Family Medicine

## 2022-04-23 ENCOUNTER — Emergency Department (HOSPITAL_COMMUNITY): Payer: Medicaid Other

## 2022-04-23 ENCOUNTER — Emergency Department (HOSPITAL_COMMUNITY)
Admission: EM | Admit: 2022-04-23 | Discharge: 2022-04-23 | Disposition: A | Discharge status: Home / Self Care | Payer: Medicaid Other | Attending: Emergency Medicine | Admitting: Emergency Medicine

## 2022-04-23 ENCOUNTER — Other Ambulatory Visit (HOSPITAL_COMMUNITY): Payer: Self-pay | Admitting: Family Medicine

## 2022-04-23 DIAGNOSIS — Z91040 Latex allergy status: Secondary | ICD-10-CM | POA: Insufficient documentation

## 2022-04-23 DIAGNOSIS — M7989 Other specified soft tissue disorders: Secondary | ICD-10-CM | POA: Diagnosis present

## 2022-04-23 DIAGNOSIS — R600 Localized edema: Secondary | ICD-10-CM

## 2022-04-23 DIAGNOSIS — M79605 Pain in left leg: Secondary | ICD-10-CM | POA: Insufficient documentation

## 2022-04-23 DIAGNOSIS — Z79899 Other long term (current) drug therapy: Secondary | ICD-10-CM | POA: Diagnosis not present

## 2022-04-23 DIAGNOSIS — I82412 Acute embolism and thrombosis of left femoral vein: Secondary | ICD-10-CM | POA: Diagnosis not present

## 2022-04-23 DIAGNOSIS — Z7901 Long term (current) use of anticoagulants: Secondary | ICD-10-CM | POA: Diagnosis not present

## 2022-04-23 LAB — CBC WITH DIFFERENTIAL/PLATELET
Abs Immature Granulocytes: 0.02 10*3/uL (ref 0.00–0.07)
Basophils Absolute: 0.1 10*3/uL (ref 0.0–0.1)
Basophils Relative: 1 %
Eosinophils Absolute: 0.2 10*3/uL (ref 0.0–0.5)
Eosinophils Relative: 3 %
HCT: 34.5 % — ABNORMAL LOW (ref 36.0–46.0)
Hemoglobin: 11.1 g/dL — ABNORMAL LOW (ref 12.0–15.0)
Immature Granulocytes: 0 %
Lymphocytes Relative: 30 %
Lymphs Abs: 2.4 10*3/uL (ref 0.7–4.0)
MCH: 24.2 pg — ABNORMAL LOW (ref 26.0–34.0)
MCHC: 32.2 g/dL (ref 30.0–36.0)
MCV: 75.2 fL — ABNORMAL LOW (ref 80.0–100.0)
Monocytes Absolute: 0.5 10*3/uL (ref 0.1–1.0)
Monocytes Relative: 6 %
Neutro Abs: 5 10*3/uL (ref 1.7–7.7)
Neutrophils Relative %: 60 %
Platelets: 417 10*3/uL — ABNORMAL HIGH (ref 150–400)
RBC: 4.59 MIL/uL (ref 3.87–5.11)
RDW: 19.1 % — ABNORMAL HIGH (ref 11.5–15.5)
WBC: 8.2 10*3/uL (ref 4.0–10.5)
nRBC: 0 % (ref 0.0–0.2)

## 2022-04-23 LAB — COMPREHENSIVE METABOLIC PANEL
ALT: 15 U/L (ref 0–44)
AST: 21 U/L (ref 15–41)
Albumin: 5.2 g/dL — ABNORMAL HIGH (ref 3.5–5.0)
Alkaline Phosphatase: 77 U/L (ref 38–126)
Anion gap: 6 (ref 5–15)
BUN: 9 mg/dL (ref 6–20)
CO2: 25 mmol/L (ref 22–32)
Calcium: 9.4 mg/dL (ref 8.9–10.3)
Chloride: 108 mmol/L (ref 98–111)
Creatinine, Ser: 0.88 mg/dL (ref 0.44–1.00)
GFR, Estimated: >60 mL/min (ref >60)
Glucose, Bld: 88 mg/dL (ref 70–99)
Potassium: 3.1 mmol/L — ABNORMAL LOW (ref 3.5–5.1)
Sodium: 139 mmol/L (ref 135–145)
Total Bilirubin: 0.4 mg/dL (ref 0.3–1.2)
Total Protein: 9.2 g/dL — ABNORMAL HIGH (ref 6.5–8.1)

## 2022-04-23 LAB — I-STAT BETA HCG BLOOD, ED (MC, WL, AP ONLY): I-stat hCG, quantitative: <5 m[IU]/mL (ref <5)

## 2022-04-23 LAB — BRAIN NATRIURETIC PEPTIDE: B Natriuretic Peptide: 63 pg/mL (ref 0.0–100.0)

## 2022-04-23 LAB — TROPONIN I (HIGH SENSITIVITY)
Troponin I (High Sensitivity): <2 ng/L (ref <18)
Troponin I (High Sensitivity): <2 ng/L (ref <18)

## 2022-04-23 MED ORDER — MEDICAL COMPRESSION THIGH HIGH MISC
0 refills | Status: DC
Start: 2022-04-23 — End: 2024-05-11

## 2022-04-23 MED ORDER — SODIUM CHLORIDE 0.9 % IV BOLUS
1000.0000 mL | Freq: Once | INTRAVENOUS | Status: AC
  Administered 2022-04-23: 1000 mL via INTRAVENOUS

## 2022-04-23 MED ORDER — ONDANSETRON HCL 4 MG/2ML IJ SOLN
4.0000 mg | Freq: Once | INTRAMUSCULAR | Status: AC | PRN
  Administered 2022-04-23: 4 mg via INTRAVENOUS
  Filled 2022-04-23: qty 2

## 2022-04-23 MED ORDER — HEPARIN BOLUS VIA INFUSION
5000.0000 [IU] | Freq: Once | INTRAVENOUS | Status: AC
  Administered 2022-04-23: 5000 [IU] via INTRAVENOUS

## 2022-04-23 MED ORDER — APIXABAN (ELIQUIS) VTE STARTER PACK (10MG AND 5MG): ORAL_TABLET | ORAL | 0 refills | Status: DC

## 2022-04-23 MED ORDER — METOCLOPRAMIDE HCL 5 MG/ML IJ SOLN
10.0000 mg | Freq: Once | INTRAMUSCULAR | Status: AC | PRN
  Administered 2022-04-23: 10 mg via INTRAVENOUS
  Filled 2022-04-23: qty 2

## 2022-04-23 MED ORDER — HEPARIN SODIUM (PORCINE) 5000 UNIT/ML IJ SOLN: 5000.0000 [IU] | Freq: Once | INTRAMUSCULAR | Status: DC

## 2022-04-23 MED ORDER — APIXABAN (ELIQUIS) EDUCATION KIT FOR DVT/PE PATIENTS
PACK | Freq: Once | Status: AC
Start: 2022-04-23 — End: 2022-04-23

## 2022-04-23 MED ORDER — HEPARIN (PORCINE) 25000 UT/250ML-% IV SOLN
1050.0000 [IU]/h | INTRAVENOUS | Status: DC
  Administered 2022-04-23: 1050 [IU]/h via INTRAVENOUS
  Filled 2022-04-23: qty 250

## 2022-04-23 MED ORDER — APIXABAN 5 MG PO TABS
10.0000 mg | ORAL_TABLET | Freq: Once | ORAL | Status: AC
  Administered 2022-04-23: 10 mg via ORAL
  Filled 2022-04-23: qty 2

## 2022-04-23 MED ORDER — IOHEXOL 350 MG/ML SOLN
100.0000 mL | Freq: Once | INTRAVENOUS | Status: AC | PRN
Start: 2022-04-23 — End: 2022-04-23
  Administered 2022-04-23: 100 mL via INTRAVENOUS

## 2022-04-23 NOTE — Progress Notes (Signed)
ANTICOAGULATION CONSULT NOTE - Initial Consult  Pharmacy Consult for Heparin Indication: DVT  Allergies  Allergen Reactions   Ivp Dye [Iodinated Contrast Media] Nausea And Vomiting   Latex Itching and Other (See Comments)    Burns hands    Patient Measurements: Height: '5\' 2"'$  (157.5 cm) Weight: 70.3 kg (155 lb) IBW/kg (Calculated) : 50.1   Vital Signs: Temp: 97.7 F (36.5 C) (07/13 1741) Temp Source: Oral (07/13 1741) BP: 160/95 (07/13 1741) Pulse Rate: 81 (07/13 1741)  Labs: Recent Labs    04/23/22 1820  HGB 11.1*  HCT 34.5*  PLT 417*    CrCl cannot be calculated (Patient's most recent lab result is older than the maximum 21 days allowed.).   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  No anticoagulation prior to admission.   Pharmacy consulted to begin IV heparin drip  Labs in process   Goal of Therapy:  Heparin level 0.3-0.7 units/ml Monitor platelets by anticoagulation protocol: Yes   Plan:  Heparin bolus 5000 utsIV x1 then heparin drip 1050 uts/hr  Heparin level 6hr after start  Daily heparin level and CBC     Bonnita Nasuti Pharm.D. CPP, BCPS Clinical Pharmacist [phone] 04/23/2022 6:46 PM

## 2022-04-23 NOTE — Discharge Instructions (Addendum)
Your ultrasound shows an extensive DVT.  You need to follow-up with the vascular surgeon listed.  You need to apply a thigh-high compression stocking at 20-30 mmHg.  You are being started on a blood thinner called Eliquis.  Do not take NSAIDs such as ibuprofen, Advil, Aleve, etc.  Do not take aspirin.  You may take Tylenol for pain or discomfort  Return to the ER if you develop chest pain, shortness of breath, new or worsening pain, numbness in the leg, or any other new/concerning symptoms.  Eliquis does increase your chance of bleeding so if you notice uncontrolled bleeding or suffer an injury, especially to the head, then return to the ER for evaluation or call 911.

## 2022-04-23 NOTE — ED Notes (Signed)
Patient transported to CT 

## 2022-04-23 NOTE — ED Notes (Signed)
ED Provider at bedside. 

## 2022-04-23 NOTE — ED Notes (Signed)
Called [facility] for Eliquis education kit

## 2022-04-23 NOTE — ED Provider Notes (Signed)
[facility] EMERGENCY DEPARTMENT Provider Note   CSN: [id number] Arrival date & time: 04/23/22  1725     History  Chief Complaint  Patient presents with   Leg Swelling   dvt    Sarah Cross is a 41 y.o. female.  HPI 41 year old female presents with left leg swelling.  Was told she had a DVT based on ultrasound.  She started having left leg swelling yesterday.  It feels generally sore and painful to touch but otherwise not having severe pain.  Since this afternoon she has developed chest pressure though she is not sure if this because they told her she had a DVT.  She does have a remote history of a DVT but is not on anticoagulation.  She had surgery just over a month ago for a hernia.  No shortness of breath.  Of note, she has a IVP dye allergy listed that is nausea and vomiting but she states she has never had anything worse than nausea and vomiting and has had this twice.  Never had lip swelling, trouble breathing, or hives.  Home Medications Prior to Admission medications   Medication Sig Start Date End Date Taking? Authorizing Provider  acetaminophen (TYLENOL) 500 MG tablet Take 1,000 mg by mouth every 6 (six) hours as needed for moderate pain.   Yes [provider]  APIXABAN (ELIQUIS) VTE STARTER PACK (10MG AND 5MG) Take as directed on package: start with two-20m tablets twice daily for 7 days. On day 8, switch to one-538mtablet twice daily. 04/23/22  Yes GoSherwood GamblerMD  cyclobenzaprine (FLEXERIL) 10 MG tablet Take 10 mg by mouth 3 (three) times daily. 03/26/22  Yes [provider]  Elastic Bandages & Supports (MEDICAL COMPRESSION THIGH HIGH) MISC Thigh-high compression stocking to the left leg at 20-30 mmHg 04/23/22  Yes GoSherwood GamblerMD  HEATHER 0.35 MG tablet TAKE 1 TABLET(0.35 MG) BY MOUTH DAILY Patient taking differently: Take 1 tablet by mouth daily. 04/20/22  Yes GrDerrek Monaco, NP  ibuprofen (ADVIL) 800 MG tablet Take 800 mg by mouth 3  (three) times daily. 03/26/22  Yes [provider]  losartan (COZAAR) 25 MG tablet TAKE 1 TABLET(25 MG) BY MOUTH DAILY 10/01/21  Yes GrDerrek Monaco, NP  Multiple Vitamins-Minerals (HAIR SKIN AND NAILS FORMULA) TABS Take 1 tablet by mouth daily.   Yes [provider]  polyethylene glycol (MIRALAX / GLYCOLAX) 17 g packet Take 17 g by mouth 2 (two) times daily. 03/17/22  Yes JeAviva SignsMD  POTASSIUM PO Take 1 tablet by mouth daily.   Yes [provider]  alum & mag hydroxide-simeth (MAALOX PLUS) 400-400-40 MG/5ML suspension Take 15 mLs by mouth every 6 (six) hours as needed for indigestion. Patient not taking: Reported on 03/30/2022 03/03/22   Mesner, JaCorene CorneaMD  ondansetron (ZOFRAN) 4 MG tablet Take 1 tablet (4 mg total) by mouth every 8 (eight) hours as needed for nausea or vomiting. Patient not taking: Reported on 03/30/2022 03/03/22   Mesner, JaCorene CorneaMD  ondansetron (ZOFRAN-ODT) 4 MG disintegrating tablet 7m58mDT q4 hours prn nausea/vomit Patient not taking: Reported on 03/30/2022 03/03/22   Mesner, JasCorene CorneaD  oxyCODONE-acetaminophen (PERCOCET) 5-325 MG tablet Take 1 tablet by mouth every 4 (four) hours as needed for severe pain. Patient not taking: Reported on 03/30/2022 03/05/22 03/05/23  JenAviva SignsD      Allergies    Ivp dye [iodinated contrast media] and Latex    Review of Systems   Review of  Systems  Respiratory:  Negative for shortness of breath.   Cardiovascular:  Positive for chest pain and leg swelling.  Gastrointestinal:  Negative for abdominal pain.    Physical Exam Updated Vital Signs BP 136/84   Pulse (!) 56   Temp 97.7 F (36.5 C) (Oral)   Resp 10   Ht _0  (1.575 m)   Wt 70.3 kg   LMP 03/16/2022   SpO2 100%   BMI 28.35 kg/m  Physical Exam Vitals and nursing note reviewed.  Constitutional:      Appearance: She is well-developed.  HENT:     Head: Normocephalic and atraumatic.  Cardiovascular:     Rate and Rhythm: Normal rate and  regular rhythm.     Pulses:          Dorsalis pedis pulses are 2+ on the left side.     Heart sounds: Normal heart sounds.  Pulmonary:     Effort: Pulmonary effort is normal.     Breath sounds: Normal breath sounds.  Abdominal:     Palpations: Abdomen is soft.     Tenderness: There is no abdominal tenderness.  Musculoskeletal:     Left upper leg: No tenderness.     Left lower leg: Swelling and tenderness (mild) present.  Skin:    General: Skin is warm and dry.  Neurological:     Mental Status: She is alert.     ED Results / Procedures / Treatments   Labs (all labs ordered are listed, but only abnormal results are displayed) Labs Reviewed  CBC WITH DIFFERENTIAL/PLATELET - Abnormal; Notable for the following components:      Result Value   Hemoglobin 11.1 (*)    HCT 34.5 (*)    MCV 75.2 (*)    MCH 24.2 (*)    RDW 19.1 (*)    Platelets 417 (*)    All other components within normal limits  COMPREHENSIVE METABOLIC PANEL - Abnormal; Notable for the following components:   Potassium 3.1 (*)    Total Protein 9.2 (*)    Albumin 5.2 (*)    All other components within normal limits  BRAIN NATRIURETIC PEPTIDE  HEPARIN LEVEL (UNFRACTIONATED)  CBC  I-STAT BETA HCG BLOOD, ED ([facility], [facility], [facility] ONLY)  TROPONIN I (HIGH SENSITIVITY)  TROPONIN I (HIGH SENSITIVITY)    EKG EKG Interpretation  Date/Time:  Thursday April 23 2022 19:23:29 EDT Ventricular Rate:  84 PR Interval:  141 QRS Duration: 95 QT Interval:  368 QTC Calculation: 435 R Axis:   79 Text Interpretation: Sinus rhythm nonspecific T wave flattening diffusely Confirmed by Sherwood Gambler [phone]) on 04/23/2022 7:28:23 PM  Radiology CT VENOGRAM ABD/PEL  Result Date: 04/23/2022 CLINICAL DATA:  LEFT LOWER EXTREMITY DVT EXAM: CT VENOGRAM ABDOMEN AND PELVIS WITH CONTRAST TECHNIQUE: Multidetector CT imaging of the abdomen and pelvis was performed using the standard protocol following bolus administration of intravenous contrast.  RADIATION DOSE REDUCTION: This exam was performed according to the departmental dose-optimization program which includes automated exposure control, adjustment of the mA and/or kV according to patient size and/or use of iterative reconstruction technique. CONTRAST:  120m OMNIPAQUE IOHEXOL 350 MG/ML SOLN COMPARISON:  02/19/2022 FINDINGS: Lower chest: No acute abnormality. Hepatobiliary: Stable subcentimeter hypodensities in the liver most compatible with cysts. No suspicious focal hepatic abnormality. Gallbladder unremarkable. Pancreas: No focal abnormality or ductal dilatation. Spleen: No focal abnormality.  Normal size. Adrenals/Urinary Tract: No suspicious renal or adrenal mass. No stone or hydronephrosis. Urinary bladder unremarkable. Stomach/Bowel: Stomach, large and  small bowel grossly unremarkable. Vascular/Lymphatic: Aorta normal caliber. No adenopathy. 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 This is unchanged since prior study. Reproductive: Enlarged uterus with numerous fibroids, the largest centrally measuring 3.8 cm. Other: No free fluid or free air. 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 Musculoskeletal: No acute bony abnormality. 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 This is unchanged since prior study. No acute findings in the abdomen or pelvis. Electronically Signed   By: Rolm Baptise M.D.   On: 04/23/2022 20:11   CT Angio Chest PE W and/or Wo Contrast  Result Date: 04/23/2022 CLINICAL DATA:  Left leg swelling. Pulmonary embolism (PE) suspected, high prob chest EXAM: CT ANGIOGRAPHY CHEST WITH CONTRAST TECHNIQUE: Multidetector CT imaging of the chest was performed using the standard protocol  during bolus administration of intravenous contrast. Multiplanar CT image reconstructions and MIPs were obtained to evaluate the vascular anatomy. RADIATION DOSE REDUCTION: This exam was performed according to the departmental dose-optimization program which includes automated exposure control, adjustment of the mA and/or kV according to patient size and/or use of iterative reconstruction technique. CONTRAST:  141m OMNIPAQUE IOHEXOL 350 MG/ML SOLN COMPARISON:  10/03/2013 FINDINGS: Cardiovascular: No filling defects in the pulmonary arteries to suggest pulmonary emboli. Heart is normal size. Aorta is normal caliber. Mediastinum/Nodes: No mediastinal, hilar, or axillary adenopathy. Trachea and esophagus are unremarkable. Thyroid unremarkable. Lungs/Pleura: Lungs are clear. No focal airspace opacities or suspicious nodules. No effusions. Upper Abdomen: See abdominal CT report Musculoskeletal: Chest wall soft tissues are unremarkable. No acute bony abnormality. Review of the MIP images confirms the above findings. IMPRESSION: No evidence of pulmonary embolus. No acute cardiopulmonary disease. Electronically Signed   By: KRolm BaptiseM.D.   On: 04/23/2022 20:02   [place]Venous Img Lower Unilateral Left (DVT)  Result Date: 04/23/2022 CLINICAL DATA:  LEFT lower extremity edema and pain x2 days. EXAM: LEFT LOWER EXTREMITY VENOUS DOPPLER ULTRASOUND TECHNIQUE: Gray-scale sonography with compression, as well as color and duplex ultrasound, were performed to evaluate the deep venous system(s) from the level of the common femoral vein through the popliteal and proximal calf veins. COMPARISON:  CT [facility], 02/19/2022.  Lower extremity duplex, 12/12/2014. FINDINGS: VENOUS Heterogeneously echogenic filling defect within the imaged proximal LEFT common femoral, superficial femoral and popliteal veins. The thrombus is partially occlusive centrally and occlusive peripherally. Additional filling defects within the visualized portions of  the calf veins including 1 of the paired peroneal veins. Limited views of the contralateral common femoral vein are unremarkable. OTHER No evidence of superficial thrombophlebitis or abnormal fluid collection. Limitations: none IMPRESSION: Acute extensive LEFT lower extremity femoropopliteal DVT, as described above. The central extent of thrombus is not characterized on this study. Recommend CT venogram abdomen and pelvis to determine degree of iliocaval thrombus burden and identify possible underlying etiologies (e.g. May-Thurner). Consultation by Interventional Radiology or other qualified endovascular specialist may be warranted. These results will be called to the ordering clinician or representative by the Radiologist Assistant, and communication documented in the PACS or [employer] JMichaelle Birks MD [facility] Electronically Signed   By: JMichaelle BirksM.D.   On: 04/23/2022 17:18    Procedures Procedures    Medications Ordered in ED Medications  sodium chloride 0.9 %  bolus 1,000 mL (0 mLs Intravenous Stopped 04/23/22 1940)  ondansetron (ZOFRAN) injection 4 mg (4 mg Intravenous Given 04/23/22 1920)  metoCLOPramide (REGLAN) injection 10 mg (10 mg Intravenous Given 04/23/22 1920)  heparin bolus via infusion 5,000 Units (5,000 Units Intravenous Bolus from Bag 04/23/22 1928)  iohexol (OMNIPAQUE) 350 MG/ML injection 100 mL (100 mLs Intravenous Contrast Given 04/23/22 1942)  apixaban Arne [place]) Education Kit for DVT/PE patients ( Does not apply Given 04/23/22 2335)  apixaban (ELIQUIS) tablet 10 mg (10 mg Oral Given 04/23/22 2316)    ED Course/ Medical Decision Making/ A&P                           Medical Decision Making Amount and/or Complexity of Data Reviewed Labs: ordered.    Details: Normal troponin x2.  Chronic anemia.  Chronic mild hypokalemia Radiology: ordered and independent interpretation performed.    Details: CTA without  PE  Risk Prescription drug management.   Patient CTA does not show PE.  She is hemodynamically stable.  She was initially put on IV heparin given high concern for a progressive clot going into her pelvis.  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  I discussed case with Dr. Virl Cagey and at this point she can be discharged with Eliquis and to follow-up with their office.  He recommends thigh-high compression stocking at 20-30 mmHg.  Patient's pain is well controlled and has requested no pain medicine.  She has mild soreness but is neurovascular intact and has soft compartments.  Low suspicion for compartment syndrome.  Will discharge home with return precautions.  Started on Eliquis and we discussed no NSAIDs or aspirin.        Final Clinical Impression(s) / ED Diagnoses Final diagnoses:  Acute deep vein thrombosis (DVT) of femoral vein of left lower extremity ([place])    Rx / DC Orders ED Discharge Orders          Ordered    APIXABAN (ELIQUIS) VTE STARTER PACK (10MG AND 5MG)        04/23/22 2250    Compression stockings       Comments: 20-30 mmhg   04/23/22 2250    Elastic Bandages & Supports (MEDICAL COMPRESSION THIGH HIGH) MISC        04/23/22 2310              Sherwood Gambler, MD 04/23/22 2350

## 2022-04-23 NOTE — ED Triage Notes (Signed)
Pt sent to ER after [place] of left leg showing extensive DVT.

## 2022-04-24 ENCOUNTER — Encounter (HOSPITAL_COMMUNITY): Payer: Self-pay | Admitting: Pharmacist

## 2022-04-28 ENCOUNTER — Telehealth: Payer: Self-pay | Admitting: Vascular Surgery

## 2022-05-18 NOTE — Telephone Encounter (Signed)
-----   Message -----  From: Broadus John, MD  Sent: 04/24/2022   8:01 AM EDT  To: Hessie Dibble, RN; [facility]   Please schedule follow up with me Next Friday 7/21, no imaging  DVT dx    Called patient No answer, went to VM.

## 2022-06-10 NOTE — Telephone Encounter (Signed)
Appointment was scheduled on 06/10/2022

## 2022-06-10 NOTE — Telephone Encounter (Signed)
Left 3rd VM for post op appointment to be scheduled

## 2022-06-25 NOTE — Progress Notes (Unsigned)
Office Note     CC: Left lower extremity femoral-popliteal DVT Requesting Provider:  Sharilyn Sites, MD  HPI: Sarah Cross is a 41 y.o. (Jan 23, 1981) female who presents at the request of Sharilyn Sites, MD for evaluation of lower extremity DVT.  Patient was seen in the ED in July of this year with acute onset left leg swelling.  At the time, she was status post hernia repair roughly 1 month prior.  She noted a remote history of DVT, but was not on anticoagulation at the time.  No PE was appreciated on CT chest.  On exam today, Sarah Cross was doing well.  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  Is a [age]-year-old.  This 41 year old will be going to [employer] next year with plans to study law. From a left lower extremity standpoint, Sarah Cross has been doing much better, the asymmetric swelling has subsided.  She denies heaviness, tired feeling by days end.  No bleeding or ulceration.  She continues to to be compliant with her anticoagulation.  Sarah Cross wears compression stockings, thigh-high, on a daily basis. She has appreciated some bilateral hip pain with some paresthesias in the toes, was diagnosed with sciatica.  Denies symptoms associated with claudication, ischemic rest pain, tissue loss.  Prior history includes previous provoked DVT status post surgery roughly 15 years ago.  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   Hydrocephalus in newborn [facility])    Hypertension    Nausea 12/26/2014   PONV (postoperative nausea and vomiting)    Pregnant 12/26/2014   Sickle cell anemia ([place])    has trait   Spotting during pregnancy in first trimester 12/26/2014   Vaginal Pap smear, abnormal     Past Surgical History:  Procedure Laterality Date    BLADDER TUMOR EXCISION     CESAREAN SECTION     CESAREAN SECTION N/A 07/20/2015   Procedure: REPEAT CESAREAN SECTION;  Surgeon: Jonnie Kind, MD;  Location: [facility];  Service: Obstetrics;  Laterality: N/A;   CSF SHUNT     INCISIONAL HERNIA REPAIR N/A 03/05/2022   Procedure: HERNIA REPAIR INCISIONAL W/ MESH;  Surgeon: Aviva Signs, MD;  Location: [facility];  Service: General;  Laterality: N/A;   MASS EXCISION N/A 07/04/2021   Procedure: EXCISION MASS; ABDOMEN;  Surgeon: Aviva Signs, MD;  Location: [facility];  Service: General;  Laterality: N/A;   SHOULDER ARTHROSCOPY WITH LABRAL REPAIR Right 10/02/2021   Procedure: SHOULDER ARTHROSCOPY WITH LABRAL REPAIR, BICEPS TENOTOMY;  Surgeon: Mordecai Rasmussen, MD;  Location: [facility];  Service: Orthopedics;  Laterality: Right;    Social History   Socioeconomic History   Marital status: Divorced    Spouse name: Not on file   Number of children: Not on file   Years of education: Not on file   Highest education level: Not on file  Occupational History   Not on file  Tobacco Use   Smoking status: Never   Smokeless tobacco: Never  Vaping Use   Vaping Use: Never used  Substance and Sexual Activity   Alcohol use: No   Drug use: No   Sexual activity: Not Currently    Birth control/protection: Pill  Other Topics Concern   Not on file  Social History Narrative   Not on file   Social Determinants of Health   Financial Resource Strain: Medium Risk (10/01/2021)   Overall Financial Resource Strain (CARDIA)    Difficulty of Paying Living Expenses: Somewhat hard  Food Insecurity: No Food Insecurity (10/01/2021)   Hunger Vital Sign    Worried About Running Out of Food in the Last Year: Never true    Ran Out of Food in the Last Year: Never true  Transportation Needs: No Transportation Needs (10/01/2021)   PRAPARE - Hydrologist (Medical): No    Lack of Transportation (Non-Medical): No  Physical Activity: Sufficiently  Active (10/01/2021)   Exercise Vital Sign    Days of Exercise per Week: 5 days    Minutes of Exercise per Session: 60 min  Stress: No Stress Concern Present (10/01/2021)   [place]    Feeling of Stress : Not at all  Social Connections: Moderately Integrated (10/01/2021)   Social Connection and Isolation Panel [NHANES]    Frequency of Communication with Friends and Family: More than three times a week    Frequency of Social Gatherings with Friends and Family: More than three times a week    Attends Religious Services: More than 4 times per year    Active Member of [employer] or Organizations: Yes    Attends Archivist Meetings: 1 to 4 times per year    Marital Status: Divorced  Intimate Partner Violence: Not At Risk (10/01/2021)   Humiliation, Afraid, Rape, and Kick questionnaire    Fear of Current or Ex-Partner: No    Emotionally Abused: No    Physically Abused: No    Sexually Abused: No   Family History  Problem Relation Age of Onset   Diabetes Mother    Cancer Mother        lung   Hypertension Father    Heart disease Maternal Grandmother    Diabetes Maternal Grandmother    Heart disease Paternal Grandmother    Diabetes Paternal Grandmother    Cancer Paternal Grandfather    Diabetes Sister    Heart disease Sister    Thyroid disease Sister    Diabetes Sister     Current Outpatient Medications  Medication Sig Dispense Refill   acetaminophen (TYLENOL) 500 MG tablet Take 1,000 mg by mouth every 6 (six) hours as needed for moderate pain.     alum & mag hydroxide-simeth (MAALOX PLUS) 400-400-40 MG/5ML suspension Take 15 mLs by mouth every 6 (six) hours as needed for indigestion. (Patient not taking: Reported on 03/30/2022) 355 mL 0   APIXABAN (ELIQUIS) VTE STARTER PACK ('10MG'$  AND '5MG'$ ) Take as directed on package: start with two-'5mg'$  tablets twice daily for 7 days. On day 8, switch to one-'5mg'$  tablet twice  daily. 1 each 0   cyclobenzaprine (FLEXERIL) 10 MG tablet Take 10 mg by mouth 3 (three) times daily.     Elastic Bandages & Supports (MEDICAL COMPRESSION THIGH HIGH) MISC Thigh-high compression stocking to the left leg at 20-30 mmHg 1 each 0   Sarah Cross 0.35 MG tablet TAKE 1 TABLET(0.35 MG) BY MOUTH DAILY (Patient taking differently: Take 1 tablet by mouth daily.) 28 tablet 6   ibuprofen (ADVIL) 800 MG tablet Take 800 mg by mouth 3 (three) times daily.     losartan (COZAAR) 25 MG tablet TAKE 1 TABLET(25 MG) BY MOUTH DAILY 90 tablet 4  Multiple Vitamins-Minerals (HAIR SKIN AND NAILS FORMULA) TABS Take 1 tablet by mouth daily.     ondansetron (ZOFRAN) 4 MG tablet Take 1 tablet (4 mg total) by mouth every 8 (eight) hours as needed for nausea or vomiting. (Patient not taking: Reported on 03/30/2022) 4 tablet 0   ondansetron (ZOFRAN-ODT) 4 MG disintegrating tablet '4mg'$  ODT q4 hours prn nausea/vomit (Patient not taking: Reported on 03/30/2022) 30 tablet 0   oxyCODONE-acetaminophen (PERCOCET) 5-325 MG tablet Take 1 tablet by mouth every 4 (four) hours as needed for severe pain. (Patient not taking: Reported on 03/30/2022) 30 tablet 0   polyethylene glycol (MIRALAX / GLYCOLAX) 17 g packet Take 17 g by mouth 2 (two) times daily. 28 each 1   POTASSIUM PO Take 1 tablet by mouth daily.     No current facility-administered medications for this visit.    Allergies  Allergen Reactions   Ivp Dye [Iodinated Contrast Media] Nausea And Vomiting   Latex Itching and Other (See Comments)    Burns hands     REVIEW OF SYSTEMS:   '[X]'$  denotes positive finding, '[ ]'$  denotes negative finding Cardiac  Comments:  Chest pain or chest pressure:    Shortness of breath upon exertion:    Short of breath when lying flat:    Irregular heart rhythm:        Vascular    Pain in calf, thigh, or hip brought on by ambulation:    Pain in feet at night that wakes you up from your sleep:     Blood clot in your veins: X   Leg  swelling:         Pulmonary    Oxygen at home:    Productive cough:     Wheezing:         Neurologic    Sudden weakness in arms or legs:     Sudden numbness in arms or legs:     Sudden onset of difficulty speaking or slurred speech:    Temporary loss of vision in one eye:     Problems with dizziness:         Gastrointestinal    Blood in stool:     Vomited blood:         Genitourinary    Burning when urinating:     Blood in urine:        Psychiatric    Major depression:         Hematologic    Bleeding problems:    Problems with blood clotting too easily:        Skin    Rashes or ulcers:        Constitutional    Fever or chills:      PHYSICAL EXAMINATION:  There were no vitals filed for this visit.  General:  WDWN in NAD; vital signs documented above Gait: Not observed HENT: WNL, normocephalic Pulmonary: normal non-labored breathing Cardiac: regular Abdomen: soft, NT, no masses Skin: without rashes Vascular Exam/Pulses:  Right Left  Radial 2+ (normal) 2+ (normal)  Ulnar    Femoral    Popliteal    DP 2+ (normal) 2+ (normal)  PT     Extremities: without ischemic changes, without Gangrene , without cellulitis; without open wounds;  Musculoskeletal: no muscle wasting or atrophy  Neurologic: A&O X 3;  No focal weakness or paresthesias are detected Psychiatric:  The pt has Normal affect.   Non-Invasive Vascular Imaging:    Vascular/Lymphatic: Aorta normal caliber. No adenopathy.  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 This is unchanged since prior study.  IMPRESSION: Acute extensive LEFT lower extremity femoropopliteal DVT, as described above.    ASSESSMENT/PLAN:: 41 y.o. female presenting for left lower extremity DVT which was appreciated 2 months ago.  No new imaging in clinic today.  Previous ultrasound reviewed  demonstrating nonocclusive DVT in the proximal femoral vein, occlusive in the distal femoral vein, popliteal vein.  Although this could be considered provoked from recent hernia surgery, this is her second DVT.  In the setting of multiple thrombotic episodes, she would be best treated with lifelong anticoagulation.  Had a long conversation with [place] regarding the above.  My plan is to see her back in 1 month's time for repeat duplex ultrasound to assess the thrombotic burden.  I commended her for continuing to wear the thigh-high compression stockings as this will provide significant benefit in the years to come and reduce symptoms associated with postphlebitic syndrome.   Broadus John, MD [facility] [phone]

## 2022-06-26 ENCOUNTER — Ambulatory Visit: Payer: Medicaid Other | Admitting: Vascular Surgery

## 2022-06-26 ENCOUNTER — Encounter: Payer: Self-pay | Admitting: Vascular Surgery

## 2022-06-26 VITALS — BP 162/95 | HR 66 | Temp 98.2°F | Resp 20 | Ht 62.0 in | Wt 155.0 lb

## 2022-06-26 DIAGNOSIS — I82412 Acute embolism and thrombosis of left femoral vein: Secondary | ICD-10-CM | POA: Diagnosis not present

## 2022-06-29 ENCOUNTER — Other Ambulatory Visit: Payer: Self-pay

## 2022-06-29 DIAGNOSIS — I82412 Acute embolism and thrombosis of left femoral vein: Secondary | ICD-10-CM

## 2022-07-31 ENCOUNTER — Ambulatory Visit: Payer: Medicaid Other

## 2022-07-31 ENCOUNTER — Encounter (HOSPITAL_COMMUNITY): Payer: Medicaid Other

## 2022-08-05 NOTE — Progress Notes (Signed)
[facility] [place]     History of Present Illness  Sarah Cross is a 41 y.o. female who presents for follow up exam and duplex after recurrent  left lower extremity DVT.   Previous ultrasound reviewed demonstrating nonocclusive DVT in the proximal femoral vein, occlusive in the distal femoral vein, popliteal vein.   She is here today for repeat DVT study  to assess the thrombotic burden.    She has a history of previous provoked DVT status post surgery roughly 15 years ago and is on lifelong anticoagulation.  She has had 4 different DVT occurrences the first documented in 2008.  She has continued to  wear the thigh-high compression stockings and elevation when at rest.  She is medically ,managed on Eliquis 5 mg BID.     She denies claudication, rest pain and non healing wounds.     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   Hydrocephalus in newborn [facility])    Hypertension    Nausea 12/26/2014   PONV (postoperative nausea and vomiting)    Pregnant 12/26/2014   Sickle cell anemia ([place])    has trait   Spotting during pregnancy in first trimester 12/26/2014   Vaginal Pap smear, abnormal     Past Surgical History:  Procedure Laterality Date   BLADDER TUMOR EXCISION     CESAREAN SECTION     CESAREAN SECTION N/A 07/20/2015   Procedure: REPEAT CESAREAN SECTION;  Surgeon: Jonnie Kind, MD;  Location: [facility];  Service: Obstetrics;  Laterality: N/A;   CSF SHUNT     INCISIONAL HERNIA REPAIR N/A 03/05/2022   Procedure: HERNIA REPAIR INCISIONAL W/ MESH;  Surgeon: Aviva Signs, MD;  Location: [facility];  Service: General;  Laterality: N/A;   MASS EXCISION N/A 07/04/2021   Procedure: EXCISION MASS; ABDOMEN;  Surgeon: Aviva Signs, MD;   Location: [facility];  Service: General;  Laterality: N/A;   SHOULDER ARTHROSCOPY WITH LABRAL REPAIR Right 10/02/2021   Procedure: SHOULDER ARTHROSCOPY WITH LABRAL REPAIR, BICEPS TENOTOMY;  Surgeon: Mordecai Rasmussen, MD;  Location: [facility];  Service: Orthopedics;  Laterality: Right;    Social History   Socioeconomic History   Marital status: Divorced    Spouse name: Not on file   Number of children: Not on file   Years of education: Not on file   Highest education level: Not on file  Occupational History   Not on file  Tobacco Use   Smoking status: Never    Passive exposure: Never   Smokeless tobacco: Never  Vaping Use   Vaping Use: Never used  Substance and Sexual Activity   Alcohol use: No   Drug use: No   Sexual activity: Not Currently    Birth control/protection: Pill  Other Topics Concern   Not on file  Social History Narrative   Not on file   Social Determinants of Health   Financial Resource Strain: Medium Risk (10/01/2021)   Overall Financial Resource Strain (CARDIA)    Difficulty of Paying Living Expenses: Somewhat hard  Food Insecurity: No Food Insecurity (10/01/2021)   Hunger Vital Sign    Worried About Running Out of Food in the Last Year: Never true  Ran Out of Food in the Last Year: Never true  Transportation Needs: No Transportation Needs (10/01/2021)   PRAPARE - Hydrologist (Medical): No    Lack of Transportation (Non-Medical): No  Physical Activity: Sufficiently Active (10/01/2021)   Exercise Vital Sign    Days of Exercise per Week: 5 days    Minutes of Exercise per Session: 60 min  Stress: No Stress Concern Present (10/01/2021)   [place]    Feeling of Stress : Not at all  Social Connections: Moderately Integrated (10/01/2021)   Social Connection and Isolation Panel [NHANES]    Frequency of Communication with Friends and Family: More than three times a  week    Frequency of Social Gatherings with Friends and Family: More than three times a week    Attends Religious Services: More than 4 times per year    Active Member of [employer] or Organizations: Yes    Attends Archivist Meetings: 1 to 4 times per year    Marital Status: Divorced  Intimate Partner Violence: Not At Risk (10/01/2021)   Humiliation, Afraid, Rape, and Kick questionnaire    Fear of Current or Ex-Partner: No    Emotionally Abused: No    Physically Abused: No    Sexually Abused: No    Family History  Problem Relation Age of Onset   Diabetes Mother    Cancer Mother        lung   Hypertension Father    Heart disease Maternal Grandmother    Diabetes Maternal Grandmother    Heart disease Paternal Grandmother    Diabetes Paternal Grandmother    Cancer Paternal Grandfather    Diabetes Sister    Heart disease Sister    Thyroid disease Sister    Diabetes Sister     Current Outpatient Medications on File Prior to Visit  Medication Sig Dispense Refill   acetaminophen (TYLENOL) 500 MG tablet Take 1,000 mg by mouth every 6 (six) hours as needed for moderate pain.     Elastic Bandages & Supports (MEDICAL COMPRESSION THIGH HIGH) MISC Thigh-high compression stocking to the left leg at 20-30 mmHg 1 each 0   ELIQUIS 5 MG TABS tablet Take 5 mg by mouth 2 (two) times daily.     losartan (COZAAR) 25 MG tablet TAKE 1 TABLET(25 MG) BY MOUTH DAILY 90 tablet 4   Multiple Vitamins-Minerals (HAIR SKIN AND NAILS FORMULA) TABS Take 1 tablet by mouth daily.     polyethylene glycol (MIRALAX / GLYCOLAX) 17 g packet Take 17 g by mouth 2 (two) times daily. 28 each 1   POTASSIUM PO Take 1 tablet by mouth daily.     alum & mag hydroxide-simeth (MAALOX PLUS) 400-400-40 MG/5ML suspension Take 15 mLs by mouth every 6 (six) hours as needed for indigestion. (Patient not taking: Reported on 03/30/2022) 355 mL 0   cyclobenzaprine (FLEXERIL) 10 MG tablet Take 10 mg by mouth 3 (three) times daily.      ibuprofen (ADVIL) 800 MG tablet Take 800 mg by mouth 3 (three) times daily. (Patient not taking: Reported on 06/26/2022)     ondansetron (ZOFRAN) 4 MG tablet Take 1 tablet (4 mg total) by mouth every 8 (eight) hours as needed for nausea or vomiting. (Patient not taking: Reported on 03/30/2022) 4 tablet 0   ondansetron (ZOFRAN-ODT) 4 MG disintegrating tablet '4mg'$  ODT q4 hours prn nausea/vomit (Patient not taking: Reported on 03/30/2022) 30 tablet 0  oxyCODONE-acetaminophen (PERCOCET) 5-325 MG tablet Take 1 tablet by mouth every 4 (four) hours as needed for severe pain. (Patient not taking: Reported on 03/30/2022) 30 tablet 0   No current facility-administered medications on file prior to visit.    Allergies as of 08/07/2022 - Review Complete 08/07/2022  Allergen Reaction Noted   Ivp dye [iodinated contrast media] Nausea And Vomiting 07/02/2021   Latex Itching and Other (See Comments) 09/20/2011     ROS:   General:  No weight loss, Fever, chills  HEENT: No recent headaches, no nasal bleeding, no visual changes, no sore throat  Neurologic: No dizziness, blackouts, seizures. No recent symptoms of stroke or mini- stroke. No recent episodes of slurred speech, or temporary blindness.  Cardiac: No recent episodes of chest pain/pressure, no shortness of breath at rest.  No shortness of breath with exertion.  Denies history of atrial fibrillation or irregular heartbeat  Vascular: No history of rest pain in feet.  No history of claudication.  No history of non-healing ulcer, positive history of DVT   Pulmonary: No home oxygen, no productive cough, no hemoptysis,  No asthma or wheezing  Musculoskeletal:  '[ ]'$  Arthritis, '[ ]'$  Low back pain,  '[ ]'$  Joint pain  Hematologic:No history of hypercoagulable state.  No history of easy bleeding.  No history of anemia  Gastrointestinal: No hematochezia or melena,  No gastroesophageal reflux, no trouble swallowing  Urinary: '[ ]'$  chronic Kidney disease, '[ ]'$  on  HD - '[ ]'$  MWF or '[ ]'$  TTHS, '[ ]'$  Burning with urination, '[ ]'$  Frequent urination, '[ ]'$  Difficulty urinating;   Skin: No rashes  Psychological: No history of anxiety,  No history of depression  Physical Examination  Vitals:   08/07/22 1233  BP: (!) 147/84  Pulse: 86  Resp: 20  Temp: 98.6 F (37 C)  TempSrc: Temporal  SpO2: 100%  Weight: 154 lb 8 oz (70.1 kg)  Height: '5\' 2"'$  (1.575 m)    Body mass index is 28.26 kg/m.  General:  Alert and oriented, no acute distress HEENT: Normal Neck: No bruit or JVD Pulmonary: Clear to auscultation bilaterally Cardiac: Regular Rate and Rhythm without murmur Abdomen: Soft, non-tender, non-distended, no mass, no scars Skin: No rash Extremity Pulses:  2+ radial, brachial, femoral, dorsalis pedis, posterior tibial pulses bilaterally Musculoskeletal: No deformity or edema  Neurologic: Upper and lower extremity motor 5/5 and symmetric  DATA:  +---------+---------------+---------+-----------+----------+--------------+   LEFT     CompressibilityPhasicitySpontaneityPropertiesThrombus  Aging  +---------+---------------+---------+-----------+----------+--------------+   CFV      Partial                                      Chronic          +---------+---------------+---------+-----------+----------+--------------+   FV Prox  Partial                                      Chronic          +---------+---------------+---------+-----------+----------+--------------+   FV Mid   Partial                                      Chronic          +---------+---------------+---------+-----------+----------+--------------+   FV DistalPartial  Chronic          +---------+---------------+---------+-----------+----------+--------------+   POP      Full                                                            +---------+---------------+---------+-----------+----------+--------------+   PTV      Full                                                           +---------+---------------+---------+-----------+----------+--------------+   GSV      Full                                                           +---------+---------------+---------+-----------+----------+--------------+   SSV      Full                                                           +---------+---------------+---------+-----------+----------+--------------+       Left Technical Findings:  Not visualized segments include peroneal veins.     Summary:   LEFT:  - Findings consistent with chronic non-occlusive deep vein thrombosis  involving the left common femoral vein and left proximal mid and distal  femoral vein.  -The popliteal vein is patent and compressible.      Assessment/Plan: 41 y.o. female presenting for left lower extremity DVT which was appreciated 2 months ago.  Recent DVT study 7/13/123 revealed Acute extensive LEFT lower extremity femoropopliteal DVT.  She is medically managed on Eliquis, compression and exercise as tolerates.  He DVT study today revealed chronic non-occlusive deep vein thrombosis  involving the left common femoral vein and left proximal mid and distal  femoral vein.  -The popliteal vein is patent and compressible.     Although this could be considered provoked from recent hernia surgery.  In the setting of multiple thrombotic episodes, she would be best treated with lifelong anticoagulation. I have sent a referral for Hematology to work her up.    She will f/u with [facility] PR[facility].   I would like for her PCP Dr.  Hilma Favors, Jenny Reichmann to maintain refills of her anticoagulation for life please. She states she has 1 refill at the pharmacy currently.     Roxy Horseman PA-C [facility] [place] Office: [phone]  MD in clinic [place]

## 2022-08-07 ENCOUNTER — Ambulatory Visit (HOSPITAL_COMMUNITY)
Admission: RE | Admit: 2022-08-07 | Discharge: 2022-08-07 | Disposition: A | Discharge status: Home / Self Care | Payer: Medicaid Other | Source: Ambulatory Visit | Attending: Vascular Surgery | Admitting: Vascular Surgery

## 2022-08-07 ENCOUNTER — Ambulatory Visit: Payer: Medicaid Other | Admitting: Physician Assistant

## 2022-08-07 VITALS — BP 147/84 | HR 86 | Temp 98.6°F | Resp 20 | Ht 62.0 in | Wt 154.5 lb

## 2022-08-07 DIAGNOSIS — I82412 Acute embolism and thrombosis of left femoral vein: Secondary | ICD-10-CM

## 2022-08-21 ENCOUNTER — Encounter: Payer: Self-pay | Admitting: Internal Medicine

## 2022-08-21 ENCOUNTER — Inpatient Hospital Stay: Payer: Medicaid Other

## 2022-08-21 ENCOUNTER — Inpatient Hospital Stay: Payer: Medicaid Other | Attending: Internal Medicine | Admitting: Internal Medicine

## 2022-08-21 VITALS — BP 158/91 | HR 66 | Temp 98.7°F | Resp 20 | Wt 156.2 lb

## 2022-08-21 DIAGNOSIS — Z7901 Long term (current) use of anticoagulants: Secondary | ICD-10-CM

## 2022-08-21 DIAGNOSIS — I10 Essential (primary) hypertension: Secondary | ICD-10-CM | POA: Diagnosis not present

## 2022-08-21 DIAGNOSIS — I82403 Acute embolism and thrombosis of unspecified deep veins of lower extremity, bilateral: Secondary | ICD-10-CM

## 2022-08-21 DIAGNOSIS — I82432 Acute embolism and thrombosis of left popliteal vein: Secondary | ICD-10-CM

## 2022-08-21 DIAGNOSIS — I82409 Acute embolism and thrombosis of unspecified deep veins of unspecified lower extremity: Secondary | ICD-10-CM | POA: Insufficient documentation

## 2022-08-21 DIAGNOSIS — Z86718 Personal history of other venous thrombosis and embolism: Secondary | ICD-10-CM

## 2022-08-21 DIAGNOSIS — I82412 Acute embolism and thrombosis of left femoral vein: Secondary | ICD-10-CM | POA: Diagnosis present

## 2022-08-21 NOTE — Progress Notes (Signed)
[place]  [phone] [phone]  ID: Sarah Cross OB: [birth year]  MR#: [id number]  VOH#:[id number]  Patient Care Team: Sharilyn Sites, MD as PCP - General (Family Medicine)  REFERRING PROVIDER: Sharilyn Sites  REASON FOR REFERRAL: recurrent DVT  HPI: Sarah Cross is a 41 y.o. female with pmh of HTN, sickle cell trait, GERD was referred to Hematology for recurrent DVT work up.   Patient had an episode of right leg DVT after surgery in 2008. In July 2023, she presented to ED for left leg swelling. Venous Doppler from 04/23/2022 showed acute extensive left lower extremity femoral popliteal DVT.  She was started on Eliquis.  She had hernia repair on 03/05/2022.  Reports history of provoked DVT in her uncle after surgery.   REVIEW OF SYSTEMS:   ROS  As per HPI. Otherwise, a complete review of systems is negative.  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   Hydrocephalus in newborn [facility])    Hypertension    Nausea 12/26/2014   PONV (postoperative nausea and vomiting)    Pregnant 12/26/2014   Sickle cell anemia ([place])    has trait   Spotting during pregnancy in first trimester 12/26/2014   Vaginal Pap smear, abnormal     PAST SURGICAL HISTORY: Past Surgical History:  Procedure Laterality Date   BLADDER TUMOR EXCISION     CESAREAN SECTION     CESAREAN SECTION N/A 07/20/2015   Procedure: REPEAT CESAREAN SECTION;  Surgeon: Jonnie Kind, MD;  Location: [facility];  Service: Obstetrics;  Laterality: N/A;   CSF SHUNT     INCISIONAL HERNIA REPAIR N/A 03/05/2022   Procedure: HERNIA REPAIR INCISIONAL W/ MESH;  Surgeon: Aviva Signs, MD;  Location: [facility];  Service: General;   Laterality: N/A;   MASS EXCISION N/A 07/04/2021   Procedure: EXCISION MASS; ABDOMEN;  Surgeon: Aviva Signs, MD;  Location: [facility];  Service: General;  Laterality: N/A;   SHOULDER ARTHROSCOPY WITH LABRAL REPAIR Right 10/02/2021   Procedure: SHOULDER ARTHROSCOPY WITH LABRAL REPAIR, BICEPS TENOTOMY;  Surgeon: Mordecai Rasmussen, MD;  Location: [facility];  Service: Orthopedics;  Laterality: Right;    FAMILY HISTORY: Family History  Problem Relation Age of Onset   Diabetes Mother    Cancer Mother        lung   Hypertension Father    Heart disease Maternal Grandmother    Diabetes Maternal Grandmother    Heart disease Paternal Grandmother    Diabetes Paternal Grandmother    Cancer Paternal Grandfather    Diabetes Sister    Heart disease Sister    Thyroid disease Sister    Diabetes Sister     HEALTH MAINTENANCE: Social History   Tobacco Use   Smoking status: Never    Passive exposure: Never   Smokeless tobacco: Never  Vaping Use   Vaping Use: Never used  Substance Use Topics   Alcohol use: No   Drug use: No     Allergies  Allergen Reactions   Ivp Dye [Iodinated Contrast Media] Nausea And Vomiting   Latex Itching and Other (See Comments)    Burns hands    Current Outpatient Medications  Medication Sig Dispense  Refill   acetaminophen (TYLENOL) 500 MG tablet Take 1,000 mg by mouth every 6 (six) hours as needed for moderate pain.     Elastic Bandages & Supports (MEDICAL COMPRESSION THIGH HIGH) MISC Thigh-high compression stocking to the left leg at 20-30 mmHg 1 each 0   ELIQUIS 5 MG TABS tablet Take 5 mg by mouth 2 (two) times daily.     losartan (COZAAR) 25 MG tablet TAKE 1 TABLET(25 MG) BY MOUTH DAILY 90 tablet 4   Multiple Vitamins-Minerals (HAIR SKIN AND NAILS FORMULA) TABS Take 1 tablet by mouth daily.     polyethylene glycol (MIRALAX / GLYCOLAX) 17 g packet Take 17 g by mouth 2 (two) times daily. 28 each 1   alum & mag hydroxide-simeth (MAALOX PLUS) 400-400-40 MG/5ML  suspension Take 15 mLs by mouth every 6 (six) hours as needed for indigestion. (Patient not taking: Reported on 03/30/2022) 355 mL 0   cyclobenzaprine (FLEXERIL) 10 MG tablet Take 10 mg by mouth 3 (three) times daily. (Patient not taking: Reported on 08/21/2022)     ibuprofen (ADVIL) 800 MG tablet Take 800 mg by mouth 3 (three) times daily. (Patient not taking: Reported on 06/26/2022)     ondansetron (ZOFRAN) 4 MG tablet Take 1 tablet (4 mg total) by mouth every 8 (eight) hours as needed for nausea or vomiting. (Patient not taking: Reported on 03/30/2022) 4 tablet 0   ondansetron (ZOFRAN-ODT) 4 MG disintegrating tablet '4mg'$  ODT q4 hours prn nausea/vomit (Patient not taking: Reported on 03/30/2022) 30 tablet 0   oxyCODONE-acetaminophen (PERCOCET) 5-325 MG tablet Take 1 tablet by mouth every 4 (four) hours as needed for severe pain. (Patient not taking: Reported on 03/30/2022) 30 tablet 0   POTASSIUM PO Take 1 tablet by mouth daily. (Patient not taking: Reported on 08/21/2022)     No current facility-administered medications for this visit.    OBJECTIVE: Vitals:   08/21/22 1450  BP: (!) 158/91  Pulse: 66  Resp: 20  Temp: 98.7 F (37.1 C)  SpO2: 100%     Body mass index is 28.57 kg/m.      General: Well-developed, well-nourished, no acute distress. Eyes: Pink conjunctiva, anicteric sclera. HEENT: Normocephalic, moist mucous membranes, clear oropharnyx. Lungs: Clear to auscultation bilaterally. Heart: Regular rate and rhythm. No rubs, murmurs, or gallops. Abdomen: Soft, nontender, nondistended. No organomegaly noted, normoactive bowel sounds. Musculoskeletal: No edema, cyanosis, or clubbing. Neuro: Alert, answering all questions appropriately. Cranial nerves grossly intact. Skin: No rashes or petechiae noted. Psych: Normal affect. Lymphatics: No cervical, calvicular, axillary or inguinal LAD.   LAB RESULTS:  Lab Results  Component Value Date   NA 139 04/23/2022   K 3.1 (L) 04/23/2022    CL 108 04/23/2022   CO2 25 04/23/2022   GLUCOSE 88 04/23/2022   BUN 9 04/23/2022   CREATININE 0.88 04/23/2022   CALCIUM 9.4 04/23/2022   PROT 9.2 (H) 04/23/2022   ALBUMIN 5.2 (H) 04/23/2022   AST 21 04/23/2022   ALT 15 04/23/2022   ALKPHOS 77 04/23/2022   BILITOT 0.4 04/23/2022   GFRNONAA >60 04/23/2022   GFRAA >60 06/26/2017    Lab Results  Component Value Date   WBC 8.2 04/23/2022   NEUTROABS 5.0 04/23/2022   HGB 11.1 (L) 04/23/2022   HCT 34.5 (L) 04/23/2022   MCV 75.2 (L) 04/23/2022   PLT 417 (H) 04/23/2022    Lab Results  Component Value Date   TIBC 411 09/12/2007   FERRITIN 14 09/12/2007   IRONPCTSAT 23 09/12/2007  STUDIES: VAS [place] LOWER EXTREMITY VENOUS (DVT)  Result Date: 08/07/2022  Lower Venous DVT Study Patient Name:  Sarah Cross  Date of Exam:   08/07/2022 Medical Rec #: [id number]                    Accession #:    [id number] Date of Birth: 15-Apr-1981                     Patient Gender: F Patient Age:   70 years Exam Location:  [facility] Procedure:      VAS [place] LOWER EXTREMITY VENOUS (DVT) Referring Phys: Vonna Kotyk ROBINS --------------------------------------------------------------------------------  Indications: Evaluate left leg DVT.  Risk Factors: DVT left. Comparison Study: 04/23/2022 Extensive left lower extremity femoropopliteal DVT Performing Technologist: Ronal Fear RVS, RCS  Examination Guidelines: A complete evaluation includes B-mode imaging, spectral Doppler, color Doppler, and power Doppler as needed of all accessible portions of each vessel. Bilateral testing is considered an integral part of a complete examination. Limited examinations for reoccurring indications may be performed as noted. The reflux portion of the exam is performed with the patient in reverse Trendelenburg.  +---------+---------------+---------+-----------+----------+--------------+ LEFT      CompressibilityPhasicitySpontaneityPropertiesThrombus Aging +---------+---------------+---------+-----------+----------+--------------+ CFV      Partial                                      Chronic        +---------+---------------+---------+-----------+----------+--------------+ FV Prox  Partial                                      Chronic        +---------+---------------+---------+-----------+----------+--------------+ FV Mid   Partial                                      Chronic        +---------+---------------+---------+-----------+----------+--------------+ FV DistalPartial                                      Chronic        +---------+---------------+---------+-----------+----------+--------------+ POP      Full                                                        +---------+---------------+---------+-----------+----------+--------------+ PTV      Full                                                        +---------+---------------+---------+-----------+----------+--------------+ GSV      Full                                                        +---------+---------------+---------+-----------+----------+--------------+  SSV      Full                                                        +---------+---------------+---------+-----------+----------+--------------+   Left Technical Findings: Not visualized segments include peroneal veins.   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 -The popliteal vein is patent and compressible.  *See table(s) above for measurements and observations. Electronically signed by Orlie Pollen on 08/07/2022 at 4:27:36 PM.    Final     ASSESSMENT AND PLAN:   Sarah Cross is a 41 y.o. female with pmh of HTN, sickle cell trait, GERD was referred to Hematology for recurrent  DVT work up.   #Recurrent provoked DVT - Patient had an episode of right leg DVT after surgery in 2008. In July 2023, she presented to ED for left leg swelling. Venous Doppler from 04/23/2022 showed acute extensive left lower extremity femoral popliteal DVT.  She was started on Eliquis.  She had hernia repair on 03/05/2022 and was thought to be provoked.  However due to occurrence of 2 episodes, patient was referred for hypercoag work up.  Patient is concerned and would like to get the testing done.  -Patient with 2 prior history of provoked DVT from transient risk factor such as surgery do not necessarily need indefinite anticoagulation.  But the data comes from observational studies.  Discussed about the data.  Patient is tolerating Eliquis well and she would like to continue it lifelong.  She has been monitoring her urine and stools for any bleeding.  Advised to inform the provider if she has any head injury.  Orders Placed This Encounter  Procedures   Antithrombin III   Protein C activity   Protein C, total   Protein S activity   Protein S, total   Beta-2-glycoprotein i abs, IgG/M/A   Factor 5 leiden   Prothrombin gene mutation   Cardiolipin antibodies, IgG, IgM, IgA   Hexagonal Phase Phospholipid   RTC in 2 weeks for MyChart video to discuss labs Patient expressed understanding and was in agreement with this plan. She also understands that She can call clinic at any time with any questions, concerns, or complaints.   I spent a total of 45 minutes reviewing chart data, face-to-face evaluation with the patient, counseling and coordination of care as detailed above.  Sarah Canary, MD   08/21/2022 8:22 PM

## 2022-08-22 LAB — ANTITHROMBIN III: AntiThromb III Func: 109 % (ref 75–120)

## 2022-08-24 LAB — CARDIOLIPIN ANTIBODIES, IGG, IGM, IGA
Anticardiolipin IgA: <9 APL U/mL (ref 0–11)
Anticardiolipin IgG: <9 GPL U/mL (ref 0–14)
Anticardiolipin IgM: <9 MPL U/mL (ref 0–12)

## 2022-08-24 LAB — BETA-2-GLYCOPROTEIN I ABS, IGG/M/A
Beta-2 Glyco I IgG: 29 GPI IgG units — ABNORMAL HIGH (ref 0–20)
Beta-2-Glycoprotein I IgA: <9 GPI IgA units (ref 0–25)
Beta-2-Glycoprotein I IgM: <9 GPI IgM units (ref 0–32)

## 2022-08-24 LAB — PROTEIN C, TOTAL: Protein C, Total: 107 % (ref 60–150)

## 2022-08-25 LAB — PROTEIN S ACTIVITY: Protein S Activity: 49 % — ABNORMAL LOW (ref 63–140)

## 2022-08-25 LAB — PROTEIN S, TOTAL: Protein S Ag, Total: 59 % — ABNORMAL LOW (ref 60–150)

## 2022-08-25 LAB — HEX PHASE PHOSPHOLIPID REFLEX

## 2022-08-25 LAB — PROTEIN C ACTIVITY: Protein C Activity: 116 % (ref 73–180)

## 2022-08-25 LAB — HEXAGONAL PHASE PHOSPHOLIPID: Hex Phosph Neut Test: 7 s (ref 0–11)

## 2022-08-28 LAB — PROTHROMBIN GENE MUTATION

## 2022-08-28 LAB — FACTOR 5 LEIDEN

## 2022-09-07 ENCOUNTER — Encounter: Payer: Self-pay | Admitting: Internal Medicine

## 2022-09-07 ENCOUNTER — Inpatient Hospital Stay (HOSPITAL_BASED_OUTPATIENT_CLINIC_OR_DEPARTMENT_OTHER): Payer: Medicaid Other | Admitting: Internal Medicine

## 2022-09-07 DIAGNOSIS — R760 Raised antibody titer: Secondary | ICD-10-CM

## 2022-09-07 DIAGNOSIS — I82432 Acute embolism and thrombosis of left popliteal vein: Secondary | ICD-10-CM

## 2022-09-07 DIAGNOSIS — Z86718 Personal history of other venous thrombosis and embolism: Secondary | ICD-10-CM | POA: Diagnosis not present

## 2022-09-07 NOTE — Progress Notes (Signed)
Patient has virtual visit scheduled for follow up regarding recurrent DVT, lab results. Patient denies any concerns.

## 2022-09-07 NOTE — Progress Notes (Signed)
[place]  Telephone:([phone] [phone]  I connected with Mathea Frieling Echols-Blackstock on 09/07/22 at  3:00 PM EST by my chart video and verified that I am speaking with the correct person using two identifiers.   I discussed the limitations, risks, security and privacy concerns of performing an evaluation and management service by telemedicine and the availability of in-person appointments. I also discussed with the patient that there may be a patient responsible charge related to this service. The patient expressed understanding and agreed to proceed.   Other persons participating in the visit and their role in the encounter: none   Patient's location: Home Provider's location: Office  Chief Complaint: Discuss labs   ID: Sarah Cross: [birth year]  MR#: [id number]  XKG#:[id number]  Patient Care Team: Sharilyn Sites, MD as PCP - General (Family Medicine)  REFERRING PROVIDER: Sharilyn Sites  REASON FOR REFERRAL: recurrent DVT  HPI: Sarah Cross is a 41 y.o. female with pmh of HTN, sickle cell trait, GERD was referred to Hematology for recurrent DVT work up.   Patient had an episode of right leg DVT after endometriosis surgery in 2008. In July 2023, she presented to ED for left leg swelling. Venous Doppler from 04/23/2022 showed acute extensive left lower extremity femoral popliteal DVT.  She was started on Eliquis.  She had hernia repair on 03/05/2022.  Reports history of provoked DVT in her uncle after surgery.  Interval history Patient was seen today via MyChart video to discuss labs. She has been tolerating Eliquis well.  Denies any complaints.  REVIEW OF SYSTEMS:   ROS  As per HPI. Otherwise, a complete review of systems is negative.  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   Hydrocephalus in newborn [facility])    Hypertension    Nausea 12/26/2014   PONV (postoperative nausea and vomiting)    Pregnant 12/26/2014   Sickle cell anemia ([place])    has trait   Spotting during pregnancy in first trimester 12/26/2014   Vaginal Pap smear, abnormal     PAST SURGICAL HISTORY: Past Surgical History:  Procedure Laterality Date   BLADDER TUMOR EXCISION     CESAREAN SECTION     CESAREAN SECTION N/A 07/20/2015   Procedure: REPEAT CESAREAN SECTION;  Surgeon: Jonnie Kind, MD;  Location: [facility];  Service: Obstetrics;  Laterality: N/A;   CSF SHUNT     INCISIONAL HERNIA REPAIR N/A 03/05/2022   Procedure: HERNIA REPAIR INCISIONAL W/ MESH;  Surgeon: Aviva Signs, MD;  Location: [facility];  Service: General;  Laterality: N/A;   MASS EXCISION N/A 07/04/2021   Procedure: EXCISION MASS; ABDOMEN;  Surgeon: Aviva Signs, MD;  Location: [facility];  Service: General;  Laterality: N/A;   SHOULDER ARTHROSCOPY WITH LABRAL REPAIR Right 10/02/2021   Procedure: SHOULDER ARTHROSCOPY WITH LABRAL REPAIR, BICEPS TENOTOMY;  Surgeon: Mordecai Rasmussen, MD;  Location: [facility];  Service: Orthopedics;  Laterality: Right;    FAMILY HISTORY: Family History  Problem Relation Age of Onset   Diabetes Mother    Cancer Mother        lung   Hypertension Father    Heart disease Maternal Grandmother    Diabetes  Maternal Grandmother    Heart disease Paternal Grandmother    Diabetes Paternal Grandmother    Cancer Paternal Grandfather    Diabetes Sister    Heart disease Sister    Thyroid disease Sister    Diabetes Sister     HEALTH MAINTENANCE: Social History   Tobacco Use   Smoking status: Never    Passive exposure: Never   Smokeless tobacco: Never  Vaping Use   Vaping Use: Never used  Substance Use Topics   Alcohol use: No   Drug use: No     Allergies  Allergen Reactions   Ivp Dye  [Iodinated Contrast Media] Nausea And Vomiting   Latex Itching and Other (See Comments)    Burns hands    Current Outpatient Medications  Medication Sig Dispense Refill   acetaminophen (TYLENOL) 500 MG tablet Take 1,000 mg by mouth every 6 (six) hours as needed for moderate pain.     Elastic Bandages & Supports (MEDICAL COMPRESSION THIGH HIGH) MISC Thigh-high compression stocking to the left leg at 20-30 mmHg 1 each 0   ELIQUIS 5 MG TABS tablet Take 5 mg by mouth 2 (two) times daily.     losartan (COZAAR) 25 MG tablet TAKE 1 TABLET(25 MG) BY MOUTH DAILY 90 tablet 4   Multiple Vitamins-Minerals (HAIR SKIN AND NAILS FORMULA) TABS Take 1 tablet by mouth daily.     polyethylene glycol (MIRALAX / GLYCOLAX) 17 g packet Take 17 g by mouth 2 (two) times daily. 28 each 1   alum & mag hydroxide-simeth (MAALOX PLUS) 400-400-40 MG/5ML suspension Take 15 mLs by mouth every 6 (six) hours as needed for indigestion. (Patient not taking: Reported on 03/30/2022) 355 mL 0   cyclobenzaprine (FLEXERIL) 10 MG tablet Take 10 mg by mouth 3 (three) times daily. (Patient not taking: Reported on 08/21/2022)     ibuprofen (ADVIL) 800 MG tablet Take 800 mg by mouth 3 (three) times daily. (Patient not taking: Reported on 06/26/2022)     ondansetron (ZOFRAN) 4 MG tablet Take 1 tablet (4 mg total) by mouth every 8 (eight) hours as needed for nausea or vomiting. (Patient not taking: Reported on 03/30/2022) 4 tablet 0   ondansetron (ZOFRAN-ODT) 4 MG disintegrating tablet '4mg'$  ODT q4 hours prn nausea/vomit (Patient not taking: Reported on 03/30/2022) 30 tablet 0   oxyCODONE-acetaminophen (PERCOCET) 5-325 MG tablet Take 1 tablet by mouth every 4 (four) hours as needed for severe pain. (Patient not taking: Reported on 03/30/2022) 30 tablet 0   POTASSIUM PO Take 1 tablet by mouth daily. (Patient not taking: Reported on 08/21/2022)     No current facility-administered medications for this visit.    OBJECTIVE: There were no vitals filed  for this visit.    There is no height or weight on file to calculate BMI.      General: Well-developed, well-nourished, no acute distress. Eyes: Pink conjunctiva, anicteric sclera. HEENT: Normocephalic, moist mucous membranes, clear oropharnyx. Lungs: Clear to auscultation bilaterally. Heart: Regular rate and rhythm. No rubs, murmurs, or gallops. Abdomen: Soft, nontender, nondistended. No organomegaly noted, normoactive bowel sounds. Musculoskeletal: No edema, cyanosis, or clubbing. Neuro: Alert, answering all questions appropriately. Cranial nerves grossly intact. Skin: No rashes or petechiae noted. Psych: Normal affect. Lymphatics: No cervical, calvicular, axillary or inguinal LAD.   LAB RESULTS:  Lab Results  Component Value Date   NA 139 04/23/2022   K 3.1 (L) 04/23/2022   CL 108 04/23/2022   CO2 25 04/23/2022   GLUCOSE 88 04/23/2022  BUN 9 04/23/2022   CREATININE 0.88 04/23/2022   CALCIUM 9.4 04/23/2022   PROT 9.2 (H) 04/23/2022   ALBUMIN 5.2 (H) 04/23/2022   AST 21 04/23/2022   ALT 15 04/23/2022   ALKPHOS 77 04/23/2022   BILITOT 0.4 04/23/2022   GFRNONAA >60 04/23/2022   GFRAA >60 06/26/2017    Lab Results  Component Value Date   WBC 8.2 04/23/2022   NEUTROABS 5.0 04/23/2022   HGB 11.1 (L) 04/23/2022   HCT 34.5 (L) 04/23/2022   MCV 75.2 (L) 04/23/2022   PLT 417 (H) 04/23/2022    Lab Results  Component Value Date   TIBC 411 09/12/2007   FERRITIN 14 09/12/2007   IRONPCTSAT 23 09/12/2007     STUDIES: No results found.  ASSESSMENT AND PLAN:   AMAAL DIMARTINO is a 41 y.o. female with pmh of HTN, sickle cell trait, GERD was referred to Hematology for recurrent DVT work up.   #Recurrent provoked DVT - Patient had an episode of right leg DVT after endometriosis surgery in 2008.  On 04/23/2022 found to have acute left lower extremity femoral popliteal DVT post incisional hernia repair surgery on 03/05/2022.   -Patient interested in hypercoag  workup.  Beta-2 glycoprotein IgG elevated to 29.  Protein S activity mildly low to 49.  Discussed with the patient about repeating antiphospholipid antibody and protein S in 3 months to assess for persistence.  Protein S activity can be affected by Eliquis so hard to tell if truly low or medication effect.  Will discuss duration of anticoagulation after repeat testing.  However at this time, patient prefers to continue Eliquis lifelong and has been tolerating well.  Her bleeding risk is low per VTE bleed risk score. In future, if patient decides to discontinue Eliquis, I I would like to recheck her protein S activity off Eliquis.  Orders Placed This Encounter  Procedures   Protein S activity   Beta-2-glycoprotein i abs, IgG/M/A   Cardiolipin antibodies, IgG, IgM, IgA   RTC in 3 months for labs only then following week MyChart video to discuss.   Patient expressed understanding and was in agreement with this plan. She also understands that She can call clinic at any time with any questions, concerns, or complaints.   I spent a total of 45 minutes reviewing chart data, face-to-face evaluation with the patient, counseling and coordination of care as detailed above.  Jane Canary, MD   09/07/2022 3:54 PM

## 2022-09-07 NOTE — Addendum Note (Signed)
Addended byJane Canary on: 09/07/2022 04:07 PM   Modules accepted: Level of Service

## 2022-09-29 ENCOUNTER — Ambulatory Visit: Payer: Medicaid Other | Admitting: Adult Health

## 2022-10-08 ENCOUNTER — Ambulatory Visit (INDEPENDENT_AMBULATORY_CARE_PROVIDER_SITE_OTHER): Payer: Medicaid Other | Admitting: Adult Health

## 2022-10-08 ENCOUNTER — Encounter: Payer: Self-pay | Admitting: Adult Health

## 2022-10-08 VITALS — BP 150/69 | HR 91 | Ht 61.0 in | Wt 154.0 lb

## 2022-10-08 DIAGNOSIS — Z0000 Encounter for general adult medical examination without abnormal findings: Secondary | ICD-10-CM

## 2022-10-08 DIAGNOSIS — N939 Abnormal uterine and vaginal bleeding, unspecified: Secondary | ICD-10-CM | POA: Diagnosis not present

## 2022-10-08 DIAGNOSIS — N9489 Other specified conditions associated with female genital organs and menstrual cycle: Secondary | ICD-10-CM

## 2022-10-08 DIAGNOSIS — I10 Essential (primary) hypertension: Secondary | ICD-10-CM | POA: Diagnosis not present

## 2022-10-08 DIAGNOSIS — N930 Postcoital and contact bleeding: Secondary | ICD-10-CM

## 2022-10-08 DIAGNOSIS — Z1211 Encounter for screening for malignant neoplasm of colon: Secondary | ICD-10-CM

## 2022-10-08 DIAGNOSIS — Z01419 Encounter for gynecological examination (general) (routine) without abnormal findings: Secondary | ICD-10-CM

## 2022-10-08 DIAGNOSIS — Z86718 Personal history of other venous thrombosis and embolism: Secondary | ICD-10-CM

## 2022-10-08 LAB — HEMOCCULT GUIAC POC 1CARD (OFFICE): Fecal Occult Blood, POC: NEGATIVE

## 2022-10-08 MED ORDER — LOSARTAN POTASSIUM 25 MG PO TABS: ORAL_TABLET | ORAL | 4 refills | Status: DC

## 2022-10-08 NOTE — Progress Notes (Signed)
Patient ID: Sarah Cross, female   DOB: [birth year], 41 y.o.   MRN: [id number] History of Present Illness: Sarah Cross is a 41 year old black female, divorced but now engaged to [place], [place], in for a well woman gyn exam. Had 8 day period and bleeding after sex. She is on Eliquis for DVT LLE in July 2023.      Component Value Date/Time   DIAGPAP  10/01/2021 1141    - Negative for intraepithelial lesion or malignancy (NILM)   DIAGPAP  08/05/2018 0000    NEGATIVE FOR INTRAEPITHELIAL LESIONS OR MALIGNANCY.   [place] Negative 10/01/2021 1141   ADEQPAP  10/01/2021 1141    Satisfactory for evaluation; transformation zone component ABSENT.   ADEQPAP  08/05/2018 0000    Satisfactory for evaluation  endocervical/transformation zone component ABSENT.    PCP is Dr Hilma Favors.   Current Medications, Allergies, Past Medical History, Past Surgical History, Family History and Social History were reviewed in [employer] record.     Review of Systems: Patient denies any headaches, hearing loss, fatigue, blurred vision, shortness of breath, chest pain, abdominal pain, problems with bowel movements, urination, or intercourse. No joint pain or mood swings.  See HPI for positives.  Physical Exam:BP (!) 150/69 (BP Location: Left Arm, Patient Position: Sitting, Cuff Size: Normal)   Pulse 91   Ht '5\' 1"'$  (1.549 m)   Wt 154 lb (69.9 kg)   LMP 09/20/2022   BMI 29.10 kg/m   General:  Well developed, well nourished, no acute distress Skin:  Warm and dry Neck:  Midline trachea, normal thyroid, good ROM, no lymphadenopathy Lungs; Clear to auscultation bilaterally Breast:  No dominant palpable mass, retraction, or nipple discharge Cardiovascular: Regular rate and rhythm Abdomen:  Soft, non tender, no hepatosplenomegaly Pelvic:  External genitalia is normal in appearance, no lesions.  The vagina is normal in appearance. Urethra has no lesions or masses. The cervix is smooth.  Uterus  is felt to be normal size, shape, and contour.  No adnexal masses or tenderness noted.Bladder is non tender, no masses felt. Rectal: Good sphincter tone, no polyps, or hemorrhoids felt.  Hemoccult negative. Extremities/musculoskeletal:  No swelling or varicosities noted, no clubbing or cyanosis Psych:  No mood changes, alert and cooperative,seems happy AA is 0 Fall risk is low    10/08/2022    3:41 PM 10/01/2021   10:50 AM 08/07/2020    3:45 PM  Depression screen PHQ 2/9  Decreased Interest 0 0 2  Down, Depressed, Hopeless 0 0 1  PHQ - 2 Score 0 0 3  Altered sleeping '3 1 1  '$ Tired, decreased energy '1 1 1  '$ Change in appetite 0 0 2  Feeling bad or failure about yourself  0 0 0  Trouble concentrating 0 0 0  Moving slowly or fidgety/restless 0 0 1  Suicidal thoughts 0 0 0  PHQ-9 Score '4 2 8       '$ 10/08/2022    3:42 PM 10/01/2021   10:51 AM 08/07/2020    3:46 PM  GAD 7 : Generalized Anxiety Score  Nervous, Anxious, on Edge 0 0 0  Control/stop worrying 0 0 0  Worry too much - different things 0 0 0  Trouble relaxing 0 0 1  Restless 0 0 0  Easily annoyed or irritable 0 0 0  Afraid - awful might happen 0 0 0  Total GAD 7 Score 0 0 1      Upstream - 10/08/22 1532  Pregnancy Intention Screening   Does the patient want to become pregnant in the next year? Unsure    Does the patient's partner want to become pregnant in the next year? Unsure    Would the patient like to discuss contraceptive options today? No      Contraception Wrap Up   Current Method No Contraceptive Precautions    End Method No Contraception Precautions    Contraception Counseling Provided No            Examination chaperoned by Celene Squibb LPN  Impression and Plan: 1. Women's annual routine gynecological examination Physical in 1 year Pap in 2025 Labs with PCP  2. Encounter for screening fecal occult blood testing Hemoccult was negative   3. Hypertension, unspecified type Will refill  cozaar Meds ordered this encounter  Medications   losartan (COZAAR) 25 MG tablet    Sig: TAKE 1 TABLET(25 MG) BY MOUTH DAILY    Dispense:  90 tablet    Refill:  4    Order Specific Question:   Supervising Provider    Answer:   Elonda Husky, LUTHER H [[id number]]     4. Abnormal uterine bleeding (AUB) Will get [place] 10/14/22 at 12:30 pm at [facility] to assess uterus and ovaries  Will talk when results back  5. Uterine cramping Will get [place]  6. History of DVT of lower extremity On Eliquis   7. Postcoital bleeding Use good lubrication Increase foreplay Try different positions Discussed could be related to being on Eliquis

## 2022-10-14 ENCOUNTER — Ambulatory Visit (HOSPITAL_COMMUNITY)
Admission: RE | Admit: 2022-10-14 | Discharge: 2022-10-14 | Disposition: A | Discharge status: Home / Self Care | Payer: Medicaid Other | Source: Ambulatory Visit | Attending: Adult Health | Admitting: Adult Health

## 2022-10-14 ENCOUNTER — Encounter: Payer: Self-pay | Admitting: Adult Health

## 2022-10-14 DIAGNOSIS — N939 Abnormal uterine and vaginal bleeding, unspecified: Secondary | ICD-10-CM | POA: Diagnosis present

## 2022-10-14 DIAGNOSIS — N9489 Other specified conditions associated with female genital organs and menstrual cycle: Secondary | ICD-10-CM | POA: Diagnosis present

## 2022-10-14 DIAGNOSIS — D219 Benign neoplasm of connective and other soft tissue, unspecified: Secondary | ICD-10-CM | POA: Insufficient documentation

## 2022-12-08 ENCOUNTER — Inpatient Hospital Stay: Payer: Medicaid Other | Attending: Internal Medicine

## 2022-12-08 DIAGNOSIS — Z7901 Long term (current) use of anticoagulants: Secondary | ICD-10-CM | POA: Insufficient documentation

## 2022-12-08 DIAGNOSIS — Z86718 Personal history of other venous thrombosis and embolism: Secondary | ICD-10-CM

## 2022-12-09 LAB — BETA-2-GLYCOPROTEIN I ABS, IGG/M/A
Beta-2 Glyco I IgG: 26 GPI IgG units — ABNORMAL HIGH (ref 0–20)
Beta-2-Glycoprotein I IgA: 20 GPI IgA units (ref 0–25)
Beta-2-Glycoprotein I IgM: <9 GPI IgM units (ref 0–32)

## 2022-12-09 LAB — CARDIOLIPIN ANTIBODIES, IGG, IGM, IGA
Anticardiolipin IgA: <9 APL U/mL (ref 0–11)
Anticardiolipin IgG: <9 GPL U/mL (ref 0–14)
Anticardiolipin IgM: <9 MPL U/mL (ref 0–12)

## 2022-12-09 LAB — PROTEIN S ACTIVITY: Protein S Activity: 44 % — ABNORMAL LOW (ref 63–140)

## 2022-12-10 ENCOUNTER — Encounter: Payer: Self-pay | Admitting: Radiology

## 2022-12-15 ENCOUNTER — Inpatient Hospital Stay: Payer: Medicaid Other | Attending: Internal Medicine | Admitting: Internal Medicine

## 2022-12-15 DIAGNOSIS — I82409 Acute embolism and thrombosis of unspecified deep veins of unspecified lower extremity: Secondary | ICD-10-CM

## 2022-12-15 DIAGNOSIS — D509 Iron deficiency anemia, unspecified: Secondary | ICD-10-CM

## 2022-12-15 DIAGNOSIS — M79604 Pain in right leg: Secondary | ICD-10-CM | POA: Diagnosis not present

## 2022-12-15 DIAGNOSIS — R760 Raised antibody titer: Secondary | ICD-10-CM | POA: Diagnosis not present

## 2022-12-15 DIAGNOSIS — R718 Other abnormality of red blood cells: Secondary | ICD-10-CM

## 2022-12-15 DIAGNOSIS — M79605 Pain in left leg: Secondary | ICD-10-CM

## 2022-12-15 MED ORDER — FERROUS GLUCONATE 324 (38 FE) MG PO TABS: 324.0000 mg | ORAL_TABLET | Freq: Every day | ORAL | 3 refills | Status: AC

## 2022-12-15 NOTE — Progress Notes (Signed)
[place]  Telephone:([phone] [phone]  I connected with Sarah Cross on 12/15/22 at  3:00 PM EST by my chart video and verified that I am speaking with the correct person using two identifiers.   I discussed the limitations, risks, security and privacy concerns of performing an evaluation and management service by telemedicine and the availability of in-person appointments. I also discussed with the patient that there may be a patient responsible charge related to this service. The patient expressed understanding and agreed to proceed.   Other persons participating in the visit and their role in the encounter: none   Patient's location: Home Provider's location: Office  Chief Complaint: Discuss labs   ID: Sarah Cross OB: Dec 11, 1980  MR#: [id number]  [id number]  Patient Care Team: Sharilyn Sites, MD as PCP - General (Family Medicine)  REFERRING PROVIDER: Sharilyn Sites  REASON FOR REFERRAL: recurrent DVT  HPI: Sarah Cross is a 42 y.o. female with pmh of HTN, sickle cell trait, GERD was referred to Hematology for recurrent DVT work up.   Patient had an episode of right leg DVT after endometriosis surgery in 2008. In July 2023, she presented to ED for left leg swelling. Venous Doppler from 04/23/2022 showed acute extensive left lower extremity femoral popliteal DVT.  She was started on Eliquis.  She had hernia repair on 03/05/2022.  Reports history of provoked DVT in her uncle after surgery.  Interval history Patient was seen today via MyChart video to discuss labs. She has been tolerating Eliquis well.  Denies any complaints.  REVIEW OF SYSTEMS:   ROS  As per HPI. Otherwise, a complete review of systems is negative.  PAST MEDICAL HISTORY: Past Medical History:  Diagnosis Date  . Abnormal Pap smear   . Anemia   . Endometriosis   . GERD (gastroesophageal reflux disease)   . History of DVT of  lower extremity 2008   1 week after bladder surgery  . 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 Hydrocephalus in newborn [facility])   . Hypertension   . Nausea 12/26/2014  . PONV (postoperative nausea and vomiting)   . Pregnant 12/26/2014  . Sickle cell anemia (HCC)    has trait  . Spotting during pregnancy in first trimester 12/26/2014  . Vaginal Pap smear, abnormal     PAST SURGICAL HISTORY: Past Surgical History:  Procedure Laterality Date  . BLADDER TUMOR EXCISION    . CESAREAN SECTION    . CESAREAN SECTION N/A 07/20/2015   Procedure: REPEAT CESAREAN SECTION;  Surgeon: Jonnie Kind, MD;  Location: [place] [facility];  Service: Obstetrics;  Laterality: N/A;  . CSF SHUNT    . INCISIONAL HERNIA REPAIR N/A 03/05/2022   Procedure: HERNIA REPAIR INCISIONAL W/ MESH;  Surgeon: Aviva Signs, MD;  Location: [facility];  Service: General;  Laterality: N/A;  . MASS EXCISION N/A 07/04/2021   Procedure: EXCISION MASS; ABDOMEN;  Surgeon: Aviva Signs, MD;  Location: [facility];  Service: General;  Laterality: N/A;  . SHOULDER ARTHROSCOPY WITH LABRAL REPAIR Right 10/02/2021   Procedure: SHOULDER ARTHROSCOPY WITH LABRAL REPAIR, BICEPS TENOTOMY;  Surgeon: Mordecai Rasmussen, MD;  Location: [facility];  Service: Orthopedics;  Laterality: Right;    FAMILY HISTORY: Family History  Problem Relation Age of Onset  . Diabetes Mother   . Cancer Mother        lung  . Hypertension Father   . Heart disease Maternal Grandmother   . Diabetes  Maternal Grandmother   . Heart disease Paternal Grandmother   . Diabetes Paternal Grandmother   . Cancer Paternal Grandfather   . Diabetes Sister   . Heart disease Sister   . Thyroid disease Sister   . Diabetes Sister     HEALTH MAINTENANCE: Social History   Tobacco Use  . Smoking status: Never    Passive exposure: Never  . Smokeless tobacco: Never  Vaping Use  . Vaping Use: Never used  Substance Use Topics  . Alcohol use: No  . Drug use: No     Allergies   Allergen Reactions  . Ivp Dye [Iodinated Contrast Media] Nausea And Vomiting  . Latex Itching and Other (See Comments)    Burns hands    Current Outpatient Medications  Medication Sig Dispense Refill  . ferrous gluconate (FERGON) 324 MG tablet Take 1 tablet (324 mg total) by mouth daily with breakfast. 90 tablet 3  . acetaminophen (TYLENOL) 500 MG tablet Take 1,000 mg by mouth every 6 (six) hours as needed for moderate pain.    Water engineer Bandages & Supports (MEDICAL COMPRESSION THIGH HIGH) MISC Thigh-high compression stocking to the left leg at 20-30 mmHg (Patient not taking: Reported on 10/08/2022) 1 each 0  . ELIQUIS 5 MG TABS tablet Take 5 mg by mouth 2 (two) times daily.    Marland Kitchen losartan (COZAAR) 25 MG tablet TAKE 1 TABLET(25 MG) BY MOUTH DAILY 90 tablet 4  . Multiple Vitamins-Minerals (HAIR SKIN AND NAILS FORMULA) TABS Take 1 tablet by mouth daily.    . polyethylene glycol (MIRALAX / GLYCOLAX) 17 g packet Take 17 g by mouth 2 (two) times daily. (Patient not taking: Reported on 10/08/2022) 28 each 1   No current facility-administered medications for this visit.    OBJECTIVE: There were no vitals filed for this visit.    There is no height or weight on file to calculate BMI.      General: Well-developed, well-nourished, no acute distress. Eyes: Pink conjunctiva, anicteric sclera. HEENT: Normocephalic, moist mucous membranes, clear oropharnyx. Lungs: Clear to auscultation bilaterally. Heart: Regular rate and rhythm. No rubs, murmurs, or gallops. Abdomen: Soft, nontender, nondistended. No organomegaly noted, normoactive bowel sounds. Musculoskeletal: No edema, cyanosis, or clubbing. Neuro: Alert, answering all questions appropriately. Cranial nerves grossly intact. Skin: No rashes or petechiae noted. Psych: Normal affect. Lymphatics: No cervical, calvicular, axillary or inguinal LAD.   LAB RESULTS:  Lab Results  Component Value Date   NA 139 04/23/2022   K 3.1 (L) 04/23/2022    CL 108 04/23/2022   CO2 25 04/23/2022   GLUCOSE 88 04/23/2022   BUN 9 04/23/2022   CREATININE 0.88 04/23/2022   CALCIUM 9.4 04/23/2022   PROT 9.2 (H) 04/23/2022   ALBUMIN 5.2 (H) 04/23/2022   AST 21 04/23/2022   ALT 15 04/23/2022   ALKPHOS 77 04/23/2022   BILITOT 0.4 04/23/2022   GFRNONAA >60 04/23/2022   GFRAA >60 06/26/2017    Lab Results  Component Value Date   WBC 8.2 04/23/2022   NEUTROABS 5.0 04/23/2022   HGB 11.1 (L) 04/23/2022   HCT 34.5 (L) 04/23/2022   MCV 75.2 (L) 04/23/2022   PLT 417 (H) 04/23/2022    Lab Results  Component Value Date   TIBC 411 09/12/2007   FERRITIN 14 09/12/2007   IRONPCTSAT 23 09/12/2007     STUDIES: No results found.  ASSESSMENT AND PLAN:   Sarah Cross is a 42 y.o. female with pmh of HTN, sickle cell trait,  GERD was referred to Hematology for recurrent DVT work up.   #Recurrent provoked DVT - Patient had an episode of right leg DVT after endometriosis surgery in 2008.  On 04/23/2022 found to have acute left lower extremity femoral popliteal DVT post incisional hernia repair surgery on 03/05/2022.   -Patient interested in hypercoag workup.  Beta-2 glycoprotein IgG elevated to 29.  Protein S activity mildly low to 49.  Discussed with the patient about repeating antiphospholipid antibody and protein S in 3 months to assess for persistence.  Protein S activity can be affected by Eliquis so hard to tell if truly low or medication effect.  Will discuss duration of anticoagulation after repeat testing.  However at this time, patient prefers to continue Eliquis lifelong and has been tolerating well.  Her bleeding risk is low per VTE bleed risk score. In future, if patient decides to discontinue Eliquis, I I would like to recheck her protein S activity off Eliquis.  #Leg pain on exertion  - I will touch base with Laurence Slate from Vascular surgery to assess need for ABI   Orders Placed This Encounter  Procedures  . CBC with  Differential/Platelet  . Iron and TIBC  . Ferritin   RTC in 3 months for labs only then following week MyChart video to discuss.   Patient expressed understanding and was in agreement with this plan. She also understands that She can call clinic at any time with any questions, concerns, or complaints.   I spent a total of 45 minutes reviewing chart data, face-to-face evaluation with the patient, counseling and coordination of care as detailed above.  Jane Canary, MD   12/15/2022 3:29 PM

## 2022-12-17 ENCOUNTER — Other Ambulatory Visit: Payer: Self-pay | Admitting: *Deleted

## 2022-12-17 DIAGNOSIS — R718 Other abnormality of red blood cells: Secondary | ICD-10-CM | POA: Insufficient documentation

## 2022-12-17 DIAGNOSIS — M79606 Pain in leg, unspecified: Secondary | ICD-10-CM | POA: Insufficient documentation

## 2022-12-17 DIAGNOSIS — I82409 Acute embolism and thrombosis of unspecified deep veins of unspecified lower extremity: Secondary | ICD-10-CM | POA: Insufficient documentation

## 2022-12-24 NOTE — Progress Notes (Signed)
[facility] [place]     History of Present Illness  Sarah Cross is a 42 y.o. female who presents for follow up exam and duplex after recurrent  left lower extremity DVT.   Previous ultrasound reviewed demonstrating nonocclusive DVT in the proximal femoral vein, occlusive in the distal femoral vein, popliteal vein.   She is here today for repeat DVT study  to assess the thrombotic burden.    She has a history of previous provoked DVT status post surgery roughly 15 years ago and is on lifelong anticoagulation.  She has had 4 different DVT occurrences the first documented in 2008.  She has continued to  wear the thigh-high compression stockings and elevation when at rest.  She is medically ,managed on Eliquis 5 mg BID.     She denies claudication, rest pain and non healing wounds.     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   Hydrocephalus in newborn [facility])    Hypertension    Nausea 12/26/2014   PONV (postoperative nausea and vomiting)    Pregnant 12/26/2014   Sickle cell anemia ([place])    has trait   Spotting during pregnancy in first trimester 12/26/2014   Vaginal Pap smear, abnormal     Past Surgical History:  Procedure Laterality Date   BLADDER TUMOR EXCISION     CESAREAN SECTION     CESAREAN SECTION N/A 07/20/2015   Procedure: REPEAT CESAREAN SECTION;  Surgeon: Jonnie Kind, MD;  Location: [facility];  Service: Obstetrics;  Laterality: N/A;   CSF SHUNT     INCISIONAL HERNIA REPAIR N/A 03/05/2022   Procedure: HERNIA REPAIR INCISIONAL W/ MESH;  Surgeon: Aviva Signs, MD;  Location: [facility];  Service: General;  Laterality: N/A;   MASS EXCISION N/A 07/04/2021   Procedure: EXCISION MASS; ABDOMEN;  Surgeon: Aviva Signs, MD;   Location: [facility];  Service: General;  Laterality: N/A;   SHOULDER ARTHROSCOPY WITH LABRAL REPAIR Right 10/02/2021   Procedure: SHOULDER ARTHROSCOPY WITH LABRAL REPAIR, BICEPS TENOTOMY;  Surgeon: Mordecai Rasmussen, MD;  Location: [facility];  Service: Orthopedics;  Laterality: Right;    Social History   Socioeconomic History   Marital status: Divorced    Spouse name: Not on file   Number of children: Not on file   Years of education: Not on file   Highest education level: Not on file  Occupational History   Not on file  Tobacco Use   Smoking status: Never    Passive exposure: Never   Smokeless tobacco: Never  Vaping Use   Vaping Use: Never used  Substance and Sexual Activity   Alcohol use: No   Drug use: No   Sexual activity: Not Currently    Birth control/protection: None  Other Topics Concern   Not on file  Social History Narrative   Not on file   Social Determinants of Health   Financial Resource Strain: Low Risk  (10/08/2022)   Overall Financial Resource Strain (CARDIA)    Difficulty of Paying Living Expenses: Not hard at all  Food Insecurity: No Food Insecurity (10/08/2022)   Hunger Vital Sign    Worried About Running Out of Food in the Last Year: Never  true    Ran Out of Food in the Last Year: Never true  Transportation Needs: No Transportation Needs (10/08/2022)   PRAPARE - Hydrologist (Medical): No    Lack of Transportation (Non-Medical): No  Physical Activity: Sufficiently Active (10/08/2022)   Exercise Vital Sign    Days of Exercise per Week: 5 days    Minutes of Exercise per Session: 30 min  Stress: No Stress Concern Present (10/08/2022)   [place]    Feeling of Stress : Not at all  Social Connections: Moderately Isolated (10/08/2022)   Social Connection and Isolation Panel [NHANES]    Frequency of Communication with Friends and Family: More than three times a week     Frequency of Social Gatherings with Friends and Family: Three times a week    Attends Religious Services: More than 4 times per year    Active Member of Clubs or Organizations: No    Attends Archivist Meetings: Never    Marital Status: Divorced  Human resources officer Violence: Not At Risk (10/08/2022)   Humiliation, Afraid, Rape, and Kick questionnaire    Fear of Current or Ex-Partner: No    Emotionally Abused: No    Physically Abused: No    Sexually Abused: No    Family History  Problem Relation Age of Onset   Diabetes Mother    Cancer Mother        lung   Hypertension Father    Heart disease Maternal Grandmother    Diabetes Maternal Grandmother    Heart disease Paternal Grandmother    Diabetes Paternal Grandmother    Cancer Paternal Grandfather    Diabetes Sister    Heart disease Sister    Thyroid disease Sister    Diabetes Sister     Current Outpatient Medications on File Prior to Visit  Medication Sig Dispense Refill   acetaminophen (TYLENOL) 500 MG tablet Take 1,000 mg by mouth every 6 (six) hours as needed for moderate pain.     Elastic Bandages & Supports (MEDICAL COMPRESSION THIGH HIGH) MISC Thigh-high compression stocking to the left leg at 20-30 mmHg (Patient not taking: Reported on 10/08/2022) 1 each 0   ELIQUIS 5 MG TABS tablet Take 5 mg by mouth 2 (two) times daily.     ferrous gluconate (FERGON) 324 MG tablet Take 1 tablet (324 mg total) by mouth daily with breakfast. 90 tablet 3   losartan (COZAAR) 25 MG tablet TAKE 1 TABLET(25 MG) BY MOUTH DAILY 90 tablet 4   Multiple Vitamins-Minerals (HAIR SKIN AND NAILS FORMULA) TABS Take 1 tablet by mouth daily.     polyethylene glycol (MIRALAX / GLYCOLAX) 17 g packet Take 17 g by mouth 2 (two) times daily. (Patient not taking: Reported on 10/08/2022) 28 each 1   No current facility-administered medications on file prior to visit.    Allergies as of 12/25/2022 - Review Complete 10/08/2022  Allergen Reaction  Noted   Ivp dye [iodinated contrast media] Nausea And Vomiting 07/02/2021   Latex Itching and Other (See Comments) 09/20/2011     ROS:   General:  No weight loss, Fever, chills  HEENT: No recent headaches, no nasal bleeding, no visual changes, no sore throat  Neurologic: No dizziness, blackouts, seizures. No recent symptoms of stroke or mini- stroke. No recent episodes of slurred speech, or temporary blindness.  Cardiac: No recent episodes of chest pain/pressure, no shortness of breath at rest.  No  shortness of breath with exertion.  Denies history of atrial fibrillation or irregular heartbeat  Vascular: No history of rest pain in feet.  No history of claudication.  No history of non-healing ulcer, positive history of DVT   Pulmonary: No home oxygen, no productive cough, no hemoptysis,  No asthma or wheezing  Musculoskeletal:  [ ]  Arthritis, [ ]  Low back pain,  [ ]  Joint pain  Hematologic:No history of hypercoagulable state.  No history of easy bleeding.  No history of anemia  Gastrointestinal: No hematochezia or melena,  No gastroesophageal reflux, no trouble swallowing  Urinary: [ ]  chronic Kidney disease, [ ]  on HD - [ ]  MWF or [ ]  TTHS, [ ]  Burning with urination, [ ]  Frequent urination, [ ]  Difficulty urinating;   Skin: No rashes  Psychological: No history of anxiety,  No history of depression  Physical Examination  There were no vitals filed for this visit.   There is no height or weight on file to calculate BMI.  General:  Alert and oriented, no acute distress HEENT: Normal Neck: No bruit or JVD Pulmonary: Clear to auscultation bilaterally Cardiac: Regular Rate and Rhythm without murmur Abdomen: Soft, non-tender, non-distended, no mass, no scars Skin: No rash Extremity Pulses:  2+ radial, brachial, femoral, dorsalis pedis, posterior tibial pulses bilaterally Musculoskeletal: No deformity or edema  Neurologic: Upper and lower extremity motor 5/5 and  symmetric  DATA:  ABI Findings:  +---------+------------------+-----+---------+--------+  Right   Rt Pressure (mmHg)IndexWaveform Comment   +---------+------------------+-----+---------+--------+  Brachial 159                                       +---------+------------------+-----+---------+--------+  PTA     209               1.31 triphasic          +---------+------------------+-----+---------+--------+  DP      195               1.23 triphasic          +---------+------------------+-----+---------+--------+  Great Toe139               0.87 Normal             +---------+------------------+-----+---------+--------+   +---------+------------------+-----+---------+-------+  Left    Lt Pressure (mmHg)IndexWaveform Comment  +---------+------------------+-----+---------+-------+  Brachial 159                                      +---------+------------------+-----+---------+-------+  PTA     195               1.23 triphasic         +---------+------------------+-----+---------+-------+  DP      173               1.09 triphasic         +---------+------------------+-----+---------+-------+  Great Toe155               0.97 Normal            +---------+------------------+-----+---------+-------+   +-------+-----------+-----------+------------+------------+  ABI/TBIToday's ABIToday's TBIPrevious ABIPrevious TBI  +-------+-----------+-----------+------------+------------+  Right 1.31       0.87                                 +-------+-----------+-----------+------------+------------+  Left  1.23       0.97                                 +-------+-----------+-----------+------------+------------+      Left Technical Findings:  Not visualized segments include peroneal veins.     Summary:   LEFT:  - Findings consistent with chronic non-occlusive deep vein thrombosis  involving the left common femoral  vein and left proximal mid and distal  femoral vein.  -The popliteal vein is patent and compressible.      Assessment/Plan: 42 y.o. female presenting for left lower extremity DVT which was appreciated 2 months ago.  Recent DVT study 7/13/123 revealed Acute extensive LEFT lower extremity femoropopliteal DVT.  She is medically managed on Eliquis, compression and exercise as tolerates.  He DVT study today revealed chronic non-occlusive deep vein thrombosis  involving the left common femoral vein and left proximal mid and distal  femoral vein.  -The popliteal vein is patent and compressible.     Although this could be considered provoked from recent hernia surgery.  In the setting of multiple thrombotic episodes, she would be best treated with lifelong anticoagulation. I have sent a referral for Hematology to work her up.    She will f/u with [facility] PRN.   I would like for her PCP Dr.  Hilma Favors, Jenny Reichmann to maintain refills of her anticoagulation for life please. She states she has 1 refill at the pharmacy currently.     Broadus John PA-C [facility] [place] Office: [phone]  MD in clinic [place]

## 2022-12-25 ENCOUNTER — Ambulatory Visit (HOSPITAL_COMMUNITY)
Admission: RE | Admit: 2022-12-25 | Discharge: 2022-12-25 | Disposition: A | Discharge status: Home / Self Care | Payer: Medicaid Other | Source: Ambulatory Visit | Attending: Vascular Surgery | Admitting: Vascular Surgery

## 2022-12-25 ENCOUNTER — Ambulatory Visit: Payer: Medicaid Other | Admitting: Vascular Surgery

## 2022-12-25 ENCOUNTER — Encounter: Payer: Self-pay | Admitting: Vascular Surgery

## 2022-12-25 VITALS — BP 143/87 | HR 78 | Temp 98.3°F | Resp 20 | Ht 61.0 in | Wt 154.0 lb

## 2022-12-25 DIAGNOSIS — Q268 Other congenital malformations of great veins: Secondary | ICD-10-CM

## 2022-12-25 DIAGNOSIS — M79606 Pain in leg, unspecified: Secondary | ICD-10-CM | POA: Diagnosis present

## 2022-12-25 DIAGNOSIS — I82412 Acute embolism and thrombosis of left femoral vein: Secondary | ICD-10-CM

## 2022-12-25 DIAGNOSIS — I8393 Asymptomatic varicose veins of bilateral lower extremities: Secondary | ICD-10-CM

## 2022-12-25 LAB — VAS US ABI WITH/WO TBI
Left ABI: 1.23
Right ABI: 1.31

## 2023-01-06 ENCOUNTER — Other Ambulatory Visit: Payer: Self-pay | Admitting: *Deleted

## 2023-01-06 MED ORDER — ELIQUIS 5 MG PO TABS: 5.0000 mg | ORAL_TABLET | Freq: Two times a day (BID) | ORAL | 6 refills | Status: DC

## 2023-06-03 ENCOUNTER — Telehealth: Payer: Self-pay | Admitting: Internal Medicine

## 2023-06-03 NOTE — Telephone Encounter (Signed)
Patient is requesting follow up appointment (9/12) to be virtual-  Please advise

## 2023-06-15 ENCOUNTER — Telehealth: Payer: Self-pay | Admitting: *Deleted

## 2023-06-15 NOTE — Telephone Encounter (Signed)
[facility] in [place], [place] Address: [address] [place], [place] [address] Phone: [phone]

## 2023-06-15 NOTE — Telephone Encounter (Signed)
Patient called reporting that she is no longer working in [facility] and wants to know if there is any way that she can get her labs drawn in [place] and then have a telephone visit with doctor. She is scheduled for lab/ physician on 9/12

## 2023-06-17 ENCOUNTER — Other Ambulatory Visit: Payer: Medicaid Other

## 2023-06-17 ENCOUNTER — Ambulatory Visit: Payer: Medicaid Other | Admitting: Internal Medicine

## 2023-06-20 IMAGING — CT CT ABD-PELV W/O CM
2 of 4 series · 16 of 46 positions shown, 18 images · non-contrast
Comparison: CT abdomen and pelvis 06/26/2017

CLINICAL DATA: Abdominal pain



[Series 2: axial st · axial · 0.76mm/px · z∈[+953,+1353]mm · 13 of 94 slices shown, 15 images]
[im 7/94  soft-tissue]
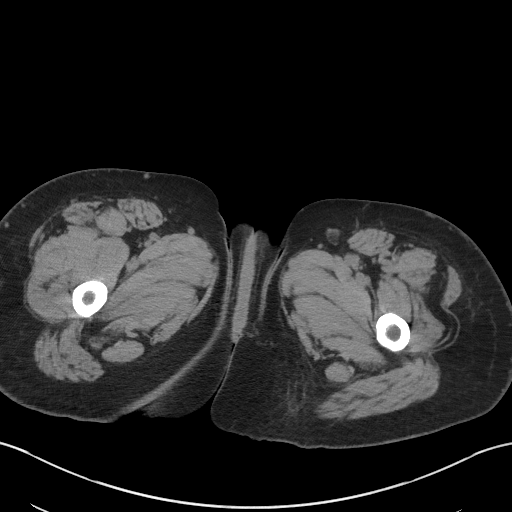
[im 7/94  bone]
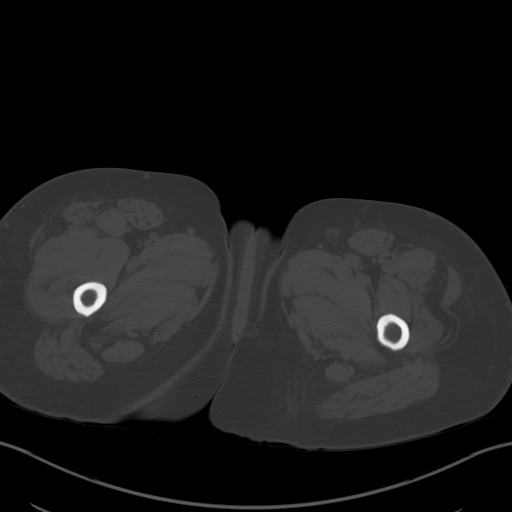
[im 13/94  soft-tissue]
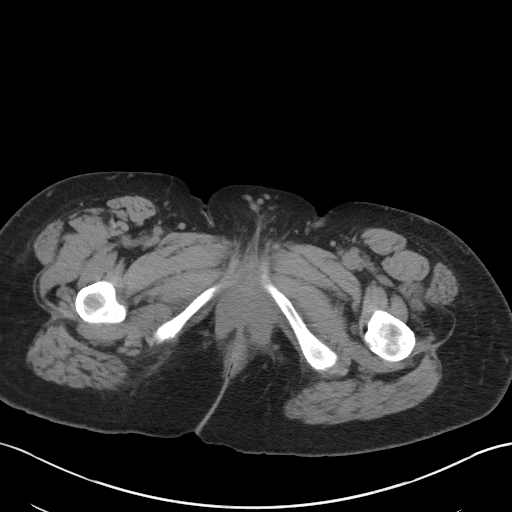
[im 19/94  soft-tissue]
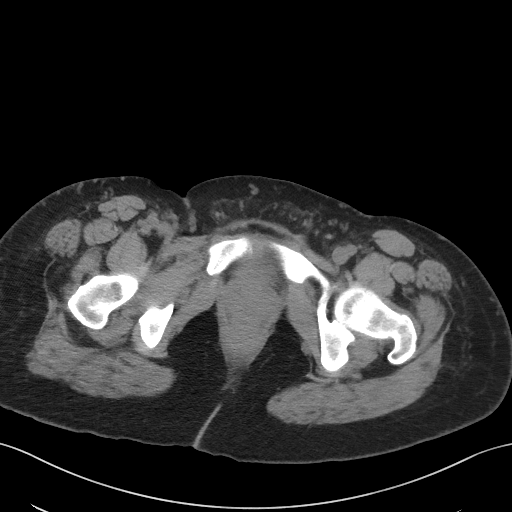
[im 25/94  soft-tissue]
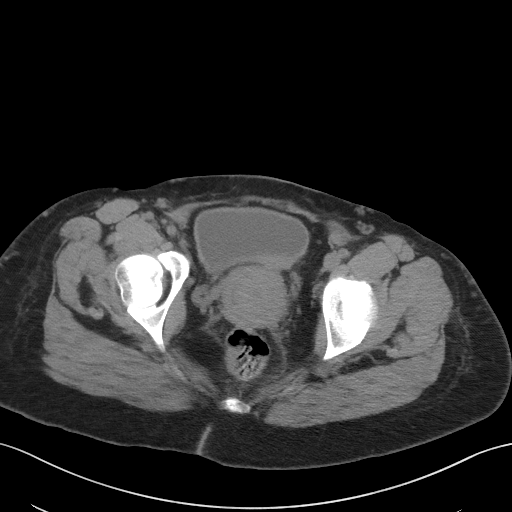
[im 32/94  soft-tissue]
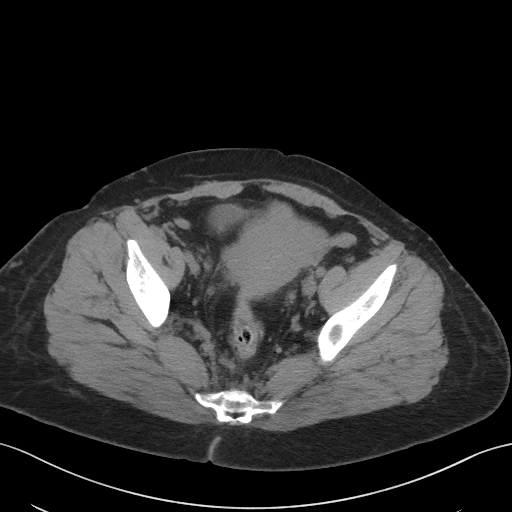
[im 38/94  soft-tissue]
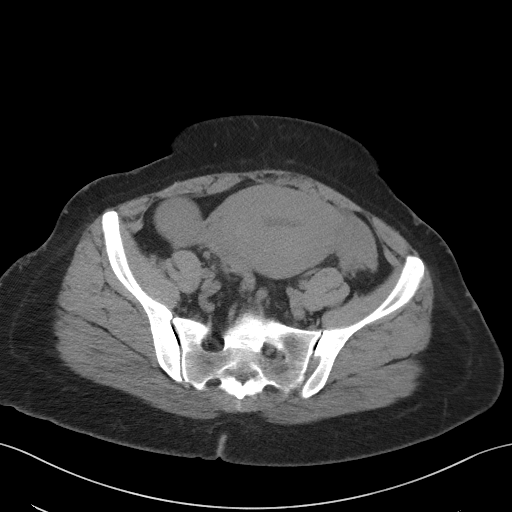
[im 50/94  soft-tissue]
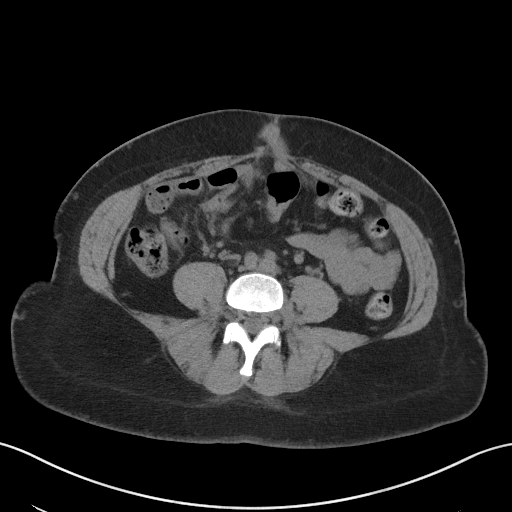
[im 56/94  soft-tissue]
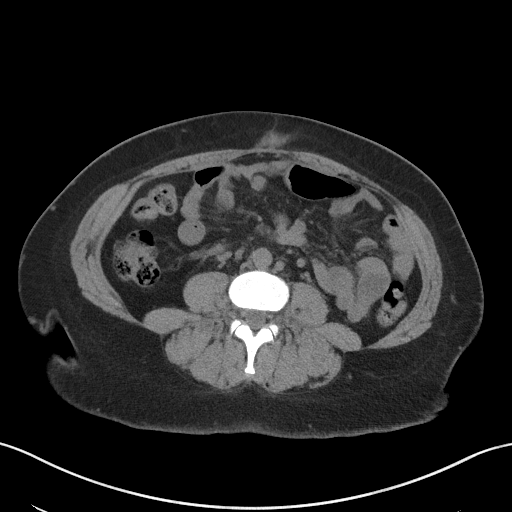
[im 63/94  soft-tissue]
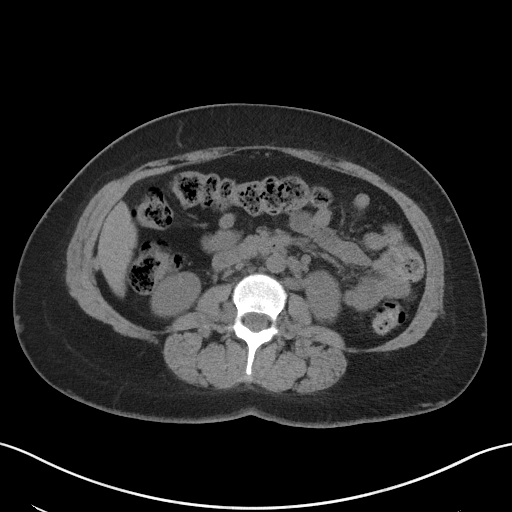
[im 63/94  bone]
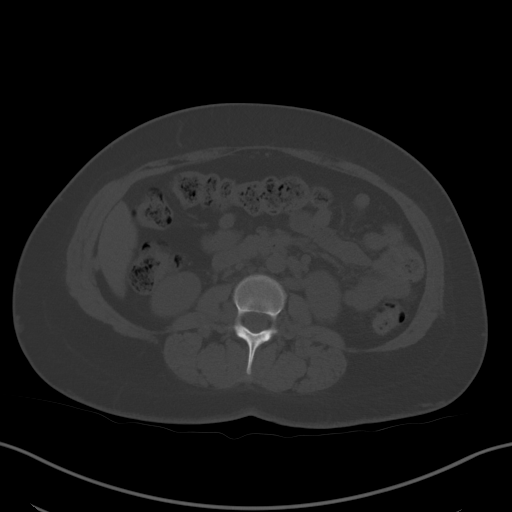
[im 69/94  soft-tissue]
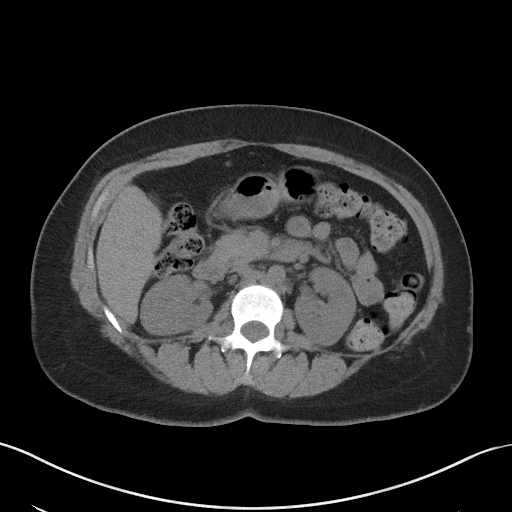
[im 75/94  soft-tissue]
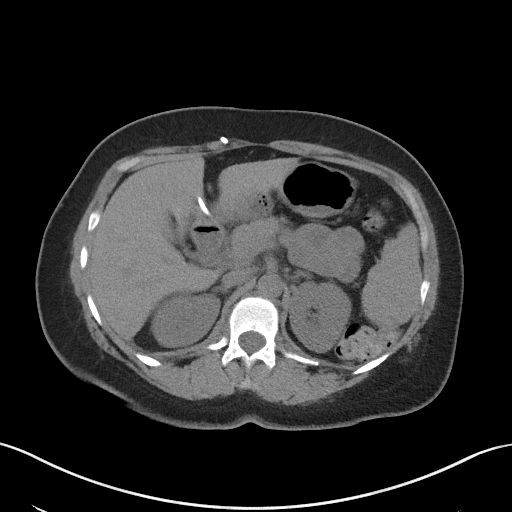
[im 81/94  soft-tissue]
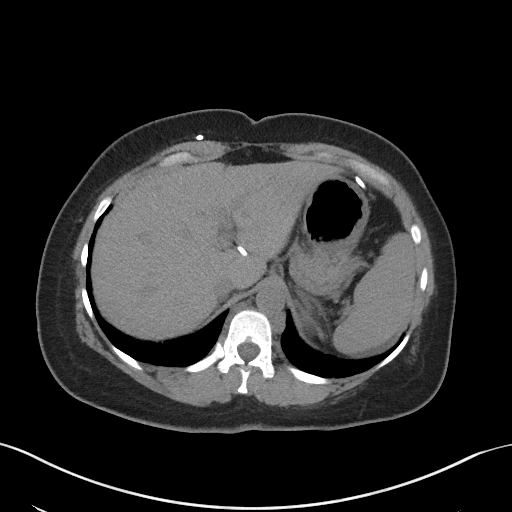
[im 87/94  soft-tissue]
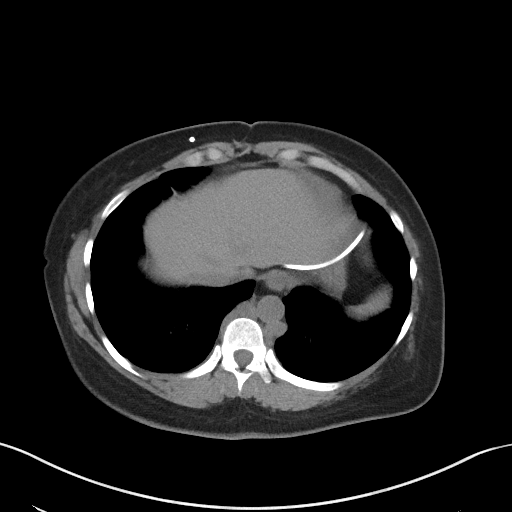

[Series 5: coronal st · coronal · 0.76mm/px · 3 of 96 slices shown]
[im 32/96  soft-tissue]
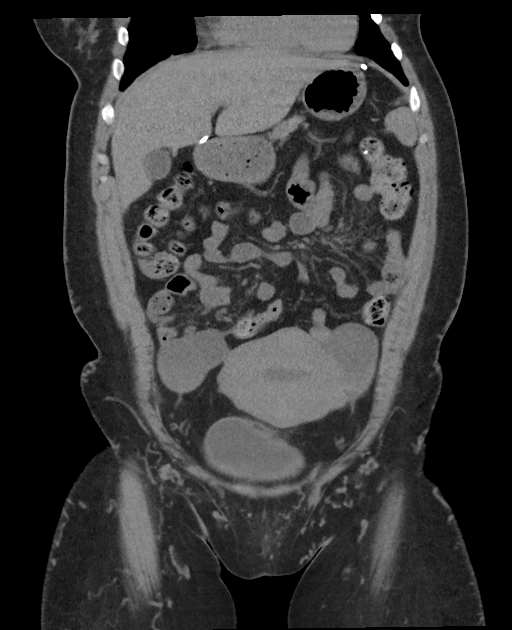
[im 43/96  soft-tissue]
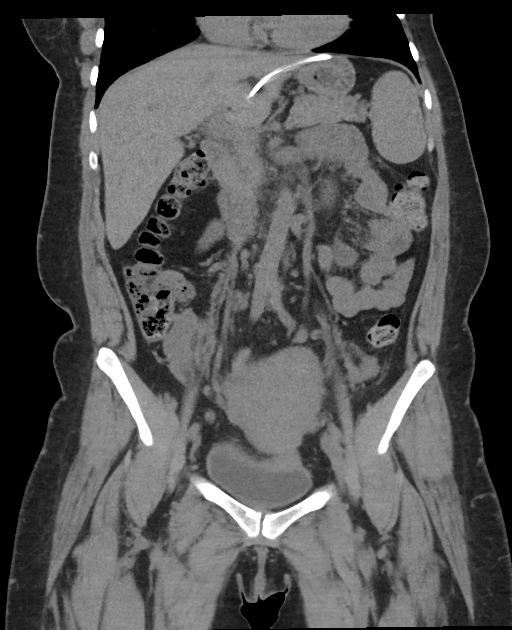
[im 53/96  soft-tissue]
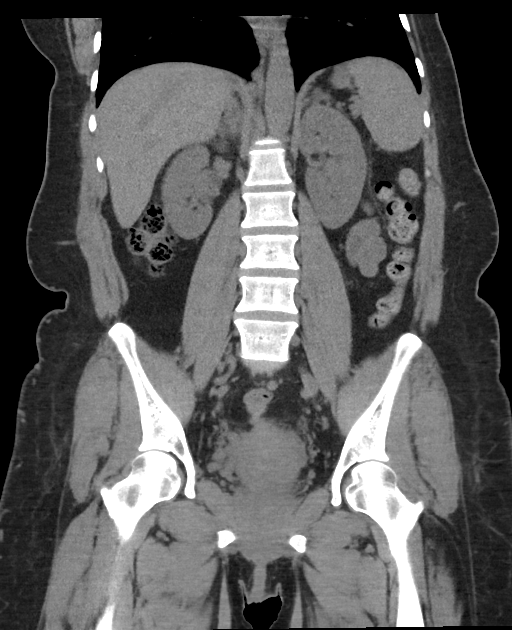

[16 of 46 positions shown; findings below may reference images not displayed]

FINDINGS: Lower chest: No acute abnormality.

Hepatobiliary: Liver is normal in size and contour. A few small
hepatic hypodense likely cysts are again seen measuring up to 9 mm
near the dome of the right hepatic lobe. No suspicious hepatic mass
identified. Gallbladder appears normal. No biliary ductal
dilatation.

Pancreas: Unremarkable. No pancreatic ductal dilatation or
surrounding inflammatory changes.

Spleen: Normal in size without focal abnormality.

Adrenals/Urinary Tract: Adrenal glands appear normal. No
nephrolithiasis or hydronephrosis identified bilaterally. Two
hypodense cysts identified in the left kidney measuring up to
cm. Urinary bladder appears normal.

Stomach/Bowel: No bowel obstruction, free air or pneumatosis. No
bowel wall edema identified. Loops of small bowel are noted within
an umbilical hernia. No evidence of acute appendicitis.

Vascular/Lymphatic: Chronic atresia of the distal IVC again noted
with associated collateral vessels in the pelvis and upper lower
extremities. Abdominal aorta normal caliber. No bulky
lymphadenopathy visualized.

Reproductive: Uterus is mildly lobulated with evidence of fibroids.
Bilateral adnexal/ovarian cystic lesions measuring up to 4.3 cm on
the right and 4.7 cm on the left.

Other: No ascites. Ventriculoperitoneal shunt catheter again seen in
stable position with the tip near the stomach greater curvature.
Umbilical hernia containing fat and small bowel, the defect measures
3.2 x 2.8 cm.

Musculoskeletal: No acute or significant osseous findings.
IMPRESSION: 1. No acute process identified.
2. Umbilical hernia containing fat and unobstructed small bowel.
3. Bilateral ovarian/adnexal cysts measuring up to 4.7 cm on the
left, no follow-up imaging required.
4. Other chronic findings as described.

## 2023-06-24 ENCOUNTER — Inpatient Hospital Stay: Payer: Medicaid Other | Admitting: Internal Medicine

## 2023-06-24 ENCOUNTER — Inpatient Hospital Stay: Payer: Medicaid Other | Attending: Internal Medicine

## 2023-06-24 ENCOUNTER — Encounter: Payer: Self-pay | Admitting: Internal Medicine

## 2023-10-11 ENCOUNTER — Ambulatory Visit (INDEPENDENT_AMBULATORY_CARE_PROVIDER_SITE_OTHER): Payer: Self-pay | Admitting: Adult Health

## 2023-10-11 ENCOUNTER — Encounter: Payer: Self-pay | Admitting: Adult Health

## 2023-10-11 VITALS — BP 138/84 | HR 81 | Ht 61.0 in | Wt 162.0 lb

## 2023-10-11 DIAGNOSIS — Z01419 Encounter for gynecological examination (general) (routine) without abnormal findings: Secondary | ICD-10-CM

## 2023-10-11 DIAGNOSIS — Z1211 Encounter for screening for malignant neoplasm of colon: Secondary | ICD-10-CM

## 2023-10-11 DIAGNOSIS — Z1331 Encounter for screening for depression: Secondary | ICD-10-CM

## 2023-10-11 DIAGNOSIS — I10 Essential (primary) hypertension: Secondary | ICD-10-CM

## 2023-10-11 LAB — HEMOCCULT GUIAC POC 1CARD (OFFICE): Fecal Occult Blood, POC: NEGATIVE

## 2023-10-11 MED ORDER — LOSARTAN POTASSIUM 25 MG PO TABS: ORAL_TABLET | ORAL | 4 refills | Status: DC

## 2023-10-11 NOTE — Progress Notes (Signed)
Patient ID: Sarah Cross, female   DOB: [birth year], 42 y.o.   MRN: [id number] History of Present Illness: Sarah Cross is a 42 year old black female, divorced, G2P1102 in for a well woman gyn exam, she is self pay. She is currently taking taking Eliquis, lost medicaid, has called MD, gave GoodRx card to try(has hx DVT).  Has moved to [place].      Component Value Date/Time   DIAGPAP  10/01/2021 1141    - Negative for intraepithelial lesion or malignancy (NILM)   DIAGPAP  08/05/2018 0000    NEGATIVE FOR INTRAEPITHELIAL LESIONS OR MALIGNANCY.   HPVHIGH Negative 10/01/2021 1141   ADEQPAP  10/01/2021 1141    Satisfactory for evaluation; transformation zone component ABSENT.   ADEQPAP  08/05/2018 0000    Satisfactory for evaluation  endocervical/transformation zone component ABSENT.    PCP is Dr Phillips Odor   Current Medications, Allergies, Past Medical History, Past Surgical History, Family History and Social History were reviewed in [employer] electronic medical record.     Review of Systems: Patient denies any headaches, hearing loss, fatigue, blurred vision, shortness of breath, chest pain, abdominal pain, problems with bowel movements, urination, or intercourse.(Not currently active). No joint pain or mood swings.     Physical Exam:BP 138/84 (BP Location: Left Arm, Patient Position: Sitting, Cuff Size: Normal)   Pulse 81   Ht 5\' 1"  (1.549 m)   Wt 162 lb (73.5 kg)   LMP 09/20/2023   BMI 30.61 kg/m   General:  Well developed, well nourished, no acute distress Skin:  Warm and dry Neck:  Midline trachea, normal thyroid, good ROM, no lymphadenopathy Lungs; Clear to auscultation bilaterally Breast:  No dominant palpable mass, retraction, or nipple discharge Cardiovascular: Regular rate and rhythm Abdomen:  Soft, non tender, no hepatosplenomegaly Pelvic:  External genitalia is normal in appearance, no lesions.  The vagina is normal in appearance. Urethra has no lesions or  masses. The cervix is smooth.  Uterus is felt to be normal size, shape, and contour.  No adnexal masses or tenderness noted.Bladder is non tender, no masses felt. Rectal: Good sphincter tone, no polyps, or hemorrhoids felt.  Hemoccult negative. Extremities/musculoskeletal:  No swelling or varicosities noted, no clubbing or cyanosis Psych:  No mood changes, alert and cooperative,seems happy AA is 0 Fall risk is low    10/11/2023    2:51 PM 10/08/2022    3:41 PM 10/01/2021   10:50 AM  Depression screen PHQ 2/9  Decreased Interest 1 0 0  Down, Depressed, Hopeless 1 0 0  PHQ - 2 Score 2 0 0  Altered sleeping 1 3 1   Tired, decreased energy 1 1 1   Change in appetite 0 0 0  Feeling bad or failure about yourself  0 0 0  Trouble concentrating 0 0 0  Moving slowly or fidgety/restless 0 0 0  Suicidal thoughts 0 0 0  PHQ-9 Score 4 4 2        10/11/2023    2:52 PM 10/08/2022    3:42 PM 10/01/2021   10:51 AM 08/07/2020    3:46 PM  GAD 7 : Generalized Anxiety Score  Nervous, Anxious, on Edge 0 0 0 0  Control/stop worrying 1 0 0 0  Worry too much - different things 1 0 0 0  Trouble relaxing 1 0 0 1  Restless 0 0 0 0  Easily annoyed or irritable 0 0 0 0  Afraid - awful might happen 0 0 0 0  Total  GAD 7 Score 3 0 0 1    Upstream - 10/11/23 1453       Pregnancy Intention Screening   Does the patient want to become pregnant in the next year? No    Does the patient's partner want to become pregnant in the next year? No    Would the patient like to discuss contraceptive options today? No      Contraception Wrap Up   Current Method No Contraceptive Precautions    End Method No Contraception Precautions    Contraception Counseling Provided No              Examination chaperoned by Malachy Mood LPN  Impression and plan: 1. Women's annual routine gynecological examination (Primary) Pap and physical in 1 year Labs with PCP Had negative mammogram 10/30/22 at [facility]   2.  Encounter for screening fecal occult blood testing Hemoccult was negative   3. Hypertension, unspecified type Taking cozaar she says Will refill cozaar 25 mg 1 daily Meds ordered this encounter  Medications   losartan (COZAAR) 25 MG tablet    Sig: TAKE 1 TABLET(25 MG) BY MOUTH DAILY    Dispense:  90 tablet    Refill:  4    Supervising Provider:   Duane Lope H [[id number]]

## 2023-11-10 ENCOUNTER — Telehealth: Payer: Self-pay | Admitting: Adult Health

## 2023-11-10 NOTE — Telephone Encounter (Signed)
Pt aware that she needs further imaging on mammogram, has appt 2/29/25 at [facility]

## 2024-05-11 ENCOUNTER — Ambulatory Visit (INDEPENDENT_AMBULATORY_CARE_PROVIDER_SITE_OTHER): Payer: Self-pay | Admitting: Adult Health

## 2024-05-11 ENCOUNTER — Encounter: Payer: Self-pay | Admitting: Adult Health

## 2024-05-11 VITALS — BP 156/90 | HR 61 | Ht 62.0 in | Wt 161.0 lb

## 2024-05-11 DIAGNOSIS — N926 Irregular menstruation, unspecified: Secondary | ICD-10-CM

## 2024-05-11 DIAGNOSIS — N951 Menopausal and female climacteric states: Secondary | ICD-10-CM

## 2024-05-11 DIAGNOSIS — Z3202 Encounter for pregnancy test, result negative: Secondary | ICD-10-CM

## 2024-05-11 DIAGNOSIS — R61 Generalized hyperhidrosis: Secondary | ICD-10-CM

## 2024-05-11 DIAGNOSIS — I10 Essential (primary) hypertension: Secondary | ICD-10-CM

## 2024-05-11 LAB — POCT URINE PREGNANCY: Preg Test, Ur: NEGATIVE

## 2024-05-11 NOTE — Progress Notes (Signed)
  Subjective:     Patient ID: Sarah Cross, female   DOB: [birth year], 43 y.o.   MRN: [id number]  HPI Sarah Cross is a 43 year old black female,married, G2P1002 in complaining of having missed a period, having occasional; night sweats and wakes up between 1 and 3, has had some nausea and cravings. She requests UPT.     Component Value Date/Time   DIAGPAP  10/01/2021 1141    - Negative for intraepithelial lesion or malignancy (NILM)   DIAGPAP  08/05/2018 0000    NEGATIVE FOR INTRAEPITHELIAL LESIONS OR MALIGNANCY.   HPVHIGH Negative 10/01/2021 1141   ADEQPAP  10/01/2021 1141    Satisfactory for evaluation; transformation zone component ABSENT.   ADEQPAP  08/05/2018 0000    Satisfactory for evaluation  endocervical/transformation zone component ABSENT.   PCP is Dr Sarah Cross   Review of Systems +missed a period,  +having occasional;  +night sweats  +wakes up between 1 and 3,  +has had some nausea and cravings.  Reviewed past medical,surgical, social and family history. Reviewed medications and allergies.     Objective:   Physical Exam BP (!) 156/90 (BP Location: Left Arm, Patient Position: Sitting, Cuff Size: Normal)   Pulse 61   Ht 5' 2 (1.575 m)   Wt 161 lb (73 kg)   LMP 03/18/2024 (Exact Date)   BMI 29.45 kg/m     UPT is negative Skin warm and dry. Lungs: clear to ausculation bilaterally. Cardiovascular: regular rate and rhythm. Fall risk is low  Upstream - 05/11/24 1611       Pregnancy Intention Screening   Does the patient want to become pregnant in the next year? Yes    Does the patient's partner want to become pregnant in the next year? Yes    Would the patient like to discuss contraceptive options today? No      Contraception Wrap Up   Current Method Pregnant/Seeking Pregnancy    End Method Pregnant/Seeking Pregnancy    Contraception Counseling Provided No           Assessment:     1. Negative pregnancy test - POCT urine pregnancy  2. Missed  periods (Primary) Missed period, UPT negative If not period by end of September let me now  3. Night sweats Occasional sweats   4. Perimenopause Discussed this could be beginning of perimenopause Review handout   5. Hypertension, unspecified type Take BP meds and follow up with PCP    Plan:     Return in 5 months for Pap and physical

## 2024-11-09 ENCOUNTER — Ambulatory Visit: Payer: Self-pay | Admitting: Adult Health

## 2025-01-10 ENCOUNTER — Ambulatory Visit: Payer: Self-pay | Admitting: Adult Health
